# Patient Record
Sex: Female | Born: 1950 | Race: White | Hispanic: No | Marital: Single | Smoking: Never smoker
Health system: Southern US, Community
[De-identification: ages and names within clinical notes are randomized; demographics above are authoritative.]

## PROBLEM LIST (undated history)

## (undated) DIAGNOSIS — T753XXA Motion sickness, initial encounter: Secondary | ICD-10-CM

## (undated) DIAGNOSIS — K219 Gastro-esophageal reflux disease without esophagitis: Secondary | ICD-10-CM

## (undated) DIAGNOSIS — M722 Plantar fascial fibromatosis: Secondary | ICD-10-CM

## (undated) DIAGNOSIS — R519 Headache, unspecified: Secondary | ICD-10-CM

## (undated) DIAGNOSIS — R569 Unspecified convulsions: Secondary | ICD-10-CM

## (undated) DIAGNOSIS — F32A Depression, unspecified: Secondary | ICD-10-CM

## (undated) DIAGNOSIS — F329 Major depressive disorder, single episode, unspecified: Secondary | ICD-10-CM

## (undated) DIAGNOSIS — R42 Dizziness and giddiness: Secondary | ICD-10-CM

## (undated) DIAGNOSIS — F419 Anxiety disorder, unspecified: Secondary | ICD-10-CM

## (undated) DIAGNOSIS — I10 Essential (primary) hypertension: Secondary | ICD-10-CM

## (undated) DIAGNOSIS — R002 Palpitations: Secondary | ICD-10-CM

## (undated) DIAGNOSIS — M1990 Unspecified osteoarthritis, unspecified site: Secondary | ICD-10-CM

## (undated) HISTORY — DX: Palpitations: R00.2

## (undated) HISTORY — PX: TUBAL LIGATION: SHX77

## (undated) HISTORY — PX: BREAST REDUCTION SURGERY: SHX8

## (undated) HISTORY — DX: Essential (primary) hypertension: I10

## (undated) HISTORY — PX: REDUCTION MAMMAPLASTY: SUR839

## (undated) HISTORY — DX: Unspecified convulsions: R56.9

---

## 2018-12-11 ENCOUNTER — Other Ambulatory Visit: Payer: Self-pay

## 2018-12-11 ENCOUNTER — Encounter (HOSPITAL_COMMUNITY): Payer: Self-pay | Admitting: *Deleted

## 2018-12-11 ENCOUNTER — Emergency Department (HOSPITAL_COMMUNITY)
Admission: EM | Admit: 2018-12-11 | Discharge: 2018-12-11 | Disposition: A | Discharge status: Home / Self Care | Payer: Medicare HMO | Attending: Emergency Medicine | Admitting: Emergency Medicine

## 2018-12-11 DIAGNOSIS — F419 Anxiety disorder, unspecified: Secondary | ICD-10-CM | POA: Diagnosis not present

## 2018-12-11 DIAGNOSIS — F329 Major depressive disorder, single episode, unspecified: Secondary | ICD-10-CM | POA: Diagnosis not present

## 2018-12-11 DIAGNOSIS — R21 Rash and other nonspecific skin eruption: Secondary | ICD-10-CM | POA: Diagnosis present

## 2018-12-11 HISTORY — DX: Dizziness and giddiness: R42

## 2018-12-11 HISTORY — DX: Major depressive disorder, single episode, unspecified: F32.9

## 2018-12-11 HISTORY — DX: Plantar fascial fibromatosis: M72.2

## 2018-12-11 HISTORY — DX: Depression, unspecified: F32.A

## 2018-12-11 HISTORY — DX: Motion sickness, initial encounter: T75.3XXA

## 2018-12-11 HISTORY — DX: Anxiety disorder, unspecified: F41.9

## 2018-12-11 MED ORDER — HYDROXYZINE HCL 25 MG PO TABS: 25.0000 mg | ORAL_TABLET | Freq: Four times a day (QID) | ORAL | 0 refills | Status: DC

## 2018-12-11 MED ORDER — DEXAMETHASONE SODIUM PHOSPHATE 10 MG/ML IJ SOLN
10.0000 mg | Freq: Once | INTRAMUSCULAR | Status: AC
  Administered 2018-12-11: 10 mg via INTRAMUSCULAR
  Filled 2018-12-11: qty 1

## 2018-12-11 NOTE — Discharge Instructions (Signed)
Take Hydroxyzine as needed for itching Please follow up with primary care doctor

## 2018-12-11 NOTE — ED Triage Notes (Signed)
Pt c/o itchy, bumpy rash on buttocks x 1 week. Pt reports the rash started behind her right ear, went to her neck, then her finger, then her stomach and now on her buttocks. Pt has used Hydrocortisone and Benadryl which helps the itch "a little".

## 2018-12-11 NOTE — ED Provider Notes (Signed)
[facility] EMERGENCY DEPARTMENT Provider Note   CSN: [id number] Arrival date & time: 12/11/18  1511     History   Chief Complaint Chief Complaint  Patient presents with  . Rash    HPI Kathryn Compton is a 68 y.o. female who presents with a rash.  No significant past medical history.  The patient states that she has been in [place] for the past several months and is considering moving here from [place].  She states that for the past week she has developed a rash on various areas of her body.  For started behind her right ear and then spread to her left ear, her left hand, and now is a worse on her left buttock.  The rash is very itchy.  She tried over-the-counter topical medicines which provided mild temporary relief however the itching soon return.  She diet denies any fever or pain.  She is never had this before.  She is unsure of any exposures to possible allergens.  HPI  Past Medical History:  Diagnosis Date  . Anxiety   . Depression   . Motion sickness   . Plantar fasciitis   . Vertigo     There are no active problems to display for this patient.   Past Surgical History:  Procedure Laterality Date  . BREAST REDUCTION SURGERY       OB History   No obstetric history on file.      Home Medications    Prior to Admission medications   Medication Sig Start Date End Date Taking? Authorizing Provider  hydrOXYzine (ATARAX/VISTARIL) 25 MG tablet Take 1 tablet (25 mg total) by mouth every 6 (six) hours. 12/11/18   Recardo Evangelist, PA-C    Family History No family history on file.  Social History Social History   Tobacco Use  . Smoking status: Never Smoker  . Smokeless tobacco: Never Used  Substance Use Topics  . Alcohol use: Never    Frequency: Never  . Drug use: Never     Allergies   Other   Review of Systems Review of Systems  Constitutional: Negative for fever.  Skin: Positive for rash.     Physical Exam Updated Vital Signs BP (!)  159/83 (BP Location: Right Arm)   Pulse 77   Temp 97.9 F (36.6 C) (Oral)   Resp 20   Ht 5\' 7"  (1.702 m)   Wt 114.9 kg   SpO2 95%   BMI 39.69 kg/m   Physical Exam Vitals signs and nursing note reviewed.  Constitutional:      General: She is not in acute distress.    Appearance: She is well-developed. She is obese.     Comments: Calm and cooperative. Scratching frequently  HENT:     Head: Normocephalic and atraumatic.  Eyes:     General: No scleral icterus.       Right eye: No discharge.        Left eye: No discharge.     Conjunctiva/sclera: Conjunctivae normal.     Pupils: Pupils are equal, round, and reactive to light.  Neck:     Musculoskeletal: Normal range of motion.  Cardiovascular:     Rate and Rhythm: Normal rate.  Pulmonary:     Effort: Pulmonary effort is normal. No respiratory distress.  Abdominal:     General: There is no distension.  Skin:    General: Skin is warm and dry.     Findings: Rash (Erythematous, raised areas behind the right  ear, over the left middle finger, over the buttocks) present.  Neurological:     Mental Status: She is alert and oriented to person, place, and time.  Psychiatric:        Behavior: Behavior normal.      ED Treatments / Results  Labs (all labs ordered are listed, but only abnormal results are displayed) Labs Reviewed - No data to display  EKG None  Radiology No results found.  Procedures Procedures (including critical care time)  Medications Ordered in ED Medications  dexamethasone (DECADRON) injection 10 mg (has no administration in time range)     Initial Impression / Assessment and Plan / ED Course  I have reviewed the triage vital signs and the nursing notes.  Pertinent labs & imaging results that were available during my care of the patient were reviewed by me and considered in my medical decision making (see chart for details).  68 year old female with nonspecific rash for the past week.  It is itchy  and consistent with a contact dermatitis.  She is hypertensive but otherwise vital signs are normal.  She is well-appearing.  She got some relief with over-the-counter steroids therefore we will give her IM injection of Decadron today and prescription for Atarax.  She was encouraged to establish care with a primary care physician.  Final Clinical Impressions(s) / ED Diagnoses   Final diagnoses:  Rash and nonspecific skin eruption    ED Discharge Orders         Ordered    hydrOXYzine (ATARAX/VISTARIL) 25 MG tablet  Every 6 hours     12/11/18 1650           Recardo Evangelist, PA-C 12/11/18 1655    Nat Christen, MD 12/11/18 2316

## 2019-05-21 DIAGNOSIS — Z0100 Encounter for examination of eyes and vision without abnormal findings: Secondary | ICD-10-CM | POA: Diagnosis not present

## 2019-05-21 DIAGNOSIS — H5200 Hypermetropia, unspecified eye: Secondary | ICD-10-CM | POA: Diagnosis not present

## 2019-05-24 DIAGNOSIS — M545 Low back pain: Secondary | ICD-10-CM | POA: Diagnosis not present

## 2019-05-24 DIAGNOSIS — M9905 Segmental and somatic dysfunction of pelvic region: Secondary | ICD-10-CM | POA: Diagnosis not present

## 2019-05-24 DIAGNOSIS — M9903 Segmental and somatic dysfunction of lumbar region: Secondary | ICD-10-CM | POA: Diagnosis not present

## 2019-05-24 DIAGNOSIS — M9902 Segmental and somatic dysfunction of thoracic region: Secondary | ICD-10-CM | POA: Diagnosis not present

## 2019-05-26 DIAGNOSIS — M9905 Segmental and somatic dysfunction of pelvic region: Secondary | ICD-10-CM | POA: Diagnosis not present

## 2019-05-26 DIAGNOSIS — M9902 Segmental and somatic dysfunction of thoracic region: Secondary | ICD-10-CM | POA: Diagnosis not present

## 2019-05-26 DIAGNOSIS — M545 Low back pain: Secondary | ICD-10-CM | POA: Diagnosis not present

## 2019-05-26 DIAGNOSIS — M9903 Segmental and somatic dysfunction of lumbar region: Secondary | ICD-10-CM | POA: Diagnosis not present

## 2019-05-31 DIAGNOSIS — M9903 Segmental and somatic dysfunction of lumbar region: Secondary | ICD-10-CM | POA: Diagnosis not present

## 2019-05-31 DIAGNOSIS — M545 Low back pain: Secondary | ICD-10-CM | POA: Diagnosis not present

## 2019-05-31 DIAGNOSIS — M9902 Segmental and somatic dysfunction of thoracic region: Secondary | ICD-10-CM | POA: Diagnosis not present

## 2019-05-31 DIAGNOSIS — M9905 Segmental and somatic dysfunction of pelvic region: Secondary | ICD-10-CM | POA: Diagnosis not present

## 2019-06-07 DIAGNOSIS — M9903 Segmental and somatic dysfunction of lumbar region: Secondary | ICD-10-CM | POA: Diagnosis not present

## 2019-06-07 DIAGNOSIS — M9902 Segmental and somatic dysfunction of thoracic region: Secondary | ICD-10-CM | POA: Diagnosis not present

## 2019-06-07 DIAGNOSIS — M9905 Segmental and somatic dysfunction of pelvic region: Secondary | ICD-10-CM | POA: Diagnosis not present

## 2019-06-07 DIAGNOSIS — M545 Low back pain: Secondary | ICD-10-CM | POA: Diagnosis not present

## 2019-06-17 DIAGNOSIS — I10 Essential (primary) hypertension: Secondary | ICD-10-CM | POA: Diagnosis not present

## 2019-06-17 DIAGNOSIS — J309 Allergic rhinitis, unspecified: Secondary | ICD-10-CM | POA: Diagnosis not present

## 2019-06-29 DIAGNOSIS — R7309 Other abnormal glucose: Secondary | ICD-10-CM | POA: Diagnosis not present

## 2019-06-29 DIAGNOSIS — I10 Essential (primary) hypertension: Secondary | ICD-10-CM | POA: Diagnosis not present

## 2019-06-29 DIAGNOSIS — Z0001 Encounter for general adult medical examination with abnormal findings: Secondary | ICD-10-CM | POA: Diagnosis not present

## 2019-07-07 ENCOUNTER — Other Ambulatory Visit (HOSPITAL_COMMUNITY): Payer: Self-pay | Admitting: Internal Medicine

## 2019-07-07 DIAGNOSIS — Z1389 Encounter for screening for other disorder: Secondary | ICD-10-CM | POA: Diagnosis not present

## 2019-07-07 DIAGNOSIS — Z0001 Encounter for general adult medical examination with abnormal findings: Secondary | ICD-10-CM | POA: Diagnosis not present

## 2019-07-07 DIAGNOSIS — I10 Essential (primary) hypertension: Secondary | ICD-10-CM | POA: Diagnosis not present

## 2019-07-07 DIAGNOSIS — Z1331 Encounter for screening for depression: Secondary | ICD-10-CM | POA: Diagnosis not present

## 2019-07-07 DIAGNOSIS — Z1231 Encounter for screening mammogram for malignant neoplasm of breast: Secondary | ICD-10-CM

## 2019-07-08 ENCOUNTER — Other Ambulatory Visit (HOSPITAL_COMMUNITY): Payer: Self-pay | Admitting: Internal Medicine

## 2019-07-08 DIAGNOSIS — Z780 Asymptomatic menopausal state: Secondary | ICD-10-CM

## 2019-07-15 ENCOUNTER — Encounter: Payer: Self-pay | Admitting: *Deleted

## 2019-07-16 ENCOUNTER — Ambulatory Visit (HOSPITAL_COMMUNITY)
Admission: RE | Admit: 2019-07-16 | Discharge: 2019-07-16 | Disposition: A | Discharge status: Home / Self Care | Payer: Medicare HMO | Source: Ambulatory Visit | Attending: Internal Medicine | Admitting: Internal Medicine

## 2019-07-16 ENCOUNTER — Inpatient Hospital Stay (HOSPITAL_COMMUNITY): Admission: RE | Admit: 2019-07-16 | Payer: Medicare HMO | Source: Ambulatory Visit

## 2019-07-16 ENCOUNTER — Ambulatory Visit (HOSPITAL_COMMUNITY): Payer: Medicare HMO

## 2019-07-16 ENCOUNTER — Other Ambulatory Visit (HOSPITAL_COMMUNITY): Payer: Medicare HMO

## 2019-07-16 ENCOUNTER — Encounter (HOSPITAL_COMMUNITY): Payer: Self-pay

## 2019-07-16 ENCOUNTER — Other Ambulatory Visit: Payer: Self-pay

## 2019-07-16 DIAGNOSIS — Z1231 Encounter for screening mammogram for malignant neoplasm of breast: Secondary | ICD-10-CM | POA: Diagnosis not present

## 2019-07-16 DIAGNOSIS — Z780 Asymptomatic menopausal state: Secondary | ICD-10-CM | POA: Insufficient documentation

## 2019-07-16 DIAGNOSIS — M85851 Other specified disorders of bone density and structure, right thigh: Secondary | ICD-10-CM | POA: Diagnosis not present

## 2019-07-16 DIAGNOSIS — I10 Essential (primary) hypertension: Secondary | ICD-10-CM | POA: Diagnosis not present

## 2019-07-16 DIAGNOSIS — B009 Herpesviral infection, unspecified: Secondary | ICD-10-CM | POA: Diagnosis not present

## 2019-07-27 ENCOUNTER — Other Ambulatory Visit: Payer: Self-pay

## 2019-07-27 ENCOUNTER — Ambulatory Visit (INDEPENDENT_AMBULATORY_CARE_PROVIDER_SITE_OTHER): Payer: Medicare HMO | Admitting: *Deleted

## 2019-07-27 DIAGNOSIS — Z1211 Encounter for screening for malignant neoplasm of colon: Secondary | ICD-10-CM

## 2019-07-27 MED ORDER — PEG 3350-KCL-NA BICARB-NACL 420 G PO SOLR: 4000.0000 mL | Freq: Once | ORAL | 0 refills | Status: AC

## 2019-07-27 NOTE — Patient Instructions (Signed)
Makisha Marrin   [birth year] MRN: [id number]    Procedure Date: 09/06/2019 Time to register: 1:00 pm Place to register: [facility] Procedure Time: 2:00 pm Scheduled provider: Dr. Oneida Alar  PREPARATION FOR COLONOSCOPY WITH TRI-LYTE SPLIT PREP  Please notify us immediately if you are diabetic, take iron supplements, or if you are on Coumadin or any other blood thinners.   You will need to purchase 1 fleet enema and 1 box of Bisacodyl 30m tablets.  1 DAY BEFORE PROCEDURE:  DATE: 09/05/2019   DAY: Sunday Continue clear liquids the entire day - NO SOLID FOOD.    At 2:00 pm:  Take 2 Bisacodyl tablets.   At 4:00pm:  Start drinking your solution. Make sure you mix well per instructions on the bottle. Try to drink 1 (one) 8 ounce glass every 10-15 minutes until you have consumed HALF the jug. You should complete by 6:00pm.You must keep the left over solution refrigerated until completed next day.  Continue clear liquids. You must drink plenty of clear liquids to prevent dehyration and kidney failure.     DAY OF PROCEDURE:   DATE: 09/06/2019   DAY: Monday If you take medications for your heart, blood pressure or breathing, you may take these medications.   Five hours before your procedure time @ 9:00 am:  Finish remaining amout of bowel prep, drinking 1 (one) 8 ounce glass every 10-15 minutes until complete. You have two hours to consume remaining prep.   Three hours before your procedure time @ 11:00 am:  Nothing by mouth.   At least one hour before going to the hospital:  Give yourself one Fleet enema. You may take your morning medications with sip of water unless we have instructed otherwise.      Please see below for Dietary Information.  CLEAR LIQUIDS INCLUDE:  Water Jello (NOT red in color)   Ice Popsicles (NOT red in color)   Tea (sugar ok, no milk/cream) Powdered fruit flavored drinks  Coffee (sugar ok, no milk/cream) Gatorade/ Lemonade/ Kool-Aid  (NOT red in  color)   Juice: apple, white grape, white cranberry Soft drinks  Clear bullion, consomme, broth (fat free beef/chicken/vegetable)  Carbonated beverages (any kind)  Strained chicken noodle soup Hard Candy   Remember: Clear liquids are liquids that will allow you to see your fingers on the other side of a clear glass. Be sure liquids are NOT red in color, and not cloudy, but CLEAR.  DO NOT EAT OR DRINK ANY OF THE FOLLOWING:  Dairy products of any kind   Cranberry juice Tomato juice / V8 juice   Grapefruit juice Orange juice     Red grape juice  Do not eat any solid foods, including such foods as: cereal, oatmeal, yogurt, fruits, vegetables, creamed soups, eggs, bread, crackers, pureed foods in a blender, etc.   HELPFUL HINTS FOR DRINKING PREP SOLUTION:   Make sure prep is extremely cold. Mix and refrigerate the the morning of the prep. You may also put in the freezer.   You may try mixing some Crystal Light or Country Time Lemonade if you prefer. Mix in small amounts; add more if necessary.  Try drinking through a straw  Rinse mouth with water or a mouthwash between glasses, to remove after-taste.  Try sipping on a cold beverage /ice/ popsicles between glasses of prep.  Place a piece of sugar-free hard candy in mouth between glasses.  If you become nauseated, try consuming smaller amounts, or stretch out the time  between glasses. Stop for 30-60 minutes, then slowly start back drinking.        OTHER INSTRUCTIONS  You will need a responsible adult at least 66 years of age to accompany you and drive you home. This person must remain in the waiting room during your procedure. The hospital will cancel your procedure if you do not have a responsible adult with you.   1. Wear loose fitting clothing that is easily removed. 2. Leave jewelry and other valuables at home.  3. Remove all body piercing jewelry and leave at home. 4. Total time from sign-in until discharge is approximately  2-3 hours. 5. You should go home directly after your procedure and rest. You can resume normal activities the day after your procedure. 6. The day of your procedure you should not:  Drive  Make legal decisions  Operate machinery  Drink alcohol  Return to work   You may call the office (Dept: [phone]) before 5:00pm, or page the doctor on call [phone]) after 5:00pm, for further instructions, if necessary.   Insurance Information YOU WILL NEED TO CHECK WITH YOUR INSURANCE COMPANY FOR THE BENEFITS OF COVERAGE YOU HAVE FOR THIS PROCEDURE.  UNFORTUNATELY, NOT ALL INSURANCE COMPANIES HAVE BENEFITS TO COVER ALL OR PART OF THESE TYPES OF PROCEDURES.  IT IS YOUR RESPONSIBILITY TO CHECK YOUR BENEFITS, HOWEVER, WE WILL BE GLAD TO ASSIST YOU WITH ANY CODES YOUR INSURANCE COMPANY MAY NEED.    PLEASE NOTE THAT MOST INSURANCE COMPANIES WILL NOT COVER A SCREENING COLONOSCOPY FOR PEOPLE UNDER THE AGE OF 50  IF YOU HAVE BCBS INSURANCE, YOU MAY HAVE BENEFITS FOR A SCREENING COLONOSCOPY BUT IF POLYPS ARE FOUND THE DIAGNOSIS WILL CHANGE AND THEN YOU MAY HAVE A DEDUCTIBLE THAT WILL NEED TO BE MET. SO PLEASE MAKE SURE YOU CHECK YOUR BENEFITS FOR A SCREENING COLONOSCOPY AS WELL AS A DIAGNOSTIC COLONOSCOPY.

## 2019-07-27 NOTE — Progress Notes (Signed)
Gastroenterology Pre-Procedure Review  Request Date: 07/27/2019 Requesting Physician: Dr. Legrand Rams, No previous TCS  PATIENT REVIEW QUESTIONS: The patient responded to the following health history questions as indicated:    1. Diabetes Melitis: No 2. Joint replacements in the past 12 months: No 3. Major health problems in the past 3 months: No 4. Has an artificial valve or MVP: No 5. Has a defibrillator: No 6. Has been advised in past to take antibiotics in advance of a procedure like teeth cleaning: No 7. Family history of colon cancer: No  8. Alcohol Use: No 9. History of sleep apnea: No  10. History of coronary artery or other vascular stents placed within the last 12 months: No 11. History of any prior anesthesia complications: No    MEDICATIONS & ALLERGIES:    Patient reports the following regarding taking any blood thinners:   Plavix? No Aspirin? No Coumadin? No Brilinta? No Xarelto? No Eliquis? No Pradaxa? No Savaysa? No Effient? No  Patient confirms/reports the following medications:  Current Outpatient Medications  Medication Sig Dispense Refill  . cetirizine (ZYRTEC) 10 MG tablet Take 10 mg by mouth daily.    Marland Kitchen ibuprofen (ADVIL) 200 MG tablet Take 200 mg by mouth as needed.    . Melatonin 10 MG TABS Take by mouth at bedtime.    . Multiple Vitamins-Minerals (AIRBORNE PO) Take by mouth.     No current facility-administered medications for this visit.     Patient confirms/reports the following allergies:  Allergies  Allergen Reactions  . Other Itching and Other (See Comments)    Antidepressants-Itching, Gaining Weight     No orders of the defined types were placed in this encounter.   AUTHORIZATION INFORMATION Primary Insurance: Salli Quarry,  [place] #: [id number] Pre-Cert / Josem Kaufmann required: No, not required  SCHEDULE INFORMATION: Procedure has been scheduled as follows:  Date: 09/06/2019, Time: 2:00 Location: [facility] with Dr. Oneida Alar  This Gastroenterology  Pre-Precedure Review Form is being routed to the following provider(s): Neil Crouch, PA-C

## 2019-07-29 ENCOUNTER — Telehealth: Payer: Self-pay | Admitting: Advanced Practice Midwife

## 2019-07-29 NOTE — Telephone Encounter (Signed)

## 2019-07-30 ENCOUNTER — Encounter: Payer: Self-pay | Admitting: Advanced Practice Midwife

## 2019-07-30 ENCOUNTER — Other Ambulatory Visit (HOSPITAL_COMMUNITY)
Admission: RE | Admit: 2019-07-30 | Discharge: 2019-07-30 | Disposition: A | Discharge status: Home / Self Care | Payer: Medicare HMO | Source: Ambulatory Visit | Attending: Advanced Practice Midwife | Admitting: Advanced Practice Midwife

## 2019-07-30 ENCOUNTER — Ambulatory Visit (INDEPENDENT_AMBULATORY_CARE_PROVIDER_SITE_OTHER): Payer: Medicare HMO | Admitting: Advanced Practice Midwife

## 2019-07-30 ENCOUNTER — Other Ambulatory Visit: Payer: Self-pay

## 2019-07-30 VITALS — Ht 67.0 in | Wt 258.0 lb

## 2019-07-30 DIAGNOSIS — M545 Low back pain: Secondary | ICD-10-CM | POA: Diagnosis not present

## 2019-07-30 DIAGNOSIS — Z124 Encounter for screening for malignant neoplasm of cervix: Secondary | ICD-10-CM | POA: Insufficient documentation

## 2019-07-30 DIAGNOSIS — M9903 Segmental and somatic dysfunction of lumbar region: Secondary | ICD-10-CM | POA: Diagnosis not present

## 2019-07-30 DIAGNOSIS — M9905 Segmental and somatic dysfunction of pelvic region: Secondary | ICD-10-CM | POA: Diagnosis not present

## 2019-07-30 DIAGNOSIS — M9902 Segmental and somatic dysfunction of thoracic region: Secondary | ICD-10-CM | POA: Diagnosis not present

## 2019-07-30 DIAGNOSIS — Z780 Asymptomatic menopausal state: Secondary | ICD-10-CM | POA: Insufficient documentation

## 2019-07-30 DIAGNOSIS — Z1151 Encounter for screening for human papillomavirus (HPV): Secondary | ICD-10-CM | POA: Diagnosis not present

## 2019-07-30 NOTE — Progress Notes (Signed)
Ok to schedule.

## 2019-07-30 NOTE — Progress Notes (Signed)
[facility] Visit  Patient name: Kathryn Compton MRN [id number]  Date of birth: 1951-08-29  CC & HPI:  Kathryn Compton is a 68 y.o. Caucasian female presenting today for pap only.  Last pap 2014, normal. [facility] is PCP, did wellness visit but no pelvic  Pt getting colonoscopy in October. Mammogram this month was normal  Pertinent History Reviewed:  Medical & Surgical Hx:   Past Medical History:  Diagnosis Date  . Anxiety   . Depression   . Hypertension   . Motion sickness   . Plantar fasciitis   . Vertigo    Past Surgical History:  Procedure Laterality Date  . BREAST REDUCTION SURGERY    . REDUCTION MAMMAPLASTY    . TUBAL LIGATION     History reviewed. No pertinent family history.  Current Outpatient Medications:  .  cetirizine (ZYRTEC) 10 MG tablet, Take 10 mg by mouth daily., Disp: , Rfl:  .  Melatonin 10 MG TABS, Take by mouth at bedtime., Disp: , Rfl:  .  Multiple Vitamins-Minerals (AIRBORNE PO), Take by mouth., Disp: , Rfl:  .  ibuprofen (ADVIL) 200 MG tablet, Take 200 mg by mouth as needed., Disp: , Rfl:  Social History: Reviewed -  reports that she has never smoked. She has never used smokeless tobacco.  Review of Systems:   Constitutional: Negative for fever and chills Eyes: Negative for visual disturbances Respiratory: Negative for shortness of breath, dyspnea Cardiovascular: Negative for chest pain or palpitations  Gastrointestinal: Negative for vomiting, diarrhea and constipation; no abdominal pain Genitourinary: Negative for dysuria and urgency, vaginal irritation or itching Musculoskeletal: Negative for back pain, joint pain, myalgias  Neurological: Negative for dizziness and headaches    Objective Findings:    Physical Examination: There were no vitals filed for this visit. General appearance - well appearing, and in no distress Mental status - alert, oriented to person, place, and time Chest:  Normal respiratory effort Heart - normal  rate and regular rhythm Abdomen:  Soft, nontender Pelvic: SSE:  Normal appearing vagina, cervix.  Pap collected. Bimanual normal, no pain, ovaries not palpable. Musculoskeletal:  Normal range of motion without pain except for hip which is bothering her today.  Extremities:  No edema    No results found for this or any previous visit (from the past 24 hour(s)).    Assessment & Plan:  A:   Pap screening P:     Return in about 4 years (around 07/30/2023) for pap.  Christin Fudge CNM 07/30/2019 12:28 PM

## 2019-08-02 NOTE — Addendum Note (Signed)
Addended by: Metro Kung on: 08/02/2019 07:37 AM   Modules accepted: Orders, SmartSet

## 2019-08-06 LAB — CYTOLOGY - PAP
Diagnosis: NEGATIVE
HPV 16/18/45 genotyping: NEGATIVE
HPV: DETECTED — AB

## 2019-08-16 DIAGNOSIS — D235 Other benign neoplasm of skin of trunk: Secondary | ICD-10-CM | POA: Diagnosis not present

## 2019-08-16 DIAGNOSIS — B0089 Other herpesviral infection: Secondary | ICD-10-CM | POA: Diagnosis not present

## 2019-08-16 DIAGNOSIS — D3617 Benign neoplasm of peripheral nerves and autonomic nervous system of trunk, unspecified: Secondary | ICD-10-CM | POA: Diagnosis not present

## 2019-09-03 ENCOUNTER — Other Ambulatory Visit: Payer: Self-pay

## 2019-09-03 ENCOUNTER — Other Ambulatory Visit (HOSPITAL_COMMUNITY)
Admission: RE | Admit: 2019-09-03 | Discharge: 2019-09-03 | Disposition: A | Discharge status: Home / Self Care | Payer: Medicare HMO | Source: Ambulatory Visit | Attending: Gastroenterology | Admitting: Gastroenterology

## 2019-09-03 DIAGNOSIS — Z20828 Contact with and (suspected) exposure to other viral communicable diseases: Secondary | ICD-10-CM | POA: Insufficient documentation

## 2019-09-03 DIAGNOSIS — Z01812 Encounter for preprocedural laboratory examination: Secondary | ICD-10-CM | POA: Diagnosis not present

## 2019-09-03 LAB — SARS CORONAVIRUS 2 (TAT 6-24 HRS): SARS Coronavirus 2: NEGATIVE

## 2019-09-06 ENCOUNTER — Encounter (HOSPITAL_COMMUNITY)
Admission: RE | Disposition: A | Discharge status: Home / Self Care | Payer: Self-pay | Source: Home / Self Care | Attending: Gastroenterology

## 2019-09-06 ENCOUNTER — Ambulatory Visit (HOSPITAL_COMMUNITY)
Admission: RE | Admit: 2019-09-06 | Discharge: 2019-09-06 | Disposition: A | Discharge status: Home / Self Care | Payer: Medicare HMO | Attending: Gastroenterology | Admitting: Gastroenterology

## 2019-09-06 ENCOUNTER — Other Ambulatory Visit: Payer: Self-pay

## 2019-09-06 ENCOUNTER — Encounter (HOSPITAL_COMMUNITY): Payer: Self-pay | Admitting: *Deleted

## 2019-09-06 DIAGNOSIS — Q438 Other specified congenital malformations of intestine: Secondary | ICD-10-CM | POA: Diagnosis not present

## 2019-09-06 DIAGNOSIS — K635 Polyp of colon: Secondary | ICD-10-CM | POA: Diagnosis not present

## 2019-09-06 DIAGNOSIS — D125 Benign neoplasm of sigmoid colon: Secondary | ICD-10-CM | POA: Insufficient documentation

## 2019-09-06 DIAGNOSIS — K648 Other hemorrhoids: Secondary | ICD-10-CM | POA: Diagnosis not present

## 2019-09-06 DIAGNOSIS — K644 Residual hemorrhoidal skin tags: Secondary | ICD-10-CM | POA: Insufficient documentation

## 2019-09-06 DIAGNOSIS — Z1211 Encounter for screening for malignant neoplasm of colon: Secondary | ICD-10-CM | POA: Diagnosis not present

## 2019-09-06 DIAGNOSIS — I10 Essential (primary) hypertension: Secondary | ICD-10-CM | POA: Diagnosis not present

## 2019-09-06 HISTORY — PX: POLYPECTOMY: SHX5525

## 2019-09-06 HISTORY — PX: COLONOSCOPY: SHX5424

## 2019-09-06 SURGERY — COLONOSCOPY
Anesthesia: Moderate Sedation

## 2019-09-06 MED ORDER — MEPERIDINE HCL 100 MG/ML IJ SOLN
INTRAMUSCULAR | Status: AC
  Filled 2019-09-06: qty 2

## 2019-09-06 MED ORDER — SODIUM CHLORIDE 0.9 % IV SOLN
INTRAVENOUS | Status: DC
  Administered 2019-09-06: 13:00:00 via INTRAVENOUS

## 2019-09-06 MED ORDER — MIDAZOLAM HCL 5 MG/5ML IJ SOLN
INTRAMUSCULAR | Status: DC | PRN
  Administered 2019-09-06 (×3): 2 mg via INTRAVENOUS

## 2019-09-06 MED ORDER — STERILE WATER FOR IRRIGATION IR SOLN
Status: DC | PRN
  Administered 2019-09-06: 1.5 mL

## 2019-09-06 MED ORDER — MEPERIDINE HCL 100 MG/ML IJ SOLN
INTRAMUSCULAR | Status: DC | PRN
  Administered 2019-09-06 (×3): 25 mg via INTRAVENOUS

## 2019-09-06 MED ORDER — MIDAZOLAM HCL 5 MG/5ML IJ SOLN
INTRAMUSCULAR | Status: AC
  Filled 2019-09-06: qty 10

## 2019-09-06 NOTE — Discharge Instructions (Signed)
You have internal AND EXTERNAL hemorrhoids. YOU HAD ONE POLYP REMOVED.    CONTINUE YOUR WEIGHT LOSS EFFORTS. YOUR BODY MASS INDEX IS OVER 40 WHICH MEANS YOU ARE MORBIDLY OBESE. OBESITY IS ASSOCIATED WITH AN INCREASED FOR POLYPS, CIRRHOSIS AND ALL CANCERS, INCLUDING ESOPHAGEAL AND COLON CANCER  AND DECREASES YOUR LIFE EXPECTANCY 10 YEARS. I RECOMMEND YOU READ AND FOLLOW RECOMMENDATIONS BY DR. MARK HYMAN, "10-DAY DETOX DIET".  DRINK WATER TO KEEP YOUR URINE LIGHT YELLOW.  FOLLOW A HIGH FIBER DIET. AVOID ITEMS THAT CAUSE BLOATING. SEE INFO BELOW.   USE PREPARATION H FOUR TIMES  A DAY IF NEEDED TO RELIEVE RECTAL PAIN/PRESSURE/BLEEDING.   YOUR BIOPSY RESULTS WILL BE BACK IN 5 BUSINESS DAYS.  Next colonoscopy in 5-10 years.  Colonoscopy Care After Read the instructions outlined below and refer to this sheet in the next week. These discharge instructions provide you with general information on caring for yourself after you leave the hospital. While your treatment has been planned according to the most current medical practices available, unavoidable complications occasionally occur. If you have any problems or questions after discharge, call DR. Margarite Vessel, [phone].  ACTIVITY  You may resume your regular activity, but move at a slower pace for the next 24 hours.   Take frequent rest periods for the next 24 hours.   Walking will help get rid of the air and reduce the bloated feeling in your belly (abdomen).   No driving for 24 hours (because of the medicine (anesthesia) used during the test).   You may shower.   Do not sign any important legal documents or operate any machinery for 24 hours (because of the anesthesia used during the test).    NUTRITION  Drink plenty of fluids.   You may resume your normal diet as instructed by your doctor.   Begin with a light meal and progress to your normal diet. Heavy or fried foods are harder to digest and may make you feel sick to your  stomach (nauseated).   Avoid alcoholic beverages for 24 hours or as instructed.    MEDICATIONS  You may resume your normal medications.   WHAT YOU CAN EXPECT TODAY  Some feelings of bloating in the abdomen.   Passage of more gas than usual.   Spotting of blood in your stool or on the toilet paper  .  IF YOU HAD POLYPS REMOVED DURING THE COLONOSCOPY:  Eat a soft diet IF YOU HAVE NAUSEA, BLOATING, ABDOMINAL PAIN, OR VOMITING.    FINDING OUT THE RESULTS OF YOUR TEST Not all test results are available during your visit. DR. Oneida Alar WILL CALL YOU WITHIN 14 DAYS OF YOUR PROCEDUE WITH YOUR RESULTS. Do not assume everything is normal if you have not heard from DR. Shaylah Mcghie, CALL HER OFFICE AT [phone].  SEEK IMMEDIATE MEDICAL ATTENTION AND CALL THE OFFICE: [phone] IF:  You have more than a spotting of blood in your stool.   Your belly is swollen (abdominal distention).   You are nauseated or vomiting.   You have a temperature over 101F.   You have abdominal pain or discomfort that is severe or gets worse throughout the day.  High-Fiber Diet A high-fiber diet changes your normal diet to include more whole grains, legumes, fruits, and vegetables. Changes in the diet involve replacing refined carbohydrates with unrefined foods. The calorie level of the diet is essentially unchanged. The Dietary Reference Intake (recommended amount) for adult males is 38 grams per day. For adult females, it is 12  grams per day. Pregnant and lactating women should consume 28 grams of fiber per day. Fiber is the intact part of a plant that is not broken down during digestion. Functional fiber is fiber that has been isolated from the plant to provide a beneficial effect in the body.  PURPOSE  Increase stool bulk.   Ease and regulate bowel movements.   Lower cholesterol.   REDUCE RISK OF COLON CANCER  INDICATIONS THAT YOU NEED MORE FIBER  Constipation and hemorrhoids.   Uncomplicated  diverticulosis (intestine condition) and irritable bowel syndrome.   Weight management.   As a protective measure against hardening of the arteries (atherosclerosis), diabetes, and cancer.   GUIDELINES FOR INCREASING FIBER IN THE DIET  Start adding fiber to the diet slowly. A gradual increase of about 5 more grams (2 servings of most fruits or vegetables) per day is best. Too rapid an increase in fiber may result in constipation, flatulence, and bloating.   Drink enough water and fluids to keep your urine clear or pale yellow. Water, juice, or caffeine-free drinks are recommended. Not drinking enough fluid may cause constipation.   Eat a variety of high-fiber foods rather than one type of fiber.   Try to increase your intake of fiber through using high-fiber foods rather than fiber pills or supplements that contain small amounts of fiber.   The goal is to change the types of food eaten. Do not supplement your present diet with high-fiber foods, but replace foods in your present diet.

## 2019-09-06 NOTE — Op Note (Signed)
[facility] Patient Name: Kathryn Compton Procedure Date: 09/06/2019 1:23 PM MRN: [id number] Date of Birth: 04-18-1951 Attending MD: Barney Drain MD, MD CSN: [id number] Age: 68 Admit Type: Outpatient Procedure:                Colonoscopy WITH COLD SNARE POLYPECTOMY Indications:              Screening for colorectal malignant neoplasm Providers:                Barney Drain MD, MD, Charlsie Quest. Theda Sers RN, RN,                            Nelma Rothman, Technician Referring MD:             Rosita Fire MD, MD Medicines:                Meperidine 75 mg IV, Midazolam 6 mg IV Complications:            No immediate complications. Estimated Blood Loss:     Estimated blood loss was minimal. Procedure:                Pre-Anesthesia Assessment:                           - Prior to the procedure, a History and Physical                            was performed, and patient medications and                            allergies were reviewed. The patient's tolerance of                            previous anesthesia was also reviewed. The risks                            and benefits of the procedure and the sedation                            options and risks were discussed with the patient.                            All questions were answered, and informed consent                            was obtained. Prior Anticoagulants: The patient has                            taken no previous anticoagulant or antiplatelet                            agents except for NSAID medication. ASA Grade                            Assessment: II - A patient with mild systemic  disease. After reviewing the risks and benefits,                            the patient was deemed in satisfactory condition to                            undergo the procedure. After obtaining informed                            consent, the colonoscope was passed under direct                            vision.  Throughout the procedure, the patient's                            blood pressure, pulse, and oxygen saturations were                            monitored continuously. The [id number]                            scope was introduced through the anus and advanced                            to the the cecum, identified by appendiceal orifice                            and ileocecal valve. The colonoscopy was somewhat                            difficult due to a tortuous colon. Successful                            completion of the procedure was aided by                            straightening and shortening the scope to obtain                            bowel loop reduction and COLOWRAP. The patient                            tolerated the procedure well. The quality of the                            bowel preparation was good. The ileocecal valve,                            appendiceal orifice, and rectum were photographed. Scope In: 1:58:21 PM Scope Out: 2:17:37 PM Scope Withdrawal Time: 0 hours 16 minutes 35 seconds  Total Procedure Duration: 0 hours 19 minutes 16 seconds  Findings:      A 4 mm polyp was found in the distal sigmoid colon. The polyp was  sessile. The polyp was removed with a cold snare. Resection and       retrieval were complete.      External and internal hemorrhoids were found.      The recto-sigmoid colon and sigmoid colon were mildly tortuous. Impression:               - One 4 mm polyp in the distal sigmoid colon,                            removed with a cold snare. Resected and retrieved.                           - External and inter                           nal hemorrhoids.                           - Tortuous colon. Moderate Sedation:      Moderate (conscious) sedation was administered by the endoscopy nurse       and supervised by the endoscopist. The following parameters were       monitored: oxygen saturation, heart rate, blood pressure,  and response       to care. Total physician intraservice time was 34 minutes. Recommendation:           - Patient has a contact number available for                            emergencies. The signs and symptoms of potential                            delayed complications were discussed with the                            patient. Return to normal activities tomorrow.                            Written discharge instructions were provided to the                            patient.                           - High fiber diet.                           - Continue present medications.                           - Await pathology results.                           - Repeat colonoscopy in 5-10 years for surveillance. Procedure Code(s):        --- Professional ---                           [phone], Colonoscopy, flexible;  with removal of                            tumor(s), polyp(s), or other lesion(s) by snare                            technique                           99153, Moderate sedation; each additional 15                            minutes intraservice time                           G0500, Moderate sedation services provided by the                            same physician or other qualified health care                            professional performing a gastrointestinal                            endoscopic service that sedation supports,                            requiring the presence of an independent trained                            observer to assist in the monitoring of the                            patient's level of consciousness and physiological                            status; initial 15 minutes of intra-service time;                            patient age 46 years or older (additional time may                            be reported with [phone], as appropriate) Diagnosis Code(s):        --- Professional ---                           Z12.11, Encounter for screening for  malignant                            neoplasm of colon                           K63.5, Polyp of colon                           K64.8, Other hemorrhoids  Q43.8, Other specified congenital malformations of                            intestine CPT copyright 2019 [employer]. All rights reserved. The codes documented in this report are preliminary and upon coder review may  be revised to meet current compliance requirements. Barney Drain, MD Barney Drain MD, MD 09/06/2019 2:35:13 PM This report has been signed electronically. Number of Addenda: 0

## 2019-09-06 NOTE — H&P (Signed)
Primary Care Physician:  Rosita Fire, MD Primary Gastroenterologist:  Dr. Oneida Alar  Pre-Procedure History & Physical: HPI:  Kathryn Compton is a 68 y.o. female here for [place].  Past Medical History:  Diagnosis Date  . Anxiety   . Depression   . Hypertension   . Motion sickness   . Plantar fasciitis   . Vertigo     Past Surgical History:  Procedure Laterality Date  . BREAST REDUCTION SURGERY    . REDUCTION MAMMAPLASTY    . TUBAL LIGATION      Prior to Admission medications   Medication Sig Start Date End Date Taking? Authorizing Provider  cetirizine (ZYRTEC) 10 MG tablet Take 10 mg by mouth daily.   Yes [provider]  ibuprofen (ADVIL) 200 MG tablet Take 200 mg by mouth as needed for headache, mild pain or moderate pain.    Yes [provider]  Melatonin 10 MG TABS Take 10 mg by mouth at bedtime as needed.    Yes [provider]    Allergies as of 08/02/2019 - Review Complete 07/30/2019  Allergen Reaction Noted  . Other Itching and Other (See Comments) 12/11/2018    Family History  Problem Relation Age of Onset  . Colon cancer Neg Hx     Social History   Socioeconomic History  . Marital status: Single    Spouse name: Not on file  . Number of children: Not on file  . Years of education: Not on file  . Highest education level: Not on file  Occupational History  . Not on file  Social Needs  . Financial resource strain: Not on file  . Food insecurity    Worry: Not on file    Inability: Not on file  . Transportation needs    Medical: Not on file    Non-medical: Not on file  Tobacco Use  . Smoking status: Never Smoker  . Smokeless tobacco: Never Used  Substance and Sexual Activity  . Alcohol use: Never    Frequency: Never  . Drug use: Never  . Sexual activity: Yes    Birth control/protection: Surgical  Lifestyle  . Physical activity    Days per week: Not on file    Minutes per session: Not on file  .  Stress: Not on file  Relationships  . Social Herbalist on phone: Not on file    Gets together: Not on file    Attends religious service: Not on file    Active member of club or organization: Not on file    Attends meetings of clubs or organizations: Not on file    Relationship status: Not on file  . Intimate partner violence    Fear of current or ex partner: Not on file    Emotionally abused: Not on file    Physically abused: Not on file    Forced sexual activity: Not on file  Other Topics Concern  . Not on file  Social History Narrative   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 SPENDS FREE TIME: WALKS, WRITES(BOOKS/SCREEN PLAY).    Review of Systems: See HPI, otherwise negative ROS   Physical Exam: BP (!) 144/65   Pulse 77   Temp 98.6 F (37 C) (Oral)   Resp 15   Ht 5\' 7"  (1.702 m)   SpO2 100%   BMI 40.41 kg/m  General:   Alert,  pleasant and cooperative in NAD Head:  Normocephalic and atraumatic. Neck:  Supple;  Lungs:  Clear throughout to auscultation.    Heart:  Regular rate and rhythm. Abdomen:  Soft, nontender and nondistended. Normal bowel sounds, without guarding, and without rebound.   Neurologic:  Alert and  oriented x4;  grossly normal neurologically.  Impression/Plan:    SCREENING  Plan:  1. TCS TODAY DISCUSSED PROCEDURE, BENEFITS, & RISKS: < 1% chance of medication reaction, bleeding, perforation, ASPIRATION, or rupture of spleen/liver requiring surgery to fix it and missed polyps < 1 cm 10-20% of the time.

## 2019-09-08 LAB — SURGICAL PATHOLOGY

## 2019-09-09 ENCOUNTER — Telehealth: Payer: Self-pay | Admitting: Gastroenterology

## 2019-09-09 NOTE — Telephone Encounter (Signed)
Please call pt. She had ONE polypoid lesion, removed and it was benign.    CONTINUE YOUR WEIGHT LOSS EFFORTS.  READ AND FOLLOW RECOMMENDATIONS BY DR. MARK HYMAN, "10-DAY DETOX DIET". DRINK WATER TO KEEP YOUR URINE LIGHT YELLOW. FOLLOW A HIGH FIBER DIET. AVOID ITEMS THAT CAUSE BLOATING.  USE PREPARATION H FOUR TIMES  A DAY IF NEEDED TO RELIEVE RECTAL PAIN/PRESSURE/BLEEDING. Next colonoscopy in 10 years.

## 2019-09-13 ENCOUNTER — Encounter (HOSPITAL_COMMUNITY): Payer: Self-pay | Admitting: Gastroenterology

## 2019-09-14 NOTE — Telephone Encounter (Signed)
Called and could not leave a vm.

## 2019-09-16 NOTE — Telephone Encounter (Signed)
PT is aware of results and plan.  

## 2019-09-16 NOTE — Telephone Encounter (Signed)
LMOM to call.

## 2019-10-11 DIAGNOSIS — L308 Other specified dermatitis: Secondary | ICD-10-CM | POA: Diagnosis not present

## 2019-10-11 DIAGNOSIS — L233 Allergic contact dermatitis due to drugs in contact with skin: Secondary | ICD-10-CM | POA: Diagnosis not present

## 2019-10-11 DIAGNOSIS — L0100 Impetigo, unspecified: Secondary | ICD-10-CM | POA: Diagnosis not present

## 2019-11-04 DIAGNOSIS — L700 Acne vulgaris: Secondary | ICD-10-CM | POA: Diagnosis not present

## 2019-11-24 DIAGNOSIS — M545 Low back pain: Secondary | ICD-10-CM | POA: Diagnosis not present

## 2019-11-24 DIAGNOSIS — M9905 Segmental and somatic dysfunction of pelvic region: Secondary | ICD-10-CM | POA: Diagnosis not present

## 2019-11-24 DIAGNOSIS — M9903 Segmental and somatic dysfunction of lumbar region: Secondary | ICD-10-CM | POA: Diagnosis not present

## 2019-11-24 DIAGNOSIS — M9902 Segmental and somatic dysfunction of thoracic region: Secondary | ICD-10-CM | POA: Diagnosis not present

## 2019-12-01 DIAGNOSIS — M9902 Segmental and somatic dysfunction of thoracic region: Secondary | ICD-10-CM | POA: Diagnosis not present

## 2019-12-01 DIAGNOSIS — M9903 Segmental and somatic dysfunction of lumbar region: Secondary | ICD-10-CM | POA: Diagnosis not present

## 2019-12-01 DIAGNOSIS — M545 Low back pain: Secondary | ICD-10-CM | POA: Diagnosis not present

## 2019-12-01 DIAGNOSIS — M9905 Segmental and somatic dysfunction of pelvic region: Secondary | ICD-10-CM | POA: Diagnosis not present

## 2019-12-06 DIAGNOSIS — I10 Essential (primary) hypertension: Secondary | ICD-10-CM | POA: Diagnosis not present

## 2019-12-06 DIAGNOSIS — F331 Major depressive disorder, recurrent, moderate: Secondary | ICD-10-CM | POA: Diagnosis not present

## 2019-12-24 DIAGNOSIS — M9905 Segmental and somatic dysfunction of pelvic region: Secondary | ICD-10-CM | POA: Diagnosis not present

## 2019-12-24 DIAGNOSIS — M545 Low back pain: Secondary | ICD-10-CM | POA: Diagnosis not present

## 2019-12-24 DIAGNOSIS — M9902 Segmental and somatic dysfunction of thoracic region: Secondary | ICD-10-CM | POA: Diagnosis not present

## 2019-12-24 DIAGNOSIS — M9903 Segmental and somatic dysfunction of lumbar region: Secondary | ICD-10-CM | POA: Diagnosis not present

## 2020-01-21 DIAGNOSIS — M9905 Segmental and somatic dysfunction of pelvic region: Secondary | ICD-10-CM | POA: Diagnosis not present

## 2020-01-21 DIAGNOSIS — M545 Low back pain: Secondary | ICD-10-CM | POA: Diagnosis not present

## 2020-01-21 DIAGNOSIS — M9903 Segmental and somatic dysfunction of lumbar region: Secondary | ICD-10-CM | POA: Diagnosis not present

## 2020-01-21 DIAGNOSIS — M9902 Segmental and somatic dysfunction of thoracic region: Secondary | ICD-10-CM | POA: Diagnosis not present

## 2020-03-06 DIAGNOSIS — I10 Essential (primary) hypertension: Secondary | ICD-10-CM | POA: Diagnosis not present

## 2020-03-06 DIAGNOSIS — F331 Major depressive disorder, recurrent, moderate: Secondary | ICD-10-CM | POA: Diagnosis not present

## 2020-03-06 DIAGNOSIS — F419 Anxiety disorder, unspecified: Secondary | ICD-10-CM | POA: Diagnosis not present

## 2020-03-30 DIAGNOSIS — I10 Essential (primary) hypertension: Secondary | ICD-10-CM | POA: Diagnosis not present

## 2020-03-30 DIAGNOSIS — R55 Syncope and collapse: Secondary | ICD-10-CM | POA: Diagnosis not present

## 2020-03-30 DIAGNOSIS — F331 Major depressive disorder, recurrent, moderate: Secondary | ICD-10-CM | POA: Diagnosis not present

## 2020-03-31 IMAGING — MG DIGITAL SCREENING BILATERAL MAMMOGRAM WITH TOMO AND CAD
6 of 12 series · 6 of 36 positions shown · non-contrast
Comparison: None.

CLINICAL DATA: Screening.

EXAM:
DIGITAL SCREENING BILATERAL MAMMOGRAM WITH TOMO AND CAD

[L MLO synth-2D]
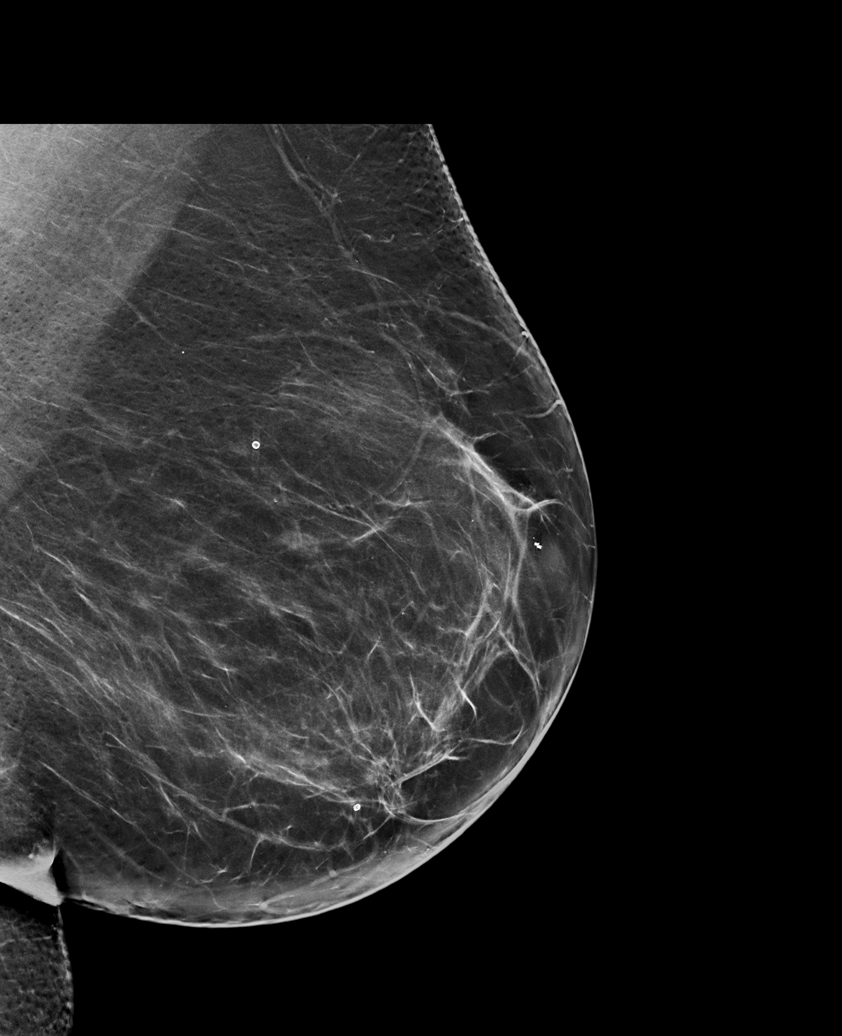

[R MLO synth-2D (1 of 2)]
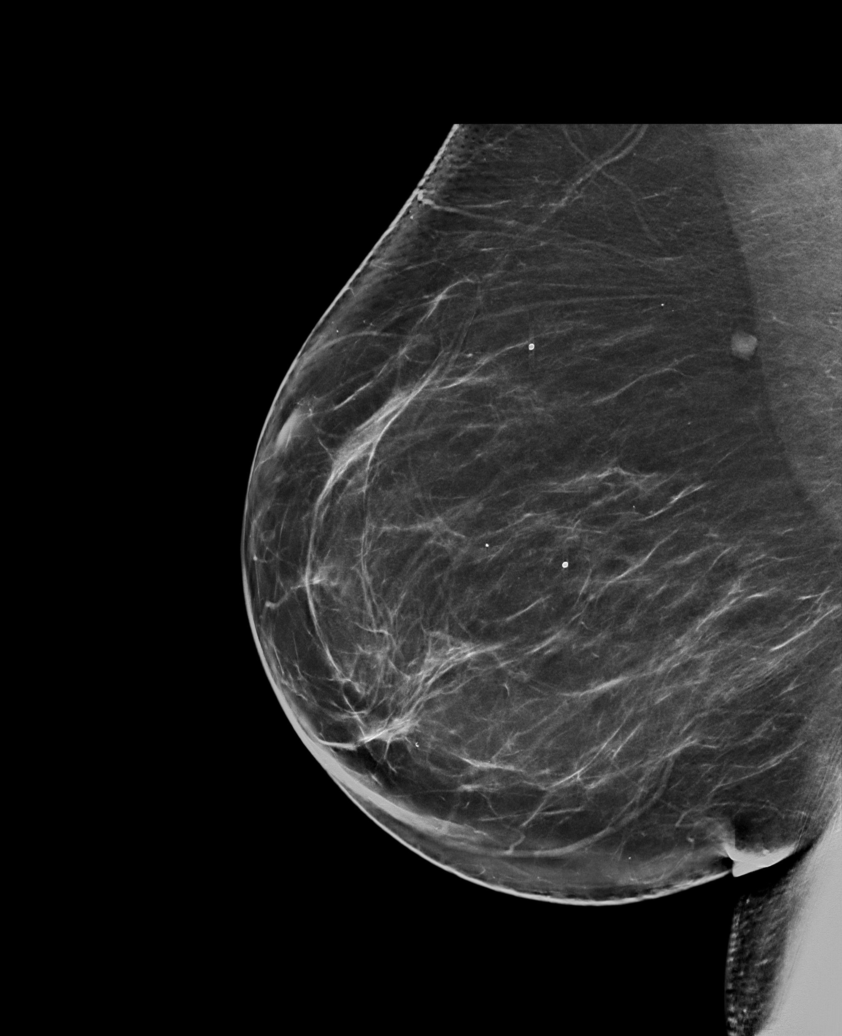

[L CC synth-2D (1 of 2)]
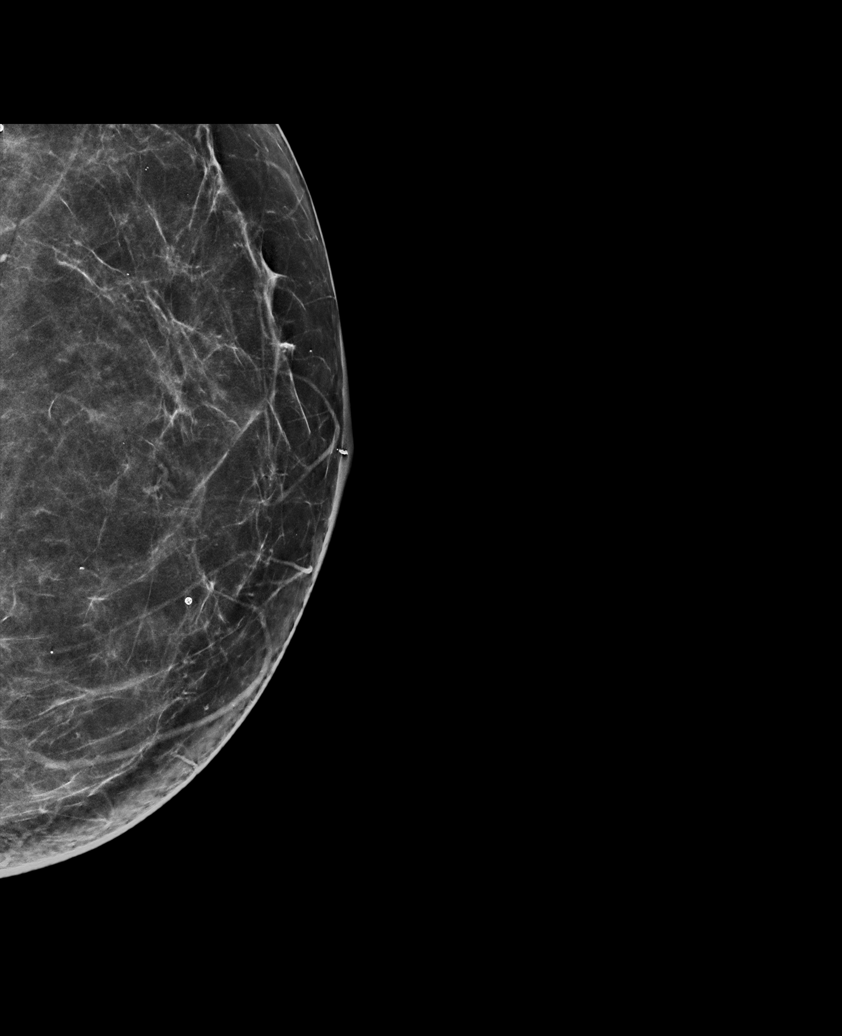

[R CC synth-2D]
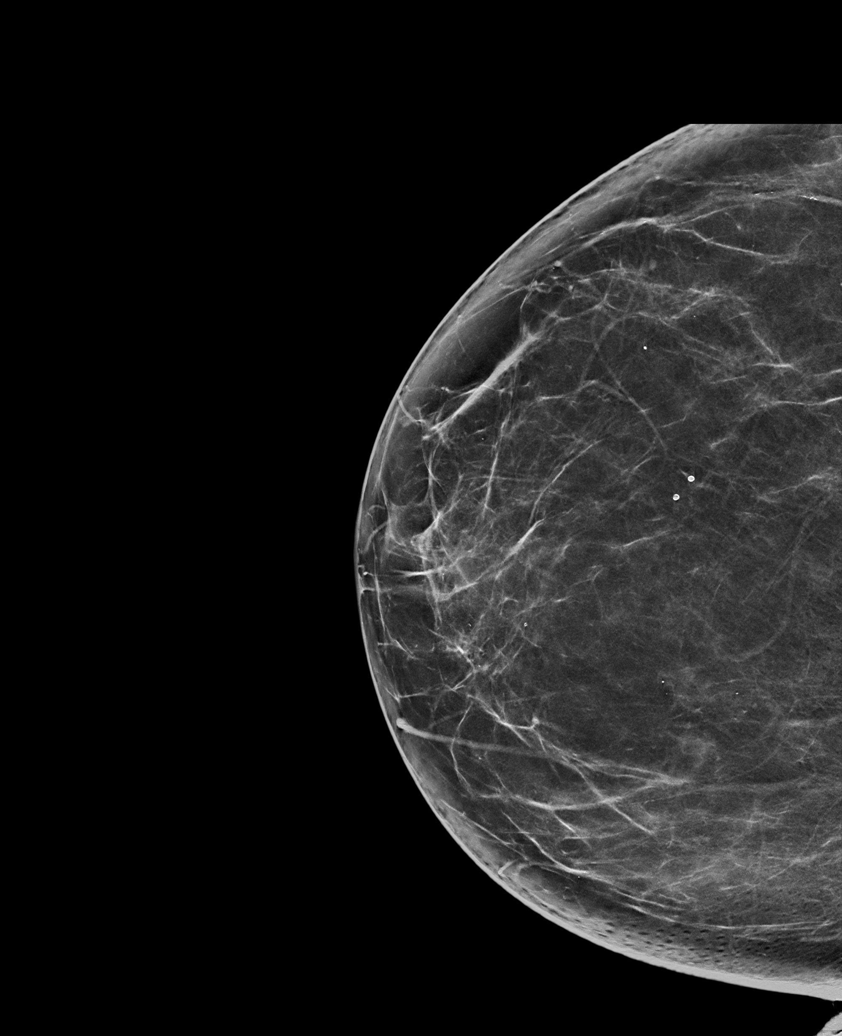

[R MLO synth-2D (2 of 2)]
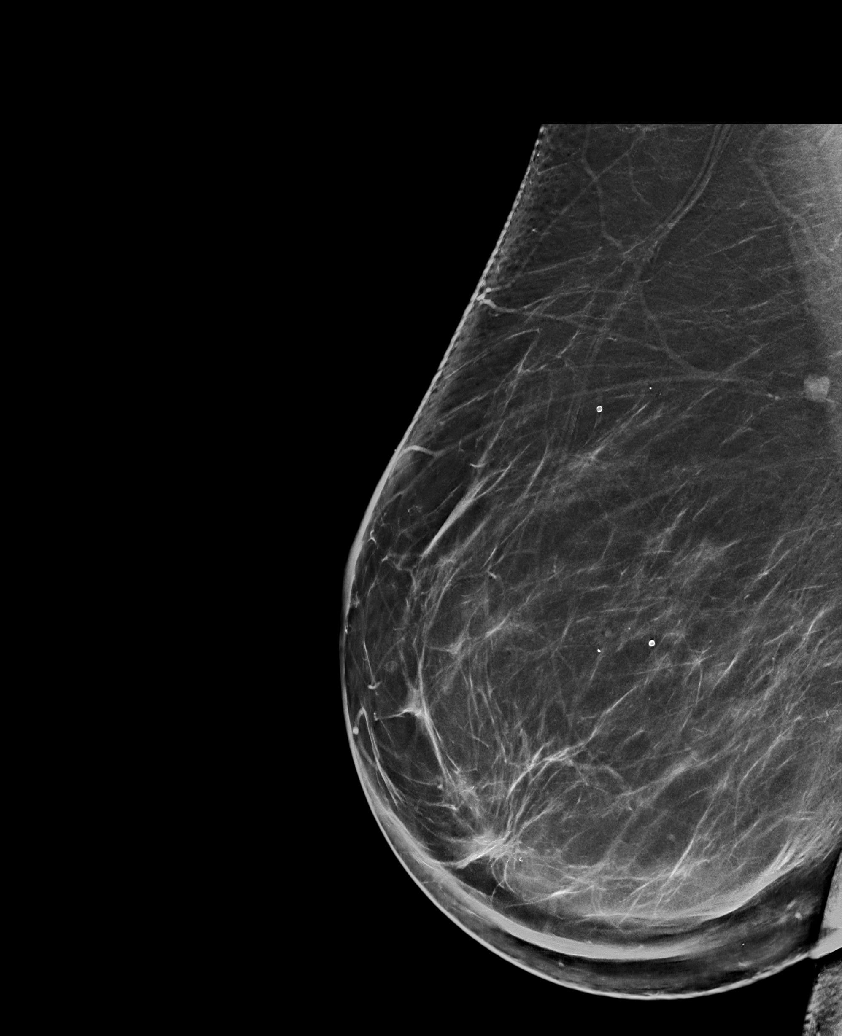

[L CC synth-2D (2 of 2)]
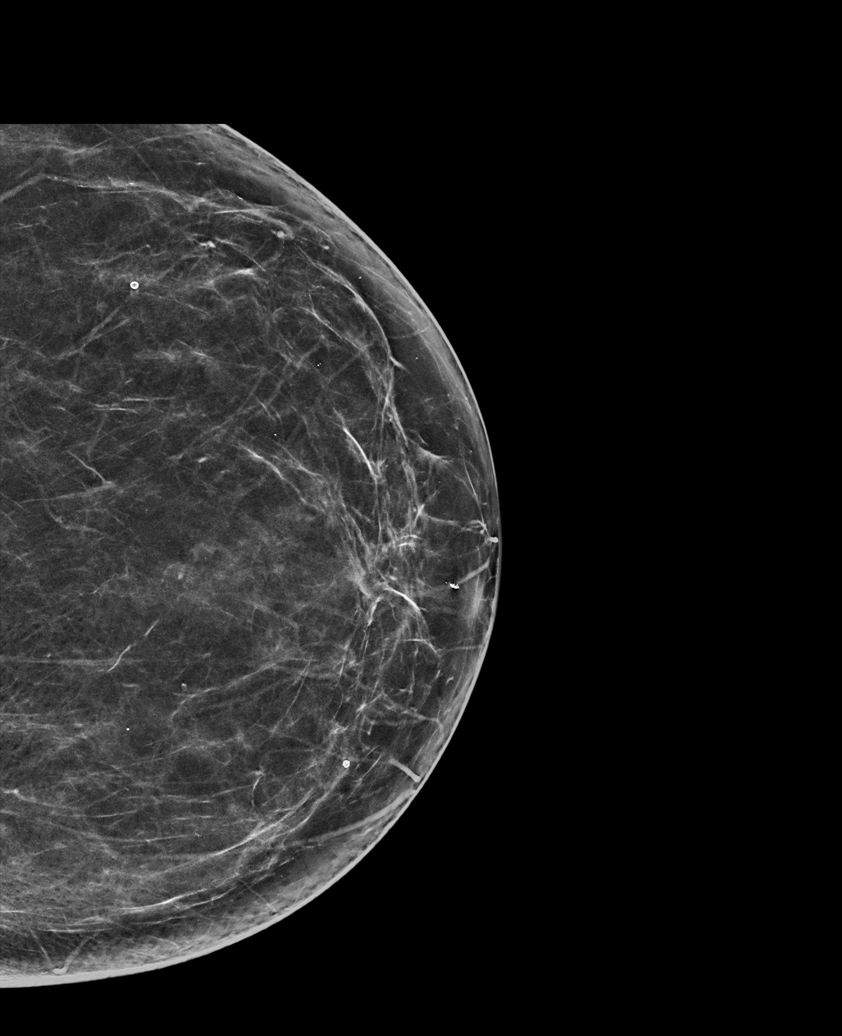

[6 of 36 positions shown; findings below may reference images not displayed]

ACR Breast Density Category b: There are scattered areas of
fibroglandular density.
FINDINGS: There are no findings suspicious for malignancy. Images were
processed with CAD.
IMPRESSION: No mammographic evidence of malignancy. A result letter of this
screening mammogram will be mailed directly to the patient.

RECOMMENDATION:
Screening mammogram in one year. (Code:Y5-G-EJ6)

BI-RADS CATEGORY  1: Negative.

## 2020-04-20 DIAGNOSIS — M533 Sacrococcygeal disorders, not elsewhere classified: Secondary | ICD-10-CM | POA: Insufficient documentation

## 2020-04-20 DIAGNOSIS — R55 Syncope and collapse: Secondary | ICD-10-CM | POA: Insufficient documentation

## 2020-04-20 DIAGNOSIS — R569 Unspecified convulsions: Secondary | ICD-10-CM | POA: Insufficient documentation

## 2020-04-20 DIAGNOSIS — R002 Palpitations: Secondary | ICD-10-CM | POA: Diagnosis not present

## 2020-04-20 DIAGNOSIS — F419 Anxiety disorder, unspecified: Secondary | ICD-10-CM | POA: Diagnosis not present

## 2020-04-20 DIAGNOSIS — E559 Vitamin D deficiency, unspecified: Secondary | ICD-10-CM | POA: Diagnosis not present

## 2020-04-20 DIAGNOSIS — I10 Essential (primary) hypertension: Secondary | ICD-10-CM | POA: Diagnosis not present

## 2020-04-20 DIAGNOSIS — Z79899 Other long term (current) drug therapy: Secondary | ICD-10-CM | POA: Diagnosis not present

## 2020-04-25 DIAGNOSIS — Z79899 Other long term (current) drug therapy: Secondary | ICD-10-CM | POA: Diagnosis not present

## 2020-04-25 DIAGNOSIS — E559 Vitamin D deficiency, unspecified: Secondary | ICD-10-CM | POA: Diagnosis not present

## 2020-05-31 DIAGNOSIS — E559 Vitamin D deficiency, unspecified: Secondary | ICD-10-CM | POA: Insufficient documentation

## 2020-06-01 DIAGNOSIS — G4700 Insomnia, unspecified: Secondary | ICD-10-CM | POA: Insufficient documentation

## 2020-06-01 DIAGNOSIS — G4733 Obstructive sleep apnea (adult) (pediatric): Secondary | ICD-10-CM | POA: Insufficient documentation

## 2020-06-01 DIAGNOSIS — R569 Unspecified convulsions: Secondary | ICD-10-CM | POA: Diagnosis not present

## 2020-06-01 DIAGNOSIS — I10 Essential (primary) hypertension: Secondary | ICD-10-CM | POA: Diagnosis not present

## 2020-06-01 DIAGNOSIS — M533 Sacrococcygeal disorders, not elsewhere classified: Secondary | ICD-10-CM | POA: Diagnosis not present

## 2020-06-01 DIAGNOSIS — R002 Palpitations: Secondary | ICD-10-CM | POA: Diagnosis not present

## 2020-06-01 DIAGNOSIS — R55 Syncope and collapse: Secondary | ICD-10-CM | POA: Diagnosis not present

## 2020-06-01 DIAGNOSIS — F419 Anxiety disorder, unspecified: Secondary | ICD-10-CM | POA: Diagnosis not present

## 2020-06-01 DIAGNOSIS — E559 Vitamin D deficiency, unspecified: Secondary | ICD-10-CM | POA: Diagnosis not present

## 2020-06-20 DIAGNOSIS — I10 Essential (primary) hypertension: Secondary | ICD-10-CM | POA: Diagnosis not present

## 2020-06-27 ENCOUNTER — Other Ambulatory Visit: Payer: Self-pay

## 2020-06-27 ENCOUNTER — Ambulatory Visit (HOSPITAL_COMMUNITY)
Admission: RE | Admit: 2020-06-27 | Discharge: 2020-06-27 | Disposition: A | Discharge status: Home / Self Care | Payer: Medicare HMO | Source: Ambulatory Visit | Attending: Neurology | Admitting: Neurology

## 2020-06-27 DIAGNOSIS — R400 Somnolence: Secondary | ICD-10-CM | POA: Insufficient documentation

## 2020-06-27 DIAGNOSIS — Z79899 Other long term (current) drug therapy: Secondary | ICD-10-CM | POA: Diagnosis not present

## 2020-06-27 DIAGNOSIS — R569 Unspecified convulsions: Secondary | ICD-10-CM | POA: Diagnosis not present

## 2020-06-27 NOTE — Progress Notes (Signed)
EEG complete - results pending 

## 2020-06-29 NOTE — Procedures (Addendum)
  Slater A. Merlene Laughter, MD     [email or web address]           HISTORY: This is a 69 year old female who presents with spells that are thought to be epileptic seizures.  MEDICATIONS:  Current Outpatient Medications:  .  cetirizine (ZYRTEC) 10 MG tablet, Take 10 mg by mouth daily., Disp: , Rfl:  .  ibuprofen (ADVIL) 200 MG tablet, Take 200 mg by mouth as needed for headache, mild pain or moderate pain. , Disp: , Rfl:  .  Melatonin 10 MG TABS, Take 10 mg by mouth at bedtime as needed. , Disp: , Rfl:      ANALYSIS: A 16 channel recording using standard 10 20 measurements is conducted for 25 minutes.  There is a well-formed posterior dominant rhythm of 12 Hz which attenuates with eye opening.  There is beta activity observed in the frontal areas.  The recording transitions to theta slowing indicating drowsiness.  There is infrequent sharp wave activity that phase reverses at F7.  Photic stimulation is carried out without abnormal changes in the background activity.  Focal slowing or lateral slowing is not observed.   IMPRESSION: 1.  This recording of the awake and drowsy state shows infrequent sharp wave activity involving the left anterior temporal region.  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      Trecia Maring A. Merlene Laughter, M.D.  Diplomate, Tax adviser of Psychiatry and Neurology ( Neurology).

## 2020-06-30 ENCOUNTER — Other Ambulatory Visit (HOSPITAL_COMMUNITY): Payer: Self-pay | Admitting: Internal Medicine

## 2020-06-30 ENCOUNTER — Other Ambulatory Visit: Payer: Self-pay

## 2020-06-30 ENCOUNTER — Ambulatory Visit (HOSPITAL_COMMUNITY)
Admission: RE | Admit: 2020-06-30 | Discharge: 2020-06-30 | Disposition: A | Discharge status: Home / Self Care | Payer: Medicare HMO | Source: Ambulatory Visit | Attending: Internal Medicine | Admitting: Internal Medicine

## 2020-06-30 DIAGNOSIS — M533 Sacrococcygeal disorders, not elsewhere classified: Secondary | ICD-10-CM | POA: Diagnosis not present

## 2020-06-30 DIAGNOSIS — M160 Bilateral primary osteoarthritis of hip: Secondary | ICD-10-CM | POA: Diagnosis not present

## 2020-06-30 DIAGNOSIS — R52 Pain, unspecified: Secondary | ICD-10-CM | POA: Insufficient documentation

## 2020-06-30 DIAGNOSIS — M47816 Spondylosis without myelopathy or radiculopathy, lumbar region: Secondary | ICD-10-CM | POA: Diagnosis not present

## 2020-07-27 DIAGNOSIS — R269 Unspecified abnormalities of gait and mobility: Secondary | ICD-10-CM | POA: Insufficient documentation

## 2020-07-27 DIAGNOSIS — R42 Dizziness and giddiness: Secondary | ICD-10-CM | POA: Insufficient documentation

## 2020-07-27 DIAGNOSIS — R29818 Other symptoms and signs involving the nervous system: Secondary | ICD-10-CM | POA: Insufficient documentation

## 2020-07-27 DIAGNOSIS — G4730 Sleep apnea, unspecified: Secondary | ICD-10-CM | POA: Insufficient documentation

## 2020-08-01 ENCOUNTER — Encounter: Payer: Self-pay | Admitting: Adult Health

## 2020-08-01 ENCOUNTER — Ambulatory Visit (INDEPENDENT_AMBULATORY_CARE_PROVIDER_SITE_OTHER): Payer: Medicare Other | Admitting: Adult Health

## 2020-08-01 ENCOUNTER — Other Ambulatory Visit (HOSPITAL_COMMUNITY)
Admission: RE | Admit: 2020-08-01 | Discharge: 2020-08-01 | Disposition: A | Discharge status: Home / Self Care | Payer: Medicare Other | Source: Ambulatory Visit | Attending: Adult Health | Admitting: Adult Health

## 2020-08-01 VITALS — BP 136/68 | HR 80 | Ht 67.0 in | Wt 267.5 lb

## 2020-08-01 DIAGNOSIS — Z1151 Encounter for screening for human papillomavirus (HPV): Secondary | ICD-10-CM | POA: Insufficient documentation

## 2020-08-01 DIAGNOSIS — Z1211 Encounter for screening for malignant neoplasm of colon: Secondary | ICD-10-CM | POA: Diagnosis not present

## 2020-08-01 DIAGNOSIS — Z0000 Encounter for general adult medical examination without abnormal findings: Secondary | ICD-10-CM | POA: Diagnosis not present

## 2020-08-01 DIAGNOSIS — Z01419 Encounter for gynecological examination (general) (routine) without abnormal findings: Secondary | ICD-10-CM | POA: Insufficient documentation

## 2020-08-01 DIAGNOSIS — R87810 Cervical high risk human papillomavirus (HPV) DNA test positive: Secondary | ICD-10-CM | POA: Diagnosis not present

## 2020-08-01 LAB — HEMOCCULT GUIAC POC 1CARD (OFFICE): Fecal Occult Blood, POC: NEGATIVE

## 2020-08-01 NOTE — Progress Notes (Signed)
Patient ID: Kathryn Compton, female   DOB: 1951/02/26, 69 y.o.   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 She fainted and has sen Dr Merlene Laughter for follow up, had sleep study too. PCP is Dr Legrand Rams    Current Medications, Allergies, Past Medical History, Past Surgical History, Family History and Social History were reviewed in [place] record.     Review of Systems: Patient denies any headaches, hearing loss, fatigue, blurred vision, shortness of breath, chest pain, abdominal pain, problems with bowel movements, urination, or intercourse. No joint pain or mood swings. Does not sleep well.   Physical Exam: BP 136/68 (BP Location: Left Arm, Patient Position: Sitting, Cuff Size: Large)   Pulse 80   Ht 5\' 7"  (1.702 m)   Wt 267 lb 8 oz (121.3 kg)   BMI 41.90 kg/m  General:  Well developed, well nourished, no acute distress Skin:  Warm and dry Neck:  Midline trachea, normal thyroid, good ROM, no lymphadenopathy,no carotid bruits heard  Lungs; Clear to auscultation bilaterally Breast:  No dominant palpable mass, retraction, or nipple discharge Cardiovascular: Regular rate and rhythm Abdomen:  Soft, non tender, no hepatosplenomegaly Pelvic:  External genitalia is normal in appearance, no lesions.  The vagina is normal in appearance. Urethra has no lesions or masses. The cervix is smooth, pap with high risk HPV genotyping per formed.   Uterus is felt to be normal size, shape, and contour.  No adnexal masses or tenderness noted.Bladder is non tender, no masses felt. Rectal: Good sphincter tone, no polyps, or hemorrhoids felt.  Hemoccult negative. Extremities/musculoskeletal:  No swelling or varicosities noted, no clubbing or cyanosis Psych:  No mood changes, alert and cooperative,seems happy AA is 0 Fall risk is low PHQ 9 score is 13, no SI, declines  meds,says is allergic to them, just deals with it, did mention counseling to her.   [facility] - 08/01/20 1155      Pregnancy Intention Screening   Does the patient want to become pregnant in the next year? N/A    Does the patient's partner want to become pregnant in the next year? N/A    Would the patient like to discuss contraceptive options today? No      Contraception Wrap Up   Current Method Female Sterilization    End Method Female Sterilization    Contraception Counseling Provided No          Examination chaperoned by Levy Pupa LPN  Impression and Plan" Physical in 1 year Pap sent Pap in 3 if normal Get mammogram Colonoscopy per [facility] with PCP

## 2020-08-03 LAB — CYTOLOGY - PAP
Comment: NEGATIVE
Comment: NEGATIVE
Diagnosis: NEGATIVE
HPV 16: NEGATIVE
HPV 18 / 45: NEGATIVE
High risk HPV: POSITIVE — AB

## 2020-08-08 ENCOUNTER — Telehealth: Payer: Self-pay | Admitting: Adult Health

## 2020-08-08 NOTE — Telephone Encounter (Signed)
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  

## 2020-08-08 NOTE — Telephone Encounter (Signed)
Eldana aware that she has colpo appt 08/29/20 at 10;30 am with Dr Rosana Hoes, at the [facility] [place], number is [phone]

## 2020-08-29 ENCOUNTER — Ambulatory Visit: Payer: Medicare Other | Admitting: Obstetrics and Gynecology

## 2020-09-07 ENCOUNTER — Other Ambulatory Visit: Payer: Self-pay | Admitting: Neurology

## 2020-09-07 ENCOUNTER — Other Ambulatory Visit (HOSPITAL_COMMUNITY): Payer: Self-pay | Admitting: Neurology

## 2020-09-07 DIAGNOSIS — R569 Unspecified convulsions: Secondary | ICD-10-CM

## 2020-09-19 ENCOUNTER — Ambulatory Visit (HOSPITAL_COMMUNITY): Payer: Medicare Other

## 2020-10-05 ENCOUNTER — Ambulatory Visit (HOSPITAL_COMMUNITY)
Admission: RE | Admit: 2020-10-05 | Discharge: 2020-10-05 | Disposition: A | Discharge status: Home / Self Care | Payer: 59 | Source: Ambulatory Visit | Attending: Neurology | Admitting: Neurology

## 2020-10-05 ENCOUNTER — Other Ambulatory Visit: Payer: Self-pay

## 2020-10-05 DIAGNOSIS — R569 Unspecified convulsions: Secondary | ICD-10-CM | POA: Insufficient documentation

## 2020-10-05 MED ORDER — GADOBUTROL 1 MMOL/ML IV SOLN
10.0000 mL | Freq: Once | INTRAVENOUS | Status: AC | PRN
  Administered 2020-10-05: 10 mL via INTRAVENOUS

## 2020-10-20 ENCOUNTER — Ambulatory Visit (HOSPITAL_COMMUNITY): Payer: 59 | Attending: Neurology | Admitting: Physical Therapy

## 2020-10-20 ENCOUNTER — Other Ambulatory Visit: Payer: Self-pay

## 2020-10-20 ENCOUNTER — Encounter (HOSPITAL_COMMUNITY): Payer: Self-pay | Admitting: Physical Therapy

## 2020-10-20 DIAGNOSIS — G8929 Other chronic pain: Secondary | ICD-10-CM | POA: Diagnosis present

## 2020-10-20 DIAGNOSIS — M545 Low back pain, unspecified: Secondary | ICD-10-CM | POA: Insufficient documentation

## 2020-10-20 DIAGNOSIS — M533 Sacrococcygeal disorders, not elsewhere classified: Secondary | ICD-10-CM | POA: Diagnosis not present

## 2020-10-20 NOTE — Therapy (Signed)
[place], [place], [address] Phone: [phone]   Fax:  [phone]  Physical Therapy Evaluation  Patient Details  Name: Kathryn Compton MRN: [id number] Date of Birth: 03-14-1951 Referring Provider (PT): Barton Fanny NP   Encounter Date: 10/20/2020   PT End of Session - 10/20/20 1405    Visit Number 1    Number of Visits 12    Date for PT Re-Evaluation 12/01/20    Authorization Type UHC Medicare no VL, no auth    Progress Note Due on Visit 10    PT Start Time 1405    PT Stop Time 1445    PT Time Calculation (min) 40 min    Activity Tolerance Patient tolerated treatment well    Behavior During Therapy [facility] for tasks assessed/performed           Past Medical History:  Diagnosis Date  . Anxiety   . Depression   . Hypertension   . Motion sickness   . Plantar fasciitis   . Seizures ([place])   . Vertigo     Past Surgical History:  Procedure Laterality Date  . BREAST REDUCTION SURGERY    . COLONOSCOPY N/A 09/06/2019   Procedure: COLONOSCOPY;  Surgeon: Danie Binder, MD;  [facility];  Service: Endoscopy;  Laterality: N/A;  2:00  . POLYPECTOMY  09/06/2019   Procedure: POLYPECTOMY;  Surgeon: Danie Binder, MD;  [facility];  Service: Endoscopy;;  . REDUCTION MAMMAPLASTY    . TUBAL LIGATION      There were no vitals filed for this visit.    Subjective Assessment - 10/20/20 1413    Subjective States her right SI has hurt for years. States that it flares up. States that her low back pain is everyday. States as soon as she stands up to wash dishes her pain flares up in her back. States her whole back is her primary complaint. States she has been stretching and it has helped. States she performs seated lumbar flexion and this helps the SIJ. States that in her the morning her low back doesn't hurt. States her current pain is 2/10 states it can get up to 10/10. States she sits down  and it eases off. States she doesn't put anything on it. States she occasionally takes ibuprofen. States she currently has seizures but they are not they type where she loses consciousness. States she can tell when she is having a seizure. Reports she is currently on medication for it. States all cleaning is challenging, she could vacuum but it is challenging. States she has put a lot of weight on as she no longer has any energy because of her seizures. States she needs the medication to work so she can hopefully have improved energy. States most recent medication for seizures she has been on for 5 weeks.    Pertinent History seizures    How long can you stand comfortably? 15-20 minutes at counter, 3 minutes at sink    How long can you walk comfortably? no limitations    Patient Stated Goals to be able to clean and do functional tasks better    Currently in Pain? Yes    Pain Score 2     Pain Location Back    Pain Orientation Lower    Pain Descriptors / Indicators Constant    Pain Type Chronic pain    Pain Frequency Constant    Aggravating Factors  standing, walking  Pain Relieving Factors sitting              [facility] PT Assessment - 10/20/20 0001      Assessment   Medical Diagnosis SIJ    Referring Provider (PT) Barton Fanny NP    Prior Therapy no      Balance Screen   Has the patient fallen in the past 6 months Yes    How many times? 1   due to seizure    Has the patient had a decrease in activity level because of a fear of falling?  Yes    Is the patient reluctant to leave their home because of a fear of falling?  No      Home Ecologist residence    Living Arrangements Spouse/significant other    Available Help at Discharge Family    Type of [place] to enter    Entrance Stairs-Number of Steps 1    [place] One level    [place] None      Prior Function   Level of Independence Independent    Vocation  Retired;Self employed   Probation officer   Leisure walking      Cognition   Overall Cognitive Status Within Functional Limits for tasks assessed      Observation/Other Assessments   Focus on Therapeutic Outcomes (FOTO)  NA      ROM / Strength   AROM / PROM / Strength AROM;Strength      AROM   Overall AROM Comments pain laying prone, able to lay prone with 2 pillows for 1:30    AROM Assessment Site Lumbar    Lumbar Flexion 25% limited   pull on back of legs but back felt good   Lumbar Extension 75% limited   twinge in low back, hitches at mid lumbar spine    Lumbar - Right Side Bend 50% limited    no change in symptoms    Lumbar - Left Side Bend 75% limited   no change in symptoms   Lumbar - Right Rotation 50% limited   no symptom change   Lumbar - Left Rotation 25% limited    no change in symptoms.      Strength   Strength Assessment Site Hip;Knee;Ankle    Right/Left Hip Right;Left    Right Hip Flexion 4/5    Left Hip Flexion 4/5    Right/Left Knee Right;Left    Right Knee Flexion 4+/5    Right Knee Extension 4+/5    Left Knee Flexion 4+/5    Left Knee Extension 4+/5      Ambulation/Gait   Ambulation/Gait Yes    Ambulation/Gait Assistance 7: Independent    Ambulation Distance (Feet) 532 Feet    Assistive device None    Ambulation Surface Level;Indoor    Gait velocity fast    Gait Comments 2MW                       Objective measurements completed on examination: See above findings.       [facility] Adult PT Treatment/Exercise - 10/20/20 0001      Exercises   Exercises Knee/Hip      Knee/Hip Exercises: Stretches   Other Knee/Hip Stretches DKC x5 10" holds                  PT Education - 10/20/20 1500    Education Details in anatomy, findings,  focus, POC and rationale for exercises.    Person(s) Educated Patient    Methods Explanation    Comprehension Verbalized understanding            PT Short Term Goals - 10/20/20 1406      PT SHORT TERM  GOAL #1   Title Patient will be able to stand at sink for at least 5 minutes without severe pain to improve standing tolerance while washing dishes    Time 3    Period Weeks    Status New    Target Date 11/10/20      PT SHORT TERM GOAL #2   Title Patient will be independent in self management strategies to improve quality of life and functional outcomes.    Time 3    Period Weeks    Status New    Target Date 11/10/20      PT SHORT TERM GOAL #3   Title Patient will report at least 25% improvement in overall symptoms and/or function to demonstrate improved functional mobility    Time 3    Period Weeks    Status New    Target Date 11/10/20             PT Long Term Goals - 10/20/20 1406      PT LONG TERM GOAL #1   Title Patient will be able to stand at sink for at least 10 minutes without severe pain to improve standing tolerance while washing dishes    Time 6    Period Weeks    Status New    Target Date 12/01/20      PT LONG TERM GOAL #2   Title Patient will report at least 50% improvement in overall symptoms and/or function to demonstrate improved functional mobility    Time 6    Period Weeks    Status New    Target Date 12/01/20      PT LONG TERM GOAL #3   Title Patient will report compliance with walking program at least 5 days a week    Time 6    Period Weeks    Status New    Target Date 12/01/20      PT LONG TERM GOAL #4   Title Patient will be able to lay prone with one pillow under stomach for at least 3 minutes to demonstrate improved psoas length    Time 6    Period Weeks    Status New    Target Date 12/01/20                  Plan - 10/20/20 1455    Clinical Impression Statement Patient presents to therapy with 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 Patient presents with increased lumbar lordosis and decreased hip flexor length and overall deconditioning. Educated patient in current findings and  plan for exercises and PT moving forward. Patient would greatly benefit from skilled physical therapy to improve tolerance to required tasks around the house and improve overall quality of life.    Personal Factors and Comorbidities Comorbidity 1;Comorbidity 2    Comorbidities seizures, SIJ issue    Examination-Activity Limitations Stand;Squat;Lift    Examination-Participation Restrictions Meal Prep;Cleaning;Laundry    Stability/Clinical Decision Making Stable/Uncomplicated    Clinical Decision Making Low    Rehab Potential Good    PT Frequency 2x / week    PT Duration 6 weeks    PT Treatment/Interventions ADLs/Self Care  Home Management;Cryotherapy;Electrical Stimulation;Moist Heat;Traction;Therapeutic exercise;Therapeutic activities;Functional mobility training;Gait training;Neuromuscular re-education;Patient/family education;Manual techniques    PT Next Visit Plan gentle psoas lengthening, lumbar flexion when pain presents, hip strengthening (posterior chain),lumbar mobility    PT Home Exercise Plan walking program, DKC, prone lying over pillows    Consulted and Agree with Plan of Care Patient           Patient will benefit from skilled therapeutic intervention in order to improve the following deficits and impairments:  Pain, Decreased activity tolerance, Decreased strength, Decreased mobility, Decreased range of motion, Postural dysfunction  Visit Diagnosis: Sacroiliac joint pain  Chronic midline low back pain without sciatica     Problem List Patient Active Problem List   Diagnosis Date Noted  . Papanicolaou smear of cervix with positive high risk human papilloma virus (HPV) test 08/01/2020  . Encounter for screening fecal occult blood testing 08/01/2020  . Routine medical exam 08/01/2020  . Encounter for gynecological examination with Papanicolaou smear of cervix 08/01/2020  . Special screening for malignant neoplasms, colon    3:06 PM, 10/20/20 Jerene Pitch,  DPT Physical Therapy with [facility]  [phone] office  [place] [address] [place], [place], [address] Phone: [phone]   Fax:  [phone]  Name: Bionca Mckey MRN: [id number] Date of Birth: 1951-01-30

## 2020-10-24 ENCOUNTER — Ambulatory Visit: Payer: Medicare Other | Admitting: Obstetrics and Gynecology

## 2020-11-01 ENCOUNTER — Telehealth (HOSPITAL_COMMUNITY): Payer: Self-pay

## 2020-11-01 ENCOUNTER — Ambulatory Visit (HOSPITAL_COMMUNITY): Payer: 59

## 2020-11-01 NOTE — Telephone Encounter (Signed)
pt called to cancel today's appt message left on vm no reason given.

## 2020-11-02 ENCOUNTER — Ambulatory Visit (HOSPITAL_COMMUNITY): Payer: 59 | Attending: Neurology

## 2020-11-02 ENCOUNTER — Other Ambulatory Visit: Payer: Self-pay

## 2020-11-02 ENCOUNTER — Encounter (HOSPITAL_COMMUNITY): Payer: Self-pay

## 2020-11-02 DIAGNOSIS — M533 Sacrococcygeal disorders, not elsewhere classified: Secondary | ICD-10-CM

## 2020-11-02 DIAGNOSIS — G8929 Other chronic pain: Secondary | ICD-10-CM | POA: Diagnosis present

## 2020-11-02 DIAGNOSIS — M545 Low back pain, unspecified: Secondary | ICD-10-CM | POA: Diagnosis present

## 2020-11-02 NOTE — Patient Instructions (Signed)
Take a small pillow or towel and roll up, place in lower back for support while sitting for long periods of time. Can complete abdominal sets in this position.

## 2020-11-02 NOTE — Therapy (Signed)
[place], [place], [address] Phone: [phone]   Fax:  [phone]  Physical Therapy Treatment  Patient Details  Name: Kathryn Compton MRN: [id number] Date of Birth: Aug 10, 1951 Referring Provider (PT): Barton Fanny NP   Encounter Date: 11/02/2020   PT End of Session - 11/02/20 1128    Visit Number 2    Number of Visits 12    Date for PT Re-Evaluation 12/01/20    Authorization Type UHC Medicare no VL, no auth    Progress Note Due on Visit 10    PT Start Time 1123    PT Stop Time 1205    PT Time Calculation (min) 42 min    Activity Tolerance Patient tolerated treatment well    Behavior During Therapy [facility] for tasks assessed/performed           Past Medical History:  Diagnosis Date  . Anxiety   . Depression   . Hypertension   . Motion sickness   . Plantar fasciitis   . Seizures ([place])   . Vertigo     Past Surgical History:  Procedure Laterality Date  . BREAST REDUCTION SURGERY    . COLONOSCOPY N/A 09/06/2019   Procedure: COLONOSCOPY;  Surgeon: Danie Binder, MD;  [facility];  Service: Endoscopy;  Laterality: N/A;  2:00  . POLYPECTOMY  09/06/2019   Procedure: POLYPECTOMY;  Surgeon: Danie Binder, MD;  [facility];  Service: Endoscopy;;  . REDUCTION MAMMAPLASTY    . TUBAL LIGATION      There were no vitals filed for this visit.   Subjective Assessment - 11/02/20 1125    Subjective Pt reoprts pain in Rt SI and middle lower back, reports increased pain following the exercises.  Reports compliance with HEP exercises 2x daily.  Current pain scale 2-3/10 sharp pain wtih difficulty standing up,    Pertinent History seizures    Patient Stated Goals to be able to clean and do functional tasks better    Currently in Pain? Yes    Pain Score 3     Pain Location Back    Pain Orientation Lower    Pain Descriptors / Indicators Sharp    Pain Type Chronic pain    Pain Onset More  than a month ago    Pain Frequency Constant    Aggravating Factors  standing, walking.    Pain Relieving Factors sitting, forward flexion based exercises                             [facility] Adult PT Treatment/Exercise - 11/02/20 0001      Exercises   Exercises Lumbar      Lumbar Exercises: Stretches   Lower Trunk Rotation 5 reps;10 seconds    Lower Trunk Rotation Limitations feet 90/90 on green ball    Hip Flexor Stretch 3 reps;30 seconds    Hip Flexor Stretch Limitations Thomas stretch- supine edge of bed      Lumbar Exercises: Seated   Other Seated Lumbar Exercises posterior pelvic tilt    Other Seated Lumbar Exercises Educated benefits of lumbar support for prolonged sitting      Lumbar Exercises: Supine   Pelvic Tilt 10 reps;5 seconds    Pelvic Tilt Limitations posterior pelvic tilt    Heel Slides 10 reps    Heel Slides Limitations feet on ball with ab set    Bridge 10 reps;3  seconds    Bridge Limitations with ab set                  PT Education - 11/02/20 1139    Education Details Reviewed goals, educated importance of HEP compliance, pt able to verbalize exercises and reports improved tolerance with 3 pillows under hip in prone exercise.  Educated anatomy and importance of posture.  Educated on benefits of prolonged stretch for musclature lengthening.    Person(s) Educated Patient    Methods Explanation    Comprehension Verbalized understanding;Returned demonstration            PT Short Term Goals - 10/20/20 1406      PT SHORT TERM GOAL #1   Title Patient will be able to stand at sink for at least 5 minutes without severe pain to improve standing tolerance while washing dishes    Time 3    Period Weeks    Status New    Target Date 11/10/20      PT SHORT TERM GOAL #2   Title Patient will be independent in self management strategies to improve quality of life and functional outcomes.    Time 3    Period Weeks    Status New    Target  Date 11/10/20      PT SHORT TERM GOAL #3   Title Patient will report at least 25% improvement in overall symptoms and/or function to demonstrate improved functional mobility    Time 3    Period Weeks    Status New    Target Date 11/10/20             PT Long Term Goals - 10/20/20 1406      PT LONG TERM GOAL #1   Title Patient will be able to stand at sink for at least 10 minutes without severe pain to improve standing tolerance while washing dishes    Time 6    Period Weeks    Status New    Target Date 12/01/20      PT LONG TERM GOAL #2   Title Patient will report at least 50% improvement in overall symptoms and/or function to demonstrate improved functional mobility    Time 6    Period Weeks    Status New    Target Date 12/01/20      PT LONG TERM GOAL #3   Title Patient will report compliance with walking program at least 5 days a week    Time 6    Period Weeks    Status New    Target Date 12/01/20      PT LONG TERM GOAL #4   Title Patient will be able to lay prone with one pillow under stomach for at least 3 minutes to demonstrate improved psoas length    Time 6    Period Weeks    Status New    Target Date 12/01/20                 Plan - 11/02/20 1231    Clinical Impression Statement Reviewed goals, educated importance of HEP compliance for maximal benefits, pt able to recall and verbalize current exercises.  Pt educated anatomy.  Session focus on lumbar and psoas mobility and abdominal strengthening to reduce lumbar lordacic curvature.  Pt stated she plans on riding with her husband to [place] this weekend, educated on lumbar rolls for pain control while riding for long periods of time.  EOS pt reports she feels  good, no reports of pain and erect posture.  Pt liked the theraband exercises, given additional HEP printout for mobility and strenghtenig.    Personal Factors and Comorbidities Comorbidity 1;Comorbidity 2    Comorbidities seizures, SIJ issue     Examination-Activity Limitations Stand;Squat;Lift    Examination-Participation Restrictions Meal Prep;Cleaning;Laundry    Stability/Clinical Decision Making Stable/Uncomplicated    Clinical Decision Making Low    Rehab Potential Good    PT Frequency 2x / week    PT Duration 6 weeks    PT Treatment/Interventions ADLs/Self Care Home Management;Cryotherapy;Electrical Stimulation;Moist Heat;Traction;Therapeutic exercise;Therapeutic activities;Functional mobility training;Gait training;Neuromuscular re-education;Patient/family education;Manual techniques    PT Next Visit Plan gentle psoas lengthening, lumbar flexion when pain presents, hip strengthening (posterior chain),lumbar mobility    PT Home Exercise Plan walking program, DKC, prone lying over pillows; 11/02/20:  TrA sets, thomas stretch, LTR on theraball, lumbar roll instructions           Patient will benefit from skilled therapeutic intervention in order to improve the following deficits and impairments:  Pain, Decreased activity tolerance, Decreased strength, Decreased mobility, Decreased range of motion, Postural dysfunction  Visit Diagnosis: Sacroiliac joint pain  Chronic midline low back pain without sciatica     Problem List Patient Active Problem List   Diagnosis Date Noted  . Papanicolaou smear of cervix with positive high risk human papilloma virus (HPV) test 08/01/2020  . Encounter for screening fecal occult blood testing 08/01/2020  . Routine medical exam 08/01/2020  . Encounter for gynecological examination with Papanicolaou smear of cervix 08/01/2020  . Special screening for malignant neoplasms, colon    Ihor Austin, LPTA/CLT; CBIS [phone]  Aldona Lento 11/02/2020, 1:10 PM  [place] [address] [place], [place], [address] Phone: [phone]   Fax:  [phone]  Name: Jacquette Canales MRN: [id number] Date of Birth: Jul 17, 1951

## 2020-11-08 ENCOUNTER — Ambulatory Visit (HOSPITAL_COMMUNITY): Payer: 59 | Admitting: Physical Therapy

## 2020-11-08 ENCOUNTER — Other Ambulatory Visit: Payer: Self-pay

## 2020-11-08 DIAGNOSIS — G8929 Other chronic pain: Secondary | ICD-10-CM

## 2020-11-08 DIAGNOSIS — M533 Sacrococcygeal disorders, not elsewhere classified: Secondary | ICD-10-CM

## 2020-11-08 NOTE — Therapy (Signed)
[place], [place], [address] Phone: [phone]   Fax:  [phone]  Physical Therapy Treatment  Patient Details  Name: Kathryn Compton MRN: [id number] Date of Birth: 1951-07-03 Referring Provider (PT): Barton Fanny NP   Encounter Date: 11/08/2020   PT End of Session - 11/08/20 1148    Visit Number 3    Number of Visits 12    Date for PT Re-Evaluation 12/01/20    Authorization Type UHC Medicare no VL, no auth    Progress Note Due on Visit 10    PT Start Time 1050    PT Stop Time 1130    PT Time Calculation (min) 40 min    Activity Tolerance Patient tolerated treatment well    Behavior During Therapy [facility] for tasks assessed/performed           Past Medical History:  Diagnosis Date  . Anxiety   . Depression   . Hypertension   . Motion sickness   . Plantar fasciitis   . Seizures ([place])   . Vertigo     Past Surgical History:  Procedure Laterality Date  . BREAST REDUCTION SURGERY    . COLONOSCOPY N/A 09/06/2019   Procedure: COLONOSCOPY;  Surgeon: Danie Binder, MD;  [facility];  Service: Endoscopy;  Laterality: N/A;  2:00  . POLYPECTOMY  09/06/2019   Procedure: POLYPECTOMY;  Surgeon: Danie Binder, MD;  [facility];  Service: Endoscopy;;  . REDUCTION MAMMAPLASTY    . TUBAL LIGATION      There were no vitals filed for this visit.   Subjective Assessment - 11/08/20 1058    Subjective Pt states when she wakes in the morning she's really stiff.  STates the prone exercise increases her pain after she does it.  STates she didnt get the chance to do her exericses over the weekend.    Currently in Pain? Yes                             [facility] Adult PT Treatment/Exercise - 11/08/20 0001      Lumbar Exercises: Stretches   Active Hamstring Stretch Right;Left;3 reps;30 seconds    Active Hamstring Stretch Limitations supine with towel    Lower Trunk Rotation 5  reps;10 seconds    Lower Trunk Rotation Limitations feet 90/90 on green ball    Hip Flexor Stretch 3 reps;30 seconds    Hip Flexor Stretch Limitations standing on step    Piriformis Stretch Right;Left;3 reps;30 seconds    Piriformis Stretch Limitations supine with crossover       Lumbar Exercises: Seated   Other Seated Lumbar Exercises posterior pelvic tilt    Other Seated Lumbar Exercises sitting EOB stab LAQ and marching      Lumbar Exercises: Supine   Pelvic Tilt 10 reps;5 seconds    Pelvic Tilt Limitations posterior pelvic tilt    Heel Slides 10 reps    Heel Slides Limitations feet on ball with ab set    Bridge 10 reps;3 seconds    Bridge Limitations with ab set    Straight Leg Raise 10 reps    Straight Leg Raises Limitations 2 sets                    PT Short Term Goals - 10/20/20 1406      PT SHORT TERM GOAL #1   Title Patient will  be able to stand at sink for at least 5 minutes without severe pain to improve standing tolerance while washing dishes    Time 3    Period Weeks    Status New    Target Date 11/10/20      PT SHORT TERM GOAL #2   Title Patient will be independent in self management strategies to improve quality of life and functional outcomes.    Time 3    Period Weeks    Status New    Target Date 11/10/20      PT SHORT TERM GOAL #3   Title Patient will report at least 25% improvement in overall symptoms and/or function to demonstrate improved functional mobility    Time 3    Period Weeks    Status New    Target Date 11/10/20             PT Long Term Goals - 10/20/20 1406      PT LONG TERM GOAL #1   Title Patient will be able to stand at sink for at least 10 minutes without severe pain to improve standing tolerance while washing dishes    Time 6    Period Weeks    Status New    Target Date 12/01/20      PT LONG TERM GOAL #2   Title Patient will report at least 50% improvement in overall symptoms and/or function to demonstrate  improved functional mobility    Time 6    Period Weeks    Status New    Target Date 12/01/20      PT LONG TERM GOAL #3   Title Patient will report compliance with walking program at least 5 days a week    Time 6    Period Weeks    Status New    Target Date 12/01/20      PT LONG TERM GOAL #4   Title Patient will be able to lay prone with one pillow under stomach for at least 3 minutes to demonstrate improved psoas length    Time 6    Period Weeks    Status New    Target Date 12/01/20                 Plan - 11/08/20 1156    Clinical Impression Statement Continued with established POC; discussed therex that may be increasing symptoms and reported she has discomfort following prone exercise. Instructed to avoid this at this time and just do standing extensions to tolerance.  Progressed core stability exericses with introduction in seated position and with good control.  Added hamstring and piriformis stretch in supine with noted tightness bilaterally and good results achieved.  Instructed to alternative way to complete hip flexor stretch in standing with good stretch obtained as well.    Personal Factors and Comorbidities Comorbidity 1;Comorbidity 2    Comorbidities seizures, SIJ issue    Examination-Activity Limitations Stand;Squat;Lift    Examination-Participation Restrictions Meal Prep;Cleaning;Laundry    Stability/Clinical Decision Making Stable/Uncomplicated    Rehab Potential Good    PT Frequency 2x / week    PT Duration 6 weeks    PT Treatment/Interventions ADLs/Self Care Home Management;Cryotherapy;Electrical Stimulation;Moist Heat;Traction;Therapeutic exercise;Therapeutic activities;Functional mobility training;Gait training;Neuromuscular re-education;Patient/family education;Manual techniques    PT Next Visit Plan gentle psoas lengthening, lumbar flexion when pain presents, hip strengthening (posterior chain),lumbar mobility    PT Home Exercise Plan walking program,  DKC, prone lying over pillows; 11/02/20:  TrA sets, thomas stretch, LTR  on theraball, lumbar roll instructions           Patient will benefit from skilled therapeutic intervention in order to improve the following deficits and impairments:  Pain, Decreased activity tolerance, Decreased strength, Decreased mobility, Decreased range of motion, Postural dysfunction  Visit Diagnosis: Sacroiliac joint pain  Chronic midline low back pain without sciatica     Problem List Patient Active Problem List   Diagnosis Date Noted  . Papanicolaou smear of cervix with positive high risk human papilloma virus (HPV) test 08/01/2020  . Encounter for screening fecal occult blood testing 08/01/2020  . Routine medical exam 08/01/2020  . Encounter for gynecological examination with Papanicolaou smear of cervix 08/01/2020  . Special screening for malignant neoplasms, colon    Teena Irani, PTA/CLT [phone]  Teena Irani 11/08/2020, 11:57 AM  [facility] [address] [place], [place], [address] Phone: [phone]   Fax:  [phone]  Name: Kathryn Compton MRN: [id number] Date of Birth: September 03, 1951

## 2020-11-09 ENCOUNTER — Ambulatory Visit (INDEPENDENT_AMBULATORY_CARE_PROVIDER_SITE_OTHER): Payer: 59 | Admitting: Podiatry

## 2020-11-09 ENCOUNTER — Ambulatory Visit (HOSPITAL_COMMUNITY): Payer: 59 | Admitting: Physical Therapy

## 2020-11-09 ENCOUNTER — Encounter: Payer: Self-pay | Admitting: Podiatry

## 2020-11-09 DIAGNOSIS — M25559 Pain in unspecified hip: Secondary | ICD-10-CM | POA: Insufficient documentation

## 2020-11-09 DIAGNOSIS — M79674 Pain in right toe(s): Secondary | ICD-10-CM

## 2020-11-09 DIAGNOSIS — M79675 Pain in left toe(s): Secondary | ICD-10-CM

## 2020-11-09 DIAGNOSIS — L600 Ingrowing nail: Secondary | ICD-10-CM

## 2020-11-09 DIAGNOSIS — I10 Essential (primary) hypertension: Secondary | ICD-10-CM | POA: Insufficient documentation

## 2020-11-09 DIAGNOSIS — Q828 Other specified congenital malformations of skin: Secondary | ICD-10-CM | POA: Diagnosis not present

## 2020-11-09 MED ORDER — CEPHALEXIN 500 MG PO CAPS: 500.0000 mg | ORAL_CAPSULE | Freq: Three times a day (TID) | ORAL | 0 refills | Status: DC

## 2020-11-09 NOTE — Patient Instructions (Signed)

## 2020-11-14 ENCOUNTER — Encounter (HOSPITAL_COMMUNITY): Payer: Self-pay | Admitting: Physical Therapy

## 2020-11-14 ENCOUNTER — Other Ambulatory Visit: Payer: Self-pay

## 2020-11-14 ENCOUNTER — Ambulatory Visit (HOSPITAL_COMMUNITY): Payer: 59 | Admitting: Physical Therapy

## 2020-11-14 DIAGNOSIS — M533 Sacrococcygeal disorders, not elsewhere classified: Secondary | ICD-10-CM

## 2020-11-14 DIAGNOSIS — M545 Low back pain, unspecified: Secondary | ICD-10-CM

## 2020-11-14 NOTE — Therapy (Addendum)
[place], [place], [address] Phone: [phone]   Fax:  [phone]  Physical Therapy Treatment and Discharge  Patient Details  Name: Kathryn Compton MRN: [id number] Date of Birth: 1951-03-24 Referring Provider (PT): Barton Fanny NP    PHYSICAL THERAPY DISCHARGE SUMMARY  Visits from Start of Care: 4 Current functional level related to goals / functional outcomes: Unable to assess due to unplanned discharge   Remaining deficits: Unable to assess due to unplanned discharge   Education / Equipment: See below Plan: Patient agrees to discharge.  Patient goals were not met. Patient is being discharged due to not returning since the last visit.  ?????         9:07 AM, 02/27/21 Jerene Pitch, DPT Physical Therapy with [facility]  [phone] office    Encounter Date: 11/14/2020   PT End of Session - 11/14/20 0926    Visit Number 4    Number of Visits 12    Date for PT Re-Evaluation 12/01/20    Authorization Type UHC Medicare no VL, no auth    Progress Note Due on Visit 10    PT Start Time 0927    PT Stop Time 0955    PT Time Calculation (min) 28 min    Activity Tolerance Patient tolerated treatment well    Behavior During Therapy [facility] for tasks assessed/performed           Past Medical History:  Diagnosis Date  . Anxiety   . Depression   . Hypertension   . Motion sickness   . Plantar fasciitis   . Seizures ([place])   . Vertigo     Past Surgical History:  Procedure Laterality Date  . BREAST REDUCTION SURGERY    . COLONOSCOPY N/A 09/06/2019   Procedure: COLONOSCOPY;  Surgeon: Danie Binder, MD;  [facility];  Service: Endoscopy;  Laterality: N/A;  2:00  . POLYPECTOMY  09/06/2019   Procedure: POLYPECTOMY;  Surgeon: Danie Binder, MD;  [facility];  Service: Endoscopy;;  . REDUCTION MAMMAPLASTY    . TUBAL LIGATION      There were no  vitals filed for this visit.   Subjective Assessment - 11/14/20 0926    Subjective I feel like I'm getting worse with sudden, sharp shocks of pain in right low back. Reports that increased activity causes increased pain.  Reports that increased stretching causes more pain and lasts for several days.  Reports that she feels better after prolonged rest/sitting    Pertinent History seizures    How long can you stand comfortably? 15-20 minutes at counter, 3 minutes at sink    How long can you walk comfortably? no limitations    Patient Stated Goals to be able to clean and do functional tasks better    Currently in Pain? Yes    Pain Score 6     Pain Location Back    Pain Orientation Right;Lower    Pain Descriptors / Indicators Sharp    Pain Type Chronic pain    Pain Onset More than a month ago    Pain Frequency Constant    Aggravating Factors  standing, walking, prone, stretching    Pain Relieving Factors sitting, forward flexion, fetal position              [facility] PT Assessment - 11/14/20 0001      Assessment   Medical Diagnosis SIJ    Referring Provider (PT)  Barton Fanny NP                         [facility] Adult PT Treatment/Exercise - 11/14/20 0001      Lumbar Exercises: Stretches   Single Knee to Chest Stretch 30 seconds;3 reps    Double Knee to Chest Stretch 3 reps;30 seconds    Lower Trunk Rotation 5 reps;10 seconds    Pelvic Tilt 10 reps;5 seconds   2 sets   Other Lumbar Stretch Exercise child's pose 2 x 2  min      Lumbar Exercises: Supine   Other Supine Lumbar Exercises hip flexion isometric 2x10                  PT Education - 11/14/20 0937    Education Details review of symptoms and encouraged to catalogue symptoms and the activity that seeminly brings them to manifest. Pt educated on anatomy of lumbosacral region    Person(s) Educated Patient    Methods Explanation;Demonstration    Comprehension Verbalized understanding;Returned  demonstration            PT Short Term Goals - 10/20/20 1406      PT SHORT TERM GOAL #1   Title Patient will be able to stand at sink for at least 5 minutes without severe pain to improve standing tolerance while washing dishes    Time 3    Period Weeks    Status New    Target Date 11/10/20      PT SHORT TERM GOAL #2   Title Patient will be independent in self management strategies to improve quality of life and functional outcomes.    Time 3    Period Weeks    Status New    Target Date 11/10/20      PT SHORT TERM GOAL #3   Title Patient will report at least 25% improvement in overall symptoms and/or function to demonstrate improved functional mobility    Time 3    Period Weeks    Status New    Target Date 11/10/20             PT Long Term Goals - 10/20/20 1406      PT LONG TERM GOAL #1   Title Patient will be able to stand at sink for at least 10 minutes without severe pain to improve standing tolerance while washing dishes    Time 6    Period Weeks    Status New    Target Date 12/01/20      PT LONG TERM GOAL #2   Title Patient will report at least 50% improvement in overall symptoms and/or function to demonstrate improved functional mobility    Time 6    Period Weeks    Status New    Target Date 12/01/20      PT LONG TERM GOAL #3   Title Patient will report compliance with walking program at least 5 days a week    Time 6    Period Weeks    Status New    Target Date 12/01/20      PT LONG TERM GOAL #4   Title Patient will be able to lay prone with one pillow under stomach for at least 3 minutes to demonstrate improved psoas length    Time 6    Period Weeks    Status New    Target Date 12/01/20  Plan - 11/14/20 0958    Clinical Impression Statement Progressed activities with flexion-bias for strentgthening to improve trunk stability to prepare for functional lifting activities.  Patient reports more pain with extension-biased  movements and activities.  Demonstrates coordination and strength deficits requiring freuqnet cues for proper position and execution of ther ex maneuvers.    Personal Factors and Comorbidities Comorbidity 1;Comorbidity 2    Comorbidities seizures, SIJ issue    Examination-Activity Limitations Stand;Squat;Lift    Examination-Participation Restrictions Meal Prep;Cleaning;Laundry    Stability/Clinical Decision Making Stable/Uncomplicated    Rehab Potential Good    PT Frequency 2x / week    PT Duration 6 weeks    PT Treatment/Interventions ADLs/Self Care Home Management;Cryotherapy;Electrical Stimulation;Moist Heat;Traction;Therapeutic exercise;Therapeutic activities;Functional mobility training;Gait training;Neuromuscular re-education;Patient/family education;Manual techniques    PT Next Visit Plan gentle psoas lengthening, lumbar flexion when pain presents, hip strengthening (posterior chain),lumbar mobility    PT Home Exercise Plan walking program, DKC, prone lying over pillows; 11/02/20:  TrA sets, thomas stretch, LTR on theraball, lumbar roll instructions, child's pose, hip flexion isometric          Patient will benefit from skilled therapeutic intervention in order to improve the following deficits and impairments:  Pain,Decreased activity tolerance,Decreased strength,Decreased mobility,Decreased range of motion,Postural dysfunction  Visit Diagnosis: Sacroiliac joint pain  Chronic midline low back pain without sciatica     Problem List Patient Active Problem List   Diagnosis Date Noted  . Essential hypertension 11/09/2020  . Hip pain 11/09/2020  . Papanicolaou smear of cervix with positive high risk human papilloma virus (HPV) test 08/01/2020  . Encounter for screening fecal occult blood testing 08/01/2020  . Routine medical exam 08/01/2020  . Encounter for gynecological examination with Papanicolaou smear of cervix 08/01/2020  . Abnormal gait 07/27/2020  . Difficulty balancing  07/27/2020  . Dizziness 07/27/2020  . Sleep apnea 07/27/2020  . Insomnia 06/01/2020  . Obstructive sleep apnea of adult 06/01/2020  . Vitamin D deficiency 05/31/2020  . Anxiety disorder 04/20/2020  . Palpitations 04/20/2020  . Sacroiliac joint pain 04/20/2020  . Seizure ([place]) 04/20/2020  . Syncope and collapse 04/20/2020  . Special screening for malignant neoplasms, colon     10:02 AM, 11/14/20 M. Sherlyn Lees, PT, DPT Physical Therapist- [place] Office Number: [phone]  [facility] [address] [place], [place], [address] Phone: [phone]   Fax:  [phone]  Name: Kathryn Compton MRN: [id number] Date of Birth: 10-Jan-1951

## 2020-11-14 NOTE — Progress Notes (Signed)
Subjective:   Patient ID: Kathryn Compton, female   DOB: 69 y.o.   MRN: [id number]   HPI 69 year old female presents the office today for concerns of ingrown toenails medial aspects of bilateral second toes which are tender.  She is noted some localized swelling but no drainage or pus or red streaks.  She also has a callus or wart on the bottom of her left foot which is tender.  Denies any recent injury or stepping on any foreign objects.  Denies any swelling or redness or any drainage.  No other recent treatment.  No other concerns.  Review of Systems  All other systems reviewed and are negative.  Past Medical History:  Diagnosis Date  . Anxiety   . Depression   . Hypertension   . Motion sickness   . Plantar fasciitis   . Seizures ([place])   . Vertigo     Past Surgical History:  Procedure Laterality Date  . BREAST REDUCTION SURGERY    . COLONOSCOPY N/A 09/06/2019   Procedure: COLONOSCOPY;  Surgeon: Danie Binder, MD;  [facility];  Service: Endoscopy;  Laterality: N/A;  2:00  . POLYPECTOMY  09/06/2019   Procedure: POLYPECTOMY;  Surgeon: Danie Binder, MD;  [facility];  Service: Endoscopy;;  . REDUCTION MAMMAPLASTY    . TUBAL LIGATION       Current Outpatient Medications:  .  amLODipine (NORVASC) 5 MG tablet, Take 5 mg by mouth daily., Disp: , Rfl:  .  cephALEXin (KEFLEX) 500 MG capsule, Take 1 capsule (500 mg total) by mouth 3 (three) times daily., Disp: 21 capsule, Rfl: 0 .  Cholecalciferol (VITAMIN D3) 1.25 MG (50000 UT) CAPS, Take 1 capsule by mouth once a week., Disp: , Rfl:  .  famciclovir (FAMVIR) 500 MG tablet, Take 500 mg by mouth 3 (three) times daily., Disp: , Rfl:  .  hydrOXYzine (ATARAX/VISTARIL) 25 MG tablet, Take 25 mg by mouth 3 (three) times daily as needed., Disp: , Rfl:  .  ibuprofen (ADVIL) 200 MG tablet, Take 200 mg by mouth as needed for headache, mild pain or moderate pain. , Disp: , Rfl:  .  lacosamide (VIMPAT) 50 MG TABS tablet,  Vimpat 50 mg tablet  TAKE 1 TABLET BY MOUTH TWICE DAILY, Disp: , Rfl:  .  levETIRAcetam (KEPPRA) 500 MG tablet, Take 500 mg by mouth 2 (two) times daily., Disp: , Rfl:  .  lisinopril-hydrochlorothiazide (ZESTORETIC) 10-12.5 MG tablet, lisinopril 10 mg-hydrochlorothiazide 12.5 mg tablet  TAKE 1 TABLET BY MOUTH DAILY, Disp: , Rfl:  .  Melatonin 10 MG TABS, Take 10 mg by mouth at bedtime as needed. , Disp: , Rfl:  .  mupirocin ointment (BACTROBAN) 2 %, mupirocin 2 % topical ointment  APPLY TO THE AFFECTED AREA OF BACK UP THREE TIMES DAILY AS NEEDED AND INSIDE TIP OF NOSE EVERY NIGHT AT BEDTIME FOR 1 MONTH, Disp: , Rfl:  .  propranolol (INDERAL) 10 MG tablet, Take 10 mg by mouth 2 (two) times daily., Disp: , Rfl:   Allergies  Allergen Reactions  . Other Itching and Other (See Comments)    Antidepressants-Itching, Gaining Weight           Objective:  Physical Exam  General: AAO x3, NAD  Dermatological: Incurvation present to medial aspect of bilateral second digits.  There is localized edema and erythema likely more from inflammation as opposed to infection there is no drainage or pus or ascending cellulitis.  Punctate annular hyperkeratotic lesion  submetatarsal 2 area the right foot consistent with callus.  There is no underlying foreign body, verruca noted.  No open lesions.  Vascular: Dorsalis Pedis artery and Posterior Tibial artery pedal pulses are 2/4 bilateral with immedate capillary fill time.There is no pain with calf compression, swelling, warmth, erythema.   Neruologic: Grossly intact via light touch bilateral.  Musculoskeletal: No gross boney pedal deformities bilateral. No pain, crepitus, or limitation noted with foot and ankle range of motion bilateral. Muscular strength 5/5 in all groups tested bilateral.  Gait: Unassisted, Nonantalgic.       Assessment:   69 year old female with bilateral second digit ingrown toenails; porokeratosis    Plan:  -Treatment options  discussed including all alternatives, risks, and complications -Etiology of symptoms were discussed -At this time, the patient is requesting partial nail removal with chemical matricectomy to the symptomatic portion of the nail. Risks and complications were discussed with the patient for which they understand and written consent was obtained. Under sterile conditions a total of 3 mL of a mixture of 2% lidocaine plain and 0.5% Marcaine plain was infiltrated in a digital block fashion. Once anesthetized, the skin was prepped in sterile fashion. A tourniquet was then applied. Next the medial aspects of bilateral second digits were then sharply excised making sure to remove the entire offending nail border. Once the nails were ensured to be removed area was debrided and the underlying skin was intact. There is no purulence identified in the procedure. Next phenol was then applied under standard conditions and copiously irrigated. Silvadene was applied. A dry sterile dressing was applied. After application of the dressing the tourniquet was removed and there is found to be an immediate capillary refill time to the digit. The patient tolerated the procedure well any complications. Post procedure instructions were discussed the patient for which he verbally understood. Follow-up in one week for nail check or sooner if any problems are to arise. Discussed signs/symptoms of infection and directed to call the office immediately should any occur or go directly to the emergency room. In the meantime, encouraged to call the office with any questions, concerns, changes symptoms. -Keflex -Debrided hyperkeratotic lesion left foot any complications or bleeding.  Moisturizer daily.  Trula Slade DPM

## 2020-11-16 ENCOUNTER — Ambulatory Visit (HOSPITAL_COMMUNITY): Payer: 59

## 2020-11-20 ENCOUNTER — Telehealth (HOSPITAL_COMMUNITY): Payer: Self-pay | Admitting: Physical Therapy

## 2020-11-20 NOTE — Telephone Encounter (Signed)
pt called to cx all of her appts stated that since the covid cases are rising and she has not vaccinated and does not feel safe coming out right now.

## 2020-11-21 ENCOUNTER — Ambulatory Visit (HOSPITAL_COMMUNITY): Payer: 59

## 2020-11-23 ENCOUNTER — Ambulatory Visit: Payer: 59 | Admitting: Podiatry

## 2020-11-23 ENCOUNTER — Encounter (HOSPITAL_COMMUNITY): Payer: 59

## 2020-11-28 ENCOUNTER — Encounter (HOSPITAL_COMMUNITY): Payer: 59 | Admitting: Physical Therapy

## 2020-11-30 ENCOUNTER — Encounter (HOSPITAL_COMMUNITY): Payer: 59 | Admitting: Physical Therapy

## 2020-12-05 ENCOUNTER — Encounter (HOSPITAL_COMMUNITY): Payer: 59 | Admitting: Physical Therapy

## 2020-12-05 ENCOUNTER — Ambulatory Visit: Payer: 59 | Admitting: Podiatry

## 2020-12-07 ENCOUNTER — Other Ambulatory Visit: Payer: 59

## 2020-12-07 ENCOUNTER — Encounter (HOSPITAL_COMMUNITY): Payer: 59

## 2020-12-07 DIAGNOSIS — Z20822 Contact with and (suspected) exposure to covid-19: Secondary | ICD-10-CM

## 2020-12-08 LAB — SARS-COV-2, NAA 2 DAY TAT

## 2020-12-08 LAB — NOVEL CORONAVIRUS, NAA: SARS-CoV-2, NAA: DETECTED — AB

## 2020-12-12 ENCOUNTER — Encounter (HOSPITAL_COMMUNITY): Payer: 59

## 2020-12-14 ENCOUNTER — Encounter (HOSPITAL_COMMUNITY): Payer: 59

## 2020-12-25 ENCOUNTER — Ambulatory Visit: Payer: 59 | Admitting: Obstetrics and Gynecology

## 2020-12-28 ENCOUNTER — Other Ambulatory Visit: Payer: Self-pay | Admitting: Neurology

## 2020-12-28 DIAGNOSIS — R002 Palpitations: Secondary | ICD-10-CM | POA: Diagnosis not present

## 2020-12-28 DIAGNOSIS — G4733 Obstructive sleep apnea (adult) (pediatric): Secondary | ICD-10-CM | POA: Diagnosis not present

## 2020-12-28 DIAGNOSIS — R569 Unspecified convulsions: Secondary | ICD-10-CM | POA: Diagnosis not present

## 2020-12-28 DIAGNOSIS — E559 Vitamin D deficiency, unspecified: Secondary | ICD-10-CM | POA: Diagnosis not present

## 2020-12-28 DIAGNOSIS — M533 Sacrococcygeal disorders, not elsewhere classified: Secondary | ICD-10-CM | POA: Diagnosis not present

## 2020-12-28 DIAGNOSIS — I10 Essential (primary) hypertension: Secondary | ICD-10-CM | POA: Diagnosis not present

## 2020-12-28 DIAGNOSIS — G4700 Insomnia, unspecified: Secondary | ICD-10-CM | POA: Diagnosis not present

## 2020-12-28 DIAGNOSIS — R55 Syncope and collapse: Secondary | ICD-10-CM | POA: Diagnosis not present

## 2021-01-01 ENCOUNTER — Ambulatory Visit: Payer: 59 | Admitting: Obstetrics and Gynecology

## 2021-01-29 ENCOUNTER — Other Ambulatory Visit (HOSPITAL_COMMUNITY): Payer: Self-pay | Admitting: Neurology

## 2021-01-29 DIAGNOSIS — R42 Dizziness and giddiness: Secondary | ICD-10-CM

## 2021-02-02 ENCOUNTER — Ambulatory Visit (HOSPITAL_COMMUNITY)
Admission: RE | Admit: 2021-02-02 | Discharge: 2021-02-02 | Disposition: A | Discharge status: Home / Self Care | Payer: Medicare Other | Source: Ambulatory Visit | Attending: Internal Medicine | Admitting: Internal Medicine

## 2021-02-02 ENCOUNTER — Other Ambulatory Visit: Payer: Self-pay

## 2021-02-02 DIAGNOSIS — R42 Dizziness and giddiness: Secondary | ICD-10-CM | POA: Insufficient documentation

## 2021-02-02 DIAGNOSIS — I10 Essential (primary) hypertension: Secondary | ICD-10-CM | POA: Diagnosis not present

## 2021-02-02 DIAGNOSIS — R002 Palpitations: Secondary | ICD-10-CM | POA: Diagnosis not present

## 2021-02-02 DIAGNOSIS — Z1389 Encounter for screening for other disorder: Secondary | ICD-10-CM | POA: Diagnosis not present

## 2021-02-02 DIAGNOSIS — Z0001 Encounter for general adult medical examination with abnormal findings: Secondary | ICD-10-CM | POA: Diagnosis not present

## 2021-02-06 DIAGNOSIS — I10 Essential (primary) hypertension: Secondary | ICD-10-CM | POA: Diagnosis not present

## 2021-02-06 DIAGNOSIS — R739 Hyperglycemia, unspecified: Secondary | ICD-10-CM | POA: Diagnosis not present

## 2021-02-06 DIAGNOSIS — Z0001 Encounter for general adult medical examination with abnormal findings: Secondary | ICD-10-CM | POA: Diagnosis not present

## 2021-02-06 LAB — LIPID PANEL
Cholesterol: 169 (ref 0–200)
HDL: 30 — AB (ref 35–70)
LDL Cholesterol: 104
Triglycerides: 230 — AB (ref 40–160)

## 2021-02-06 LAB — HEMOGLOBIN A1C: Hemoglobin A1C: 5.4

## 2021-02-06 LAB — BASIC METABOLIC PANEL
BUN: 22 — AB (ref 4–21)
Creatinine: 0.8 (ref 0.5–1.1)

## 2021-02-06 LAB — COMPREHENSIVE METABOLIC PANEL: Calcium: 9.7 (ref 8.7–10.7)

## 2021-02-06 LAB — TSH: TSH: 2.49 (ref 0.41–5.90)

## 2021-02-08 DIAGNOSIS — R002 Palpitations: Secondary | ICD-10-CM | POA: Diagnosis not present

## 2021-02-08 DIAGNOSIS — I10 Essential (primary) hypertension: Secondary | ICD-10-CM | POA: Diagnosis not present

## 2021-02-08 DIAGNOSIS — R55 Syncope and collapse: Secondary | ICD-10-CM | POA: Diagnosis not present

## 2021-02-08 DIAGNOSIS — M533 Sacrococcygeal disorders, not elsewhere classified: Secondary | ICD-10-CM | POA: Diagnosis not present

## 2021-02-08 DIAGNOSIS — G4700 Insomnia, unspecified: Secondary | ICD-10-CM | POA: Diagnosis not present

## 2021-02-08 DIAGNOSIS — G4733 Obstructive sleep apnea (adult) (pediatric): Secondary | ICD-10-CM | POA: Diagnosis not present

## 2021-02-08 DIAGNOSIS — R569 Unspecified convulsions: Secondary | ICD-10-CM | POA: Diagnosis not present

## 2021-02-08 DIAGNOSIS — E559 Vitamin D deficiency, unspecified: Secondary | ICD-10-CM | POA: Diagnosis not present

## 2021-03-05 DIAGNOSIS — I10 Essential (primary) hypertension: Secondary | ICD-10-CM | POA: Diagnosis not present

## 2021-03-28 DIAGNOSIS — M545 Low back pain, unspecified: Secondary | ICD-10-CM | POA: Diagnosis not present

## 2021-03-28 DIAGNOSIS — M532X7 Spinal instabilities, lumbosacral region: Secondary | ICD-10-CM | POA: Diagnosis not present

## 2021-04-09 DIAGNOSIS — M4696 Unspecified inflammatory spondylopathy, lumbar region: Secondary | ICD-10-CM | POA: Diagnosis not present

## 2021-04-09 DIAGNOSIS — M545 Low back pain, unspecified: Secondary | ICD-10-CM | POA: Diagnosis not present

## 2021-04-10 ENCOUNTER — Other Ambulatory Visit: Payer: Self-pay | Admitting: Orthopedic Surgery

## 2021-04-10 DIAGNOSIS — M545 Low back pain, unspecified: Secondary | ICD-10-CM

## 2021-04-10 DIAGNOSIS — M4696 Unspecified inflammatory spondylopathy, lumbar region: Secondary | ICD-10-CM

## 2021-04-12 DIAGNOSIS — E559 Vitamin D deficiency, unspecified: Secondary | ICD-10-CM | POA: Diagnosis not present

## 2021-04-12 DIAGNOSIS — R569 Unspecified convulsions: Secondary | ICD-10-CM | POA: Diagnosis not present

## 2021-04-12 DIAGNOSIS — R002 Palpitations: Secondary | ICD-10-CM | POA: Diagnosis not present

## 2021-04-12 DIAGNOSIS — G4733 Obstructive sleep apnea (adult) (pediatric): Secondary | ICD-10-CM | POA: Diagnosis not present

## 2021-04-12 DIAGNOSIS — G4700 Insomnia, unspecified: Secondary | ICD-10-CM | POA: Diagnosis not present

## 2021-04-12 DIAGNOSIS — I10 Essential (primary) hypertension: Secondary | ICD-10-CM | POA: Diagnosis not present

## 2021-04-12 DIAGNOSIS — R55 Syncope and collapse: Secondary | ICD-10-CM | POA: Diagnosis not present

## 2021-04-12 DIAGNOSIS — M533 Sacrococcygeal disorders, not elsewhere classified: Secondary | ICD-10-CM | POA: Diagnosis not present

## 2021-04-26 ENCOUNTER — Ambulatory Visit
Admission: RE | Admit: 2021-04-26 | Discharge: 2021-04-26 | Disposition: A | Discharge status: Home / Self Care | Payer: Medicare Other | Source: Ambulatory Visit | Attending: Orthopedic Surgery | Admitting: Orthopedic Surgery

## 2021-04-26 ENCOUNTER — Other Ambulatory Visit: Payer: Self-pay

## 2021-04-26 DIAGNOSIS — M4696 Unspecified inflammatory spondylopathy, lumbar region: Secondary | ICD-10-CM

## 2021-04-26 DIAGNOSIS — M545 Low back pain, unspecified: Secondary | ICD-10-CM | POA: Diagnosis not present

## 2021-04-27 DIAGNOSIS — M160 Bilateral primary osteoarthritis of hip: Secondary | ICD-10-CM | POA: Diagnosis not present

## 2021-04-27 DIAGNOSIS — I10 Essential (primary) hypertension: Secondary | ICD-10-CM | POA: Diagnosis not present

## 2021-04-27 DIAGNOSIS — M47816 Spondylosis without myelopathy or radiculopathy, lumbar region: Secondary | ICD-10-CM | POA: Diagnosis not present

## 2021-05-03 ENCOUNTER — Other Ambulatory Visit: Payer: Self-pay | Admitting: Physical Medicine and Rehabilitation

## 2021-05-09 ENCOUNTER — Ambulatory Visit (INDEPENDENT_AMBULATORY_CARE_PROVIDER_SITE_OTHER): Payer: Medicare Other | Admitting: Family Medicine

## 2021-05-09 ENCOUNTER — Other Ambulatory Visit (HOSPITAL_COMMUNITY)
Admission: RE | Admit: 2021-05-09 | Discharge: 2021-05-09 | Disposition: A | Discharge status: Home / Self Care | Payer: Medicare Other | Source: Ambulatory Visit | Attending: Obstetrics and Gynecology | Admitting: Obstetrics and Gynecology

## 2021-05-09 ENCOUNTER — Other Ambulatory Visit: Payer: Self-pay

## 2021-05-09 ENCOUNTER — Encounter: Payer: Self-pay | Admitting: Family Medicine

## 2021-05-09 VITALS — BP 150/68 | HR 74 | Ht 67.0 in | Wt 285.9 lb

## 2021-05-09 DIAGNOSIS — R87810 Cervical high risk human papillomavirus (HPV) DNA test positive: Secondary | ICD-10-CM

## 2021-05-09 DIAGNOSIS — F419 Anxiety disorder, unspecified: Secondary | ICD-10-CM

## 2021-05-09 NOTE — Patient Instructions (Signed)
Colposcopy, Care After This sheet gives you information about how to care for yourself after your procedure. Your doctor may also give you more specific instructions. If you have problems or questions, contact your doctor. What can I expect after the procedure? If you did not have a sample of your tissue taken out (did not have a biopsy), you may only have some spotting of blood for a few days. You can go back to your normal activities. If you had a sample of your tissue taken out, it is common to have:  Soreness and mild pain. These may last for a few days.  A light-headed feeling.  Mild bleeding or fluid (discharge) coming from your vagina. The fluid will look dark and grainy. You may have this for a few days. The fluid may be caused by a liquid that was used during your procedure. You may need to wear a sanitary pad.  Spotting of blood for at least 48 hours after the procedure. Follow these instructions at home: Medicines  Take over-the-counter and prescription medicines only as told by your doctor.  Ask your doctor what medicines you can start taking again. This is very important if you take blood thinners. Activity  Limit your activity for the first day after your procedure as told by your doctor.  For at least 3 days, or for as long as told by your doctor, avoid: ? Douching. ? Using tampons. ? Having sex.  Return to your normal activities as told by your doctor. Ask your doctor what activities are safe for you. General instructions  Drink enough fluid to keep your pee (urine) pale yellow.  Ask your doctor if you may take baths, swim, or use a hot tub. You may take showers.  If you use birth control (contraception), keep using it.  Keep all follow-up visits as told by your doctor. This is important.   Contact a doctor if:  You get a skin rash. Get help right away if:  You bleed a lot from your vagina. A lot of bleeding means you use more than one pad an hour for 2  hours in a row.  You have clumps of blood (blood clots) coming from your vagina.  You have a fever or chills.  You have signs of infection. This may be fluid coming from your vagina that is: ? Different than normal. ? Yellow. ? Bad-smelling.  You have very bad pain or cramps in your lower belly that do not get better with medicine.  You faint. Summary  If you did not have a sample of your tissue taken out, you may only have some spotting of blood for a few days. You can go back to your normal activities.  If you had a sample of your tissue taken out, it is common to have mild pain for a few days and spotting for 48 hours.  Avoid douching, using tampons, and having sex for at least 3 days after the procedure or for as long as told.  Get help right away if you have a lot of bleeding, very bad pain, or signs of infection. This information is not intended to replace advice given to you by your health care provider. Make sure you discuss any questions you have with your health care provider. Document Revised: 09/19/2020 Document Reviewed: 11/17/2019 Elsevier Patient Education  2021 [employer].

## 2021-05-09 NOTE — Progress Notes (Signed)
    GYNECOLOGY OFFICE COLPOSCOPY PROCEDURE NOTE  70 y.o. [id number] here for colposcopy for normal  pap smear with Positive HPV, negative 16,18,45 on 07/2020. 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  Patient gave informed written consent, time out was performed.  Placed in lithotomy position. Cervix viewed with speculum and colposcope after application of acetic acid.   Colposcopy adequate? Yes  no visible lesions; biopsies obtained in 4 quadrants  ECC specimen obtained. All specimens were labeled and sent to pathology.  Patient was given post procedure instructions.  Will follow up pathology and manage accordingly; patient will be contacted with results and recommendations.  Routine preventative health maintenance measures emphasized.    Donnamae Jude, MD 05/09/2021 4:30 PM

## 2021-05-10 ENCOUNTER — Other Ambulatory Visit: Payer: Self-pay | Admitting: Physical Medicine and Rehabilitation

## 2021-05-10 DIAGNOSIS — M160 Bilateral primary osteoarthritis of hip: Secondary | ICD-10-CM

## 2021-05-11 ENCOUNTER — Telehealth: Payer: Self-pay

## 2021-05-11 LAB — SURGICAL PATHOLOGY

## 2021-05-11 NOTE — Telephone Encounter (Signed)
Spoke to patient and informed on results with recommendation of repeating pap in a year from the biopsy.  Patient understood. Patient verified DOB.  Informed patient to give [place] a call or MyChart if she have any question.  Francisco Capuchin, [place] 05/11/2021

## 2021-05-11 NOTE — Telephone Encounter (Signed)
-----   Message from Donnamae Jude, MD sent at 05/11/2021 10:31 AM EDT ----- Recall pap in 1 year.

## 2021-05-16 DIAGNOSIS — M1711 Unilateral primary osteoarthritis, right knee: Secondary | ICD-10-CM | POA: Diagnosis not present

## 2021-05-16 DIAGNOSIS — M1712 Unilateral primary osteoarthritis, left knee: Secondary | ICD-10-CM | POA: Diagnosis not present

## 2021-05-17 DIAGNOSIS — E559 Vitamin D deficiency, unspecified: Secondary | ICD-10-CM | POA: Diagnosis not present

## 2021-05-17 DIAGNOSIS — R55 Syncope and collapse: Secondary | ICD-10-CM | POA: Diagnosis not present

## 2021-05-17 DIAGNOSIS — M533 Sacrococcygeal disorders, not elsewhere classified: Secondary | ICD-10-CM | POA: Diagnosis not present

## 2021-05-17 DIAGNOSIS — G4700 Insomnia, unspecified: Secondary | ICD-10-CM | POA: Diagnosis not present

## 2021-05-17 DIAGNOSIS — Z79899 Other long term (current) drug therapy: Secondary | ICD-10-CM | POA: Diagnosis not present

## 2021-05-17 DIAGNOSIS — I10 Essential (primary) hypertension: Secondary | ICD-10-CM | POA: Diagnosis not present

## 2021-05-17 DIAGNOSIS — G4733 Obstructive sleep apnea (adult) (pediatric): Secondary | ICD-10-CM | POA: Diagnosis not present

## 2021-05-17 DIAGNOSIS — R569 Unspecified convulsions: Secondary | ICD-10-CM | POA: Diagnosis not present

## 2021-05-17 DIAGNOSIS — R002 Palpitations: Secondary | ICD-10-CM | POA: Diagnosis not present

## 2021-05-22 ENCOUNTER — Other Ambulatory Visit: Payer: Self-pay

## 2021-05-22 ENCOUNTER — Ambulatory Visit
Admission: RE | Admit: 2021-05-22 | Discharge: 2021-05-22 | Disposition: A | Discharge status: Home / Self Care | Payer: Medicare Other | Source: Ambulatory Visit | Attending: Physical Medicine and Rehabilitation | Admitting: Physical Medicine and Rehabilitation

## 2021-05-22 DIAGNOSIS — M25551 Pain in right hip: Secondary | ICD-10-CM | POA: Diagnosis not present

## 2021-05-22 DIAGNOSIS — M160 Bilateral primary osteoarthritis of hip: Secondary | ICD-10-CM

## 2021-05-24 DIAGNOSIS — M47817 Spondylosis without myelopathy or radiculopathy, lumbosacral region: Secondary | ICD-10-CM | POA: Diagnosis not present

## 2021-05-24 DIAGNOSIS — M47816 Spondylosis without myelopathy or radiculopathy, lumbar region: Secondary | ICD-10-CM | POA: Diagnosis not present

## 2021-05-28 DIAGNOSIS — I10 Essential (primary) hypertension: Secondary | ICD-10-CM | POA: Diagnosis not present

## 2021-06-01 DIAGNOSIS — I10 Essential (primary) hypertension: Secondary | ICD-10-CM | POA: Diagnosis not present

## 2021-06-12 DIAGNOSIS — M47816 Spondylosis without myelopathy or radiculopathy, lumbar region: Secondary | ICD-10-CM | POA: Diagnosis not present

## 2021-06-18 ENCOUNTER — Other Ambulatory Visit: Payer: Self-pay | Admitting: *Deleted

## 2021-06-18 ENCOUNTER — Encounter: Payer: Self-pay | Admitting: *Deleted

## 2021-06-19 ENCOUNTER — Other Ambulatory Visit: Payer: Self-pay

## 2021-06-19 ENCOUNTER — Ambulatory Visit (INDEPENDENT_AMBULATORY_CARE_PROVIDER_SITE_OTHER): Payer: Medicare Other | Admitting: Neurology

## 2021-06-19 ENCOUNTER — Encounter: Payer: Self-pay | Admitting: Neurology

## 2021-06-19 ENCOUNTER — Encounter: Payer: Self-pay | Admitting: Adult Health

## 2021-06-19 VITALS — BP 132/83 | HR 73 | Ht 67.0 in | Wt 284.5 lb

## 2021-06-19 DIAGNOSIS — R42 Dizziness and giddiness: Secondary | ICD-10-CM

## 2021-06-19 DIAGNOSIS — R519 Headache, unspecified: Secondary | ICD-10-CM | POA: Insufficient documentation

## 2021-06-19 NOTE — Progress Notes (Signed)
Chief Complaint  Patient presents with   New Patient (Initial Visit)    Room 16 with significant other, Heinz Knuckles. She is here to establish new neurology care. Reports being diagnosed with seizures in June 2021. She has only had loss consciousness once (October 2020). States she is having the following episodes: random, intermittent pain radiating through head; pressure in head; whooshing in her ears. Symptoms last a few minutes then she feels drained for hours afterwards. Tried Keppra, Briviact, Vimpat, Xcopri.      ASSESSMENT AND PLAN  Kathryn Compton is a 70 y.o. female   Long history of intermittent described episode, with frequent occurrence,  That can be related to the positional change, highly doubt is truly represent seizure  Previously described abnormal EEG,  Not responsive to different epileptic medications, also could not tolerate medication due to side effect,  I have advised her to taper off Onfi 10 mg every night,  Refer her to outpatient video-eeg monitoring to clarify the event   DIAGNOSTIC DATA (LABS, IMAGING, TESTING) - I reviewed patient records, labs, notes, testing and imaging myself where available.  EEG on June 27 2020. 1.  This recording of the awake and drowsy state shows infrequent sharp wave activity involving the left anterior temporal region.  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  MRI brain w/wo in Nov 2021, was normal.  MRI of lumbar in May 2022, 1. Mild right subarticular and foraminal narrowing at L5-S1 secondary to a rightward disc protrusion. 2. Mild disc bulging and facet hypertrophy at L2-3, L3-4, and L4-5 without significant stenosis at these levels.  MRI of right hip on May 22, 2021 1.  No acute osseous injury of the right hip. 2. Small right superior labral tear. 3. Small amount of fluid in the greater trochanteric bursa bilaterally. 4. Lower lumbar spine spondylosis.   Laboratory evaluation  in March 2022, normal liver function test, TSH 2.5, CBC with hemoglobin of 13, A1C 5.4, triglycerides 230, LDL 104   MEDICAL HISTORY:  Kathryn Compton is a 70 year old female, seen in request by her primary care physician Dr. Legrand Rams, [place] Melnick, for evaluation of possible seizure, she is accompanied by her significant other Heinz Knuckles at today's visit on June 20, 2021.  I reviewed and summarized the referring note.PMHx. HTN  She reported intermittent spells that has similar semiology since age 40s, she describes episode as starting off at the back of her neck, then spreading to left occipital region, bilateral shoulder, she felt the pressure building up, she has to stop to wait for his past, mild pressure pain lasting for few minutes, but made her feel better at rest of the day, she did not lose consciousness with those episode, there was no body shaking episode, the only thing she can do when that happened is wait for it to pass,  For a while, she had frequent episode, it is usually triggered when standing up after prolonged sitting, she had it almost daily basis, especially around 4 of 2021,  She was seen by local neurologist Dr. Janann Colonel, was diagnosed with possible seizure, based on abnormal EEG, I was not able to review tracing, EEG report by Dr. Janann Colonel in July 2021 described infrequent sharp waves at left anterior temporal region,  Reported normal MRI of the brain with without contrast in November 2022  She was treated with different antiepileptic medication since then, oftentimes cannot tolerate epileptic medications due to side effect Keppra,-groggy throughout the day Briviact October  2021, feeling tired, moody, angry easily Vimpat from October 2021 to March 2022, it seems to help her symptoms, but after she got her COVID-vaccine in March 2022, she began to develop side effect taking Vimpat 50 mg twice a day, complains of dizziness, also worsening mood disorder," wanting to die", excessive  sleepiness, and fatigue Milly Jakob, May 2022, feeling tired, increased depression, head pressure She was started on thin 10 mg every night since June 08, 2021, it makes her very tired sleepy at nighttime, singly during the day,  She continues to have similar spells, especially every time she got up," I got that feeling coming on", she will stand there for a few seconds, to let it pass,  She was even given prescription of diazepam nasal spray 20 mg dose, she used it few times, seems to help her symptoms some, but complains of sleepiness afterwards   PHYSICAL EXAM:   Vitals:   06/19/21 1416  BP: 132/83  Pulse: 73  Weight: 284 lb 8 oz (129 kg)  Height: 5\' 7"  (1.702 m)   Sitting down 150/78, HR 67,  Standing up  153/75, HR 80 Standing up in 3 minutes, 158/88, HR 80   Body mass index is 44.56 kg/m.  PHYSICAL EXAMNIATION:  Gen: NAD, conversant, well nourised, well groomed                     Cardiovascular: Regular rate rhythm, no peripheral edema, warm, nontender. Eyes: Conjunctivae clear without exudates or hemorrhage Neck: Supple, no carotid bruits. Pulmonary: Clear to auscultation bilaterally   NEUROLOGICAL EXAM:  MENTAL STATUS: Speech:    Speech is normal; fluent and spontaneous with normal comprehension.  Cognition:     Orientation to time, place and person     Normal recent and remote memory     Normal Attention span and concentration     Normal Language, naming, repeating,spontaneous speech     Fund of knowledge   CRANIAL NERVES: CN II: Visual fields are full to confrontation. Pupils are round equal and briskly reactive to light. CN III, IV, VI: extraocular movement are normal. No ptosis. CN V: Facial sensation is intact to light touch CN VII: Face is symmetric with normal eye closure  CN VIII: Hearing is normal to causal conversation. CN IX, X: Phonation is normal. CN XI: Head turning and shoulder shrug are intact  MOTOR: There is no pronator drift of  out-stretched arms. Muscle bulk and tone are normal. Muscle strength is normal.  REFLEXES: Reflexes are 2+ and symmetric at the biceps, triceps, knees, and ankles. Plantar responses are flexor.  SENSORY: Intact to light touch, pinprick and vibratory sensation are intact in fingers and toes.  COORDINATION: There is no trunk or limb dysmetria noted.  GAIT/STANCE: Need push-up to get up from seated position, antalgic  REVIEW OF SYSTEMS:  Full 14 system review of systems performed and notable only for as above All other review of systems were negative.   ALLERGIES: Allergies  Allergen Reactions   Other Itching and Other (See Comments)    Antidepressants-Itching, Gaining Weight     HOME MEDICATIONS: Current Outpatient Medications  Medication Sig Dispense Refill   cloBAZam (ONFI) 10 MG tablet Take by mouth at bedtime. Taking 2.5mg  at bedtime. Slowing titrating to goal of 10mg  QHS.     diazePAM, 20 MG Dose, (VALTOCO 20 MG DOSE) 2 x 10 MG/0.1ML LQPK Valtoco 20 mg/2 spray (10mg /0.3mL x2) nasal spray  TAKE 1 SPRAY UP TO TWICE DAILY AS  NEEDED AS DIRECTED     hydrOXYzine (ATARAX/VISTARIL) 25 MG tablet Take 25 mg by mouth 3 (three) times daily as needed.     propranolol (INDERAL) 10 MG tablet Take 10 mg by mouth 2 (two) times daily.     No current facility-administered medications for this visit.    PAST MEDICAL HISTORY: Past Medical History:  Diagnosis Date   Anxiety    Depression    Hypertension    Motion sickness    Palpitations    Plantar fasciitis    Seizures ([place])    Vertigo     PAST SURGICAL HISTORY: Past Surgical History:  Procedure Laterality Date   BREAST REDUCTION SURGERY     COLONOSCOPY N/A 09/06/2019   Procedure: COLONOSCOPY;  Surgeon: Danie Binder, MD;  Location: [facility];  Service: Endoscopy;  Laterality: N/A;  2:00   POLYPECTOMY  09/06/2019   Procedure: POLYPECTOMY;  Surgeon: Danie Binder, MD;  Location: [facility];  Service: Endoscopy;;    REDUCTION MAMMAPLASTY     TUBAL LIGATION      FAMILY HISTORY: Family History  Problem Relation Age of Onset   Other Mother        unsure of medical history   Other Father        unknown medical history   Drug abuse Brother    Suicidality Brother    Colon cancer Neg Hx     SOCIAL HISTORY: Social History   Socioeconomic History   Marital status: Single    Spouse name: Not on file   Number of children: 2   Years of education: 12   Highest education level: High school graduate  Occupational History   Occupation: Retired  Tobacco Use   Smoking status: Never   Smokeless tobacco: Never  Scientific laboratory technician Use: Never used  Substance and Sexual Activity   Alcohol use: Not Currently   Drug use: Never   Sexual activity: Yes    Birth control/protection: Surgical    Comment: tubal  Other Topics Concern   Not on file  Social History Narrative   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   Right-handed.   Lives with significant other.   One cup caffeine per day, occasional glass of tea.   Social Determinants of Health   Financial Resource Strain: Medium Risk   Difficulty of Paying Living Expenses: Somewhat hard  Food Insecurity: No Food Insecurity   Worried About Charity fundraiser in the Last Year: Never true   Ran Out of Food in the Last Year: Never true  Transportation Needs: No Transportation Needs   Lack of Transportation (Medical): No   Lack of Transportation (Non-Medical): No  Physical Activity: Inactive   Days of Exercise per Week: 0 days   Minutes of Exercise per Session: 0 min  Stress: Stress Concern Present   Feeling of Stress : Very much  Social Connections: Socially Isolated   Frequency of Communication with Friends and Family: More than three times a week   Frequency of Social Gatherings with Friends and Family: More than three times a week   Attends Religious Services: Never   Marine scientist or Organizations: No   Attends Theatre manager Meetings: Never   Marital Status: Divorced  Human resources officer Violence: Not At Risk   Fear of Current or Ex-Partner: No   Emotionally Abused: No   Physically Abused: No   Sexually Abused: No      Tiare Rohlman  Krista Blue, M.D. Ph.D.  [facility] [address], [place], [place] [address] Ph: [phone] Fax: [phone]  CC:  Rosita Fire, MD [place],  [address] [id number]  Rosita Fire, MD

## 2021-06-20 ENCOUNTER — Telehealth: Payer: Self-pay | Admitting: *Deleted

## 2021-06-20 NOTE — Telephone Encounter (Signed)
72-hr video EEG orders signed by Dr. Krista Blue and faxed to [facility].  Ph: [phone], Fax: [phone]  Once approved by insurance, they will reach out to the patient to schedule.  Results will be faxed to Dr. Krista Blue and emailed.

## 2021-06-25 ENCOUNTER — Telehealth: Payer: Self-pay | Admitting: Neurology

## 2021-06-25 NOTE — Telephone Encounter (Signed)
Pt called stating that she received a missed appt letter but she stated that she did not miss that appt. I looked in the pts chart and she did in fact received this letter but she has never been scheduled with NP Frann Rider on 06/19/21 or ever and the pt has only been here once and was seen on 7/19 but with Dr. Krista Blue. The pt asked for this to be investigated and taken off her chart. Please advise.

## 2021-07-02 DIAGNOSIS — I10 Essential (primary) hypertension: Secondary | ICD-10-CM | POA: Diagnosis not present

## 2021-07-02 NOTE — Telephone Encounter (Signed)
Order in chart is for a routine EEG, does pt need routine or 72 hour EEG? If 72 hours, pt has to wait for [facility] to reach out to her.

## 2021-07-02 NOTE — Telephone Encounter (Signed)
I called [facility] [phone]) and spoke to Tammy. She said once the insurance approves the procedure, they will contact the patient to schedule.  I called the patient and provided her with his information. The patient tells me she received notice of approval in the mail over the weekend.    I called Tammy again and gave her this update. She does not have the approval yet but will contact the patient's insurance plan to check on it. She will call the patient to schedule once she has confirmed the authorization.

## 2021-07-02 NOTE — Telephone Encounter (Signed)
Pt called, to schedule EEG. Would like a call a back.

## 2021-07-02 NOTE — Telephone Encounter (Signed)
Pt has called back to inform that [employer] informed her that the doctors office needs to call her to schedule her 72 hours EEG not them, Pt is asking if RN can call them to straightened this out for her.

## 2021-07-02 NOTE — Telephone Encounter (Signed)
Her orders have been faxed to [employer]. They will get the procedure approved through her insurance plan and will call to schedule her.

## 2021-07-12 DIAGNOSIS — M47816 Spondylosis without myelopathy or radiculopathy, lumbar region: Secondary | ICD-10-CM | POA: Diagnosis not present

## 2021-07-12 DIAGNOSIS — M47817 Spondylosis without myelopathy or radiculopathy, lumbosacral region: Secondary | ICD-10-CM | POA: Diagnosis not present

## 2021-07-16 ENCOUNTER — Other Ambulatory Visit: Payer: Self-pay | Admitting: Gerontology

## 2021-07-16 ENCOUNTER — Other Ambulatory Visit (HOSPITAL_COMMUNITY): Payer: Self-pay | Admitting: Gerontology

## 2021-07-16 DIAGNOSIS — R42 Dizziness and giddiness: Secondary | ICD-10-CM

## 2021-07-16 DIAGNOSIS — I10 Essential (primary) hypertension: Secondary | ICD-10-CM | POA: Diagnosis not present

## 2021-07-19 ENCOUNTER — Ambulatory Visit (HOSPITAL_COMMUNITY)
Admission: RE | Admit: 2021-07-19 | Discharge: 2021-07-19 | Disposition: A | Discharge status: Home / Self Care | Payer: Medicare Other | Source: Ambulatory Visit | Attending: Gerontology | Admitting: Gerontology

## 2021-07-19 ENCOUNTER — Other Ambulatory Visit: Payer: Self-pay

## 2021-07-19 DIAGNOSIS — I6523 Occlusion and stenosis of bilateral carotid arteries: Secondary | ICD-10-CM | POA: Diagnosis not present

## 2021-07-19 DIAGNOSIS — R42 Dizziness and giddiness: Secondary | ICD-10-CM | POA: Diagnosis not present

## 2021-07-19 DIAGNOSIS — I10 Essential (primary) hypertension: Secondary | ICD-10-CM | POA: Diagnosis not present

## 2021-07-23 NOTE — Telephone Encounter (Signed)
Kathryn Compton returned the call from [facility]. They can proceed with the prolonged EEG monitoring without the video camera. The patient is aware.

## 2021-07-23 NOTE — Telephone Encounter (Signed)
The patient does not wish to move forward with the video EEG. She is too uncomfortable having a camera on her. She is agreeable to have an extended, ambulatory EEG and is asking for the orders to be changed.

## 2021-07-23 NOTE — Telephone Encounter (Signed)
Per vo by Dr. Krista Blue, okay to complete the ambulatory EEG without video. Per the patient, [facility] is schedule to deliver the equipment for hook-up on 07/27/21. I called and left a voicemail for [facility] (appt scheduler) to call me back. We need to see if they can complete the test without the video. If not, then the patient will need a new referral.  [facility] Ph: [phone]

## 2021-07-23 NOTE — Telephone Encounter (Signed)
Pt called, feel uncomfortable with a camera on me to entire time for the EEG. Would like the EEG, but do not want the camera. Can you call the company to cancel the camera? Would like a call from the nurse.

## 2021-07-26 DIAGNOSIS — M47816 Spondylosis without myelopathy or radiculopathy, lumbar region: Secondary | ICD-10-CM | POA: Diagnosis not present

## 2021-07-26 DIAGNOSIS — M47817 Spondylosis without myelopathy or radiculopathy, lumbosacral region: Secondary | ICD-10-CM | POA: Diagnosis not present

## 2021-07-27 DIAGNOSIS — R404 Transient alteration of awareness: Secondary | ICD-10-CM | POA: Diagnosis not present

## 2021-07-28 DIAGNOSIS — R404 Transient alteration of awareness: Secondary | ICD-10-CM | POA: Diagnosis not present

## 2021-07-29 DIAGNOSIS — R404 Transient alteration of awareness: Secondary | ICD-10-CM | POA: Diagnosis not present

## 2021-07-30 DIAGNOSIS — R569 Unspecified convulsions: Secondary | ICD-10-CM | POA: Diagnosis not present

## 2021-07-30 NOTE — Telephone Encounter (Signed)
She was set up for her 72-hr EEG on 07/27/21  through [facility]. She is aware we will call her once the results are available.

## 2021-07-30 NOTE — Telephone Encounter (Signed)
Pt called stating she didn't know if she needed to schedule an appt to go over the results of her EEG. Pt requesting a call back.

## 2021-07-31 ENCOUNTER — Ambulatory Visit (INDEPENDENT_AMBULATORY_CARE_PROVIDER_SITE_OTHER): Payer: Medicare Other | Admitting: "Endocrinology

## 2021-07-31 ENCOUNTER — Other Ambulatory Visit: Payer: Self-pay

## 2021-07-31 ENCOUNTER — Encounter: Payer: Self-pay | Admitting: "Endocrinology

## 2021-07-31 DIAGNOSIS — I10 Essential (primary) hypertension: Secondary | ICD-10-CM | POA: Diagnosis not present

## 2021-07-31 DIAGNOSIS — Z6841 Body Mass Index (BMI) 40.0 and over, adult: Secondary | ICD-10-CM

## 2021-07-31 MED ORDER — QSYMIA 7.5-46 MG PO CP24: 1.0000 | ORAL_CAPSULE | Freq: Every day | ORAL | 2 refills | Status: DC

## 2021-07-31 MED ORDER — PHENTERMINE-TOPIRAMATE ER 3.75-23 MG PO CP24: 1.0000 | ORAL_CAPSULE | Freq: Every day | ORAL | 0 refills | Status: DC

## 2021-07-31 NOTE — Progress Notes (Signed)
Endocrinology Consult Note                                            07/31/2021, 6:06 PM   Subjective:    Patient ID: Kathryn Compton, female    DOB: [birth year], PCP Rosita Fire, MD   Past Medical History:  Diagnosis Date   Anxiety    Depression    Hypertension    Motion sickness    Palpitations    Plantar fasciitis    Seizures ([place])    Vertigo    Past Surgical History:  Procedure Laterality Date   BREAST REDUCTION SURGERY     COLONOSCOPY N/A 09/06/2019   Procedure: COLONOSCOPY;  Surgeon: Danie Binder, MD;  Location: [facility];  Service: Endoscopy;  Laterality: N/A;  2:00   POLYPECTOMY  09/06/2019   Procedure: POLYPECTOMY;  Surgeon: Danie Binder, MD;  Location: [facility];  Service: Endoscopy;;   REDUCTION MAMMAPLASTY     TUBAL LIGATION     Social History   Socioeconomic History   Marital status: Single    Spouse name: Not on file   Number of children: 2   Years of education: 12   Highest education level: High school graduate  Occupational History   Occupation: Retired  Tobacco Use   Smoking status: Never   Smokeless tobacco: Never  Vaping Use   Vaping Use: Never used  Substance and Sexual Activity   Alcohol use: Not Currently   Drug use: Never   Sexual activity: Yes    Birth control/protection: Surgical    Comment: tubal  Other Topics Concern   Not on file  Social History Narrative   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   Right-handed.   Lives with significant other.   One cup caffeine per day, occasional glass of tea.   Social Determinants of Health   Financial Resource Strain: Medium Risk   Difficulty of Paying Living Expenses: Somewhat hard  Food Insecurity: No Food Insecurity   Worried About Charity fundraiser in the Last Year: Never true   Ran Out of Food in the Last Year: Never true  Transportation Needs: No Transportation Needs   Lack of Transportation (Medical): No   Lack of Transportation  (Non-Medical): No  Physical Activity: Inactive   Days of Exercise per Week: 0 days   Minutes of Exercise per Session: 0 min  Stress: Stress Concern Present   Feeling of Stress : Very much  Social Connections: Socially Isolated   Frequency of Communication with Friends and Family: More than three times a week   Frequency of Social Gatherings with Friends and Family: More than three times a week   Attends Religious Services: Never   Marine scientist or Organizations: No   Attends Music therapist: Never   Marital Status: Divorced   Family History  Problem Relation Age of Onset   Other Mother        unsure of medical history   Other Father        unknown medical history   Drug abuse Brother    Suicidality Brother    Colon cancer Neg Hx    Outpatient Encounter Medications as of 07/31/2021  Medication Sig   Phentermine-Topiramate (QSYMIA) 7.5-46 MG CP24 Take 1 capsule by mouth daily.   Phentermine-Topiramate 3.75-23 MG  CP24 Take 1 capsule by mouth daily.   cloBAZam (ONFI) 10 MG tablet Take by mouth at bedtime. Taking 2.'5mg'$  at bedtime. Slowing titrating to goal of '10mg'$  QHS. (Patient not taking: Reported on 07/31/2021)   diazePAM, 20 MG Dose, (VALTOCO 20 MG DOSE) 2 x 10 MG/0.1ML LQPK Valtoco 20 mg/2 spray ('10mg'$ /0.58m x2) nasal spray  TAKE 1 SPRAY UP TO TWICE DAILY AS NEEDED AS DIRECTED   famciclovir (FAMVIR) 500 MG tablet Take 500 mg by mouth 3 (three) times daily. Pt takes as needed.   hydrOXYzine (ATARAX/VISTARIL) 25 MG tablet Take 25 mg by mouth 3 (three) times daily as needed. Pt takes 1/4th of a pill if needed   propranolol (INDERAL) 20 MG tablet Take 20 mg by mouth 3 (three) times daily.   [DISCONTINUED] propranolol (INDERAL) 10 MG tablet Take 10 mg by mouth 2 (two) times daily.   No facility-administered encounter medications on file as of 07/31/2021.   ALLERGIES: Allergies  Allergen Reactions   Other Itching and Other (See Comments)     Antidepressants-Itching, Gaining Weight     VACCINATION STATUS:  There is no immunization history on file for this patient.  HPI LZsofia Tkachenkois 70y.o. female who presents today with a medical history as above. she is being seen in consultation for weight management requested by FRosita Fire MD.  -History is obtained directly from the patient as well as her chart review.  She reports that she has started gaining weight progressively over the last 3years associated with her decreased exercise routine due to plantar fasciitis and low back pain.  She also stopped her usual dieting regimen.  This is her highest weight ever at 287 pounds.  Her current BMI is 44.98, BMI in October 2020 was 40.4. She is a mother of 2 grown children.  She does not have diabetes, thyroid dysfunction, any history of prolonged exposure to steroids. Admittedly, she has not made any major efforts recently to lose weight.  She has not seen a dietitian, however, she wishes to explore all of her options for weight management.  She denies any history of glaucoma, not on antidepressants nor antiseizure medications. She admits to dietary indiscretions.  She has hypertension on minimal management with amlodipine 5 mg daily.  Review of Systems  Constitutional: + Progressive weight gain, + fatigue, no subjective hyperthermia, no subjective hypothermia Eyes: no blurry vision, no xerophthalmia ENT: no sore throat, no nodules palpated in throat, no dysphagia/odynophagia, no hoarseness Cardiovascular: no Chest Pain, no Shortness of Breath, no palpitations, no leg swelling Respiratory: no cough, no shortness of breath Gastrointestinal: no Nausea/Vomiting/Diarhhea Musculoskeletal: no muscle/joint aches Skin: no rashes Neurological: no tremors, no numbness, no tingling, no dizziness Psychiatric: no depression, no anxiety  Objective:    Vitals with BMI 07/31/2021 06/19/2021 05/09/2021  Height '5\' 7"'$  '5\' 7"'$  '5\' 7"'$   Weight 287  lbs 3 oz 284 lbs 8 oz 285 lbs 14 oz  BMI 44.97 [id number] Systolic [id number] Diastolic 88 83 68  Pulse 70 73 74    BP (!) 141/88   Pulse 70   Ht '5\' 7"'$  (1.702 m)   Wt 287 lb 3.2 oz (130.3 kg)   BMI 44.98 kg/m   Wt Readings from Last 3 Encounters:  07/31/21 287 lb 3.2 oz (130.3 kg)  06/19/21 284 lb 8 oz (129 kg)  05/09/21 285 lb 14.4 oz (129.7 kg)    Physical Exam  Constitutional:  Body mass index is 44.98 kg/m.,  not in acute distress, normal state of mind Eyes: PERRLA, EOMI, no exophthalmos ENT: moist mucous membranes, no gross thyromegaly, no gross cervical lymphadenopathy, no dorsal cervical fat pad, no supraclavicular fullness. Cardiovascular: normal precordial activity, Regular Rate and Rhythm, no Murmur/Rubs/Gallops Respiratory:  adequate breathing efforts, no gross chest deformity, Clear to auscultation bilaterally Gastrointestinal: abdomen soft, Non -tender, No distension, Bowel Sounds present, no gross organomegaly Musculoskeletal: no gross deformities, strength intact in all four extremities Skin: moist, warm, no rashes, + no abnormalities try on abdominal skin. Neurological: no tremor with outstretched hands, Deep tendon reflexes normal in bilateral lower extremities.   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 CERVIX, BIOPSY: - Focal koilocytic atypia (consistent with HPV effect).  B. ENDOCERVIX, CURETTAGE: - Focal koilocytic atypia (consistent with HPV effect).   GROSS DESCRIPTION:  A: The specimen is received in formalin and consists of 3 pieces of tan-white soft tissue, ranging from 0.3 to 0.5 cm.   The specimen is entirely submitted in 1 cassette.  B: The specimen is received in formalin on a wire brush, and consists of a 0.6 x 0.5 x 0.1 cm aggregate of tan soft tissue and mucus.  The specimen is entirely submitted in 1 cassette.  Craig Staggers 05/10/2021)    Final Diagnosis performed by Vicente Males, MD.   Electronically signed 05/11/2021 Technical component performed at [facility]. [facility], [place] [address] [place], [place] [address].  Professional component perfor med at [facility], [place] [address], [place], [place] [address].  Immunohistochemistry Technical component (if applicable) was performed at [facility]. [address] [place], [place], [place] [address].   IMMUNOHISTOCHEMISTRY DISCLAIMER (if applicable): Some of these immunohistochemical stains may have been developed and the performance characteristics determine by [facility]. Some may not have been cleared or approved by the U.S. Food and Drug Administration. The FDA has determined that such clearance or approval is not necessary. This test is used for clinical purposes. It should not be regarded as investigational or for research. This laboratory is certified under the [place] (CLIA-88) as qualified to perform high complexity clinical laboratory testing.  The controls stained appropriately.    Results for KEANNAH, NORTHROP (MRN [id number]) as of 07/31/2021 18:06  Ref. Range 02/06/2021 [id number]  BASIC METABOLIC PANEL Unknown Rpt (A)  COMPREHENSIVE METABOLIC PANEL Unknown Rpt  BUN Latest Ref Range: 4 - 21  22 (A)  Creatinine Latest Ref Range: 0.5 - 1.1  0.8  Calcium Latest Ref Range: 8.7 - 10.7  9.7  Cholesterol Latest Ref Range: 0 - 200  169  HDL Cholesterol Latest Ref Range: 35 - 70  30 (A)  LDL (calc) Unknown 104  Triglycerides Latest Ref Range: 40 - 160  230 (A)  Hemoglobin A1C Unknown 5.4  TSH Latest Ref Range: 0.41 -  5.90  2.49     Assessment & Plan:   1. Morbid obesity ([place])   - Ariyona Kull  is being seen at a kind request of Rosita Fire, MD. - I have reviewed her available endocrine records and clinically  evaluated the patient. - Based on these reviews, she has morbid obesity most likely related to excessive caloric intake.  This is a concerning diagnosis since she started to have metabolic syndrome including low HDL, high triglycerides and high LDL.  She would benefit from weight loss from metabolic point of view.  Options of weight management strategies are discussed in detail with her. I emphasized diet and exercise with her. - she acknowledges that there is a room for improvement in her food and drink choices. - Suggestion is made for her to avoid simple carbohydrates  from her diet including Cakes, Sweet Desserts, Ice Cream, Soda (diet and regular), Sweet Tea, Candies, Chips, Cookies, Store Bought Juices, Alcohol in Excess of  1-2 drinks a day, Artificial Sweeteners,  Coffee Creamer, and "Sugar-free" Products, Lemonade. This will help patient to have more stable blood glucose profile and potentially avoid unintended weight gain.  She mentions the fact that she cannot exercise optimally. Consult will be arranged for her to see a dietitian. This patient will benefit from direct consideration of pharmaceutical agents.  I discussed initiated Qsymia 3.75 mg p.o. daily for 14 days followed by 7.5 mg p.o. daily until next visit in 3 months.  She will have CMP, and a.m. cortisol determined before her next visit. Her last option will be weight loss surgery which is briefly discussed with her. If she is not achieving at least 3% of body weight loss, Qsymia will be discontinued.  - she is advised to maintain close follow up with Rosita Fire, MD for primary care needs.   - Time spent with the patient: 60 minutes, of which >50% was spent in  counseling her about her obesity/associated metabolic  syndrome and the rest in obtaining information about her symptoms, reviewing her previous labs/studies ( including abstractions from other facilities),  evaluations, and treatments,  and developing a plan to confirm diagnosis and long term treatment based on the latest standards of care/guidelines; and documenting her care.  Kathryn Compton participated in the discussions, expressed understanding, and voiced agreement with the above plans.  All questions were answered to her satisfaction. she is encouraged to contact clinic should she have any questions or concerns prior to her return visit.  Follow up plan: Return in about 3 months (around 10/31/2021) for F/U with Pre-visit Labs.   Glade Lloyd, MD [facility] [address] [place],  [address] Phone: [phone]  Fax: [phone]     07/31/2021, 6:06 PM  This note was partially dictated with voice recognition software. Similar sounding words can be transcribed inadequately or may not  be corrected upon review.

## 2021-07-31 NOTE — Patient Instructions (Signed)

## 2021-08-09 ENCOUNTER — Telehealth: Payer: Self-pay | Admitting: "Endocrinology

## 2021-08-09 ENCOUNTER — Other Ambulatory Visit: Payer: Self-pay | Admitting: "Endocrinology

## 2021-08-09 LAB — COMPREHENSIVE METABOLIC PANEL
ALT: 15 IU/L (ref 0–32)
AST: 16 IU/L (ref 0–40)
Albumin/Globulin Ratio: 1.4 (ref 1.2–2.2)
Albumin: 4.1 g/dL (ref 3.8–4.8)
Alkaline Phosphatase: 94 IU/L (ref 44–121)
BUN/Creatinine Ratio: 15 (ref 12–28)
BUN: 12 mg/dL (ref 8–27)
Bilirubin Total: 0.6 mg/dL (ref 0.0–1.2)
CO2: 23 mmol/L (ref 20–29)
Calcium: 9.1 mg/dL (ref 8.7–10.3)
Chloride: 102 mmol/L (ref 96–106)
Creatinine, Ser: 0.79 mg/dL (ref 0.57–1.00)
Globulin, Total: 2.9 g/dL (ref 1.5–4.5)
Glucose: 96 mg/dL (ref 65–99)
Potassium: 4.2 mmol/L (ref 3.5–5.2)
Sodium: 140 mmol/L (ref 134–144)
Total Protein: 7 g/dL (ref 6.0–8.5)
eGFR: 82 mL/min/{1.73_m2} (ref >59)

## 2021-08-09 LAB — CORTISOL-AM, BLOOD: Cortisol - AM: 16.4 ug/dL (ref 6.2–19.4)

## 2021-08-09 NOTE — Telephone Encounter (Signed)
Pt is calling and states she was told to call us if the phentermine was not covered from her insurance, it was not covered.

## 2021-08-09 NOTE — Telephone Encounter (Signed)
Pt made aware, requesting to be seen sooner than October.

## 2021-08-10 NOTE — Telephone Encounter (Signed)
This patient was just seen as a new patient on 8/30. She is requesting to be seen sooner than her 68monthfollow up to discuss her options. When would you like for me to move this too? Also will she still need to do labs before.

## 2021-08-10 NOTE — Telephone Encounter (Signed)
Appt made for Sept 26.

## 2021-08-13 DIAGNOSIS — M47816 Spondylosis without myelopathy or radiculopathy, lumbar region: Secondary | ICD-10-CM | POA: Diagnosis not present

## 2021-08-15 ENCOUNTER — Telehealth: Payer: Self-pay | Admitting: Neurology

## 2021-08-15 NOTE — Telephone Encounter (Signed)
I reviewed 72 hours EEG monitoring from August 26 to 29, report generated by [facility] Dr. Heloise Purpura, [place].   intermittent spells for past 30 years, mild pressure pain starting at the back of her neck, spreading to left occipital region, bilateral shoulder, no loss of consciousness,   there were 5 pushbutton event, does not correlate with abnormal EEG  Occasionally bifrontal temporal and left greater than right temporal sharp transient noted throughout the study, but the event recorded and documented did not correlate any epileptiform or seizure activity,  No focal slowing during awake or sleep study,

## 2021-08-16 DIAGNOSIS — I10 Essential (primary) hypertension: Secondary | ICD-10-CM | POA: Diagnosis not present

## 2021-08-16 DIAGNOSIS — L820 Inflamed seborrheic keratosis: Secondary | ICD-10-CM | POA: Diagnosis not present

## 2021-08-16 DIAGNOSIS — L821 Other seborrheic keratosis: Secondary | ICD-10-CM | POA: Diagnosis not present

## 2021-08-20 NOTE — Telephone Encounter (Signed)
Pt called requesting a call back to go over her EEG results.

## 2021-08-20 NOTE — Addendum Note (Signed)
Addended by: Noberto Retort C on: 08/20/2021 03:32 PM   Modules accepted: Orders

## 2021-08-20 NOTE — Telephone Encounter (Signed)
I spoke to the patient. She verbalized understanding of the results below. She has an appt with cardiology on 08/28/21. She also plans to follow up again with her PCP.

## 2021-08-23 DIAGNOSIS — M47816 Spondylosis without myelopathy or radiculopathy, lumbar region: Secondary | ICD-10-CM | POA: Diagnosis not present

## 2021-08-27 ENCOUNTER — Ambulatory Visit (INDEPENDENT_AMBULATORY_CARE_PROVIDER_SITE_OTHER): Payer: Medicare Other | Admitting: "Endocrinology

## 2021-08-27 ENCOUNTER — Encounter: Payer: Self-pay | Admitting: *Deleted

## 2021-08-27 ENCOUNTER — Encounter: Payer: Self-pay | Admitting: "Endocrinology

## 2021-08-27 NOTE — Patient Instructions (Signed)

## 2021-08-27 NOTE — Progress Notes (Signed)
08/27/2021, 5:42 PM  Endocrinology follow-up note   Subjective:    Patient ID: Kathryn Compton, female    DOB: Aug 12, 1951, PCP Rosita Fire, MD   Past Medical History:  Diagnosis Date   Anxiety    Depression    Hypertension    Motion sickness    Palpitations    Plantar fasciitis    Seizures ([place])    Vertigo    Past Surgical History:  Procedure Laterality Date   BREAST REDUCTION SURGERY     COLONOSCOPY N/A 09/06/2019   Procedure: COLONOSCOPY;  Surgeon: Danie Binder, MD;  [facility];  Service: Endoscopy;  Laterality: N/A;  2:00   POLYPECTOMY  09/06/2019   Procedure: POLYPECTOMY;  Surgeon: Danie Binder, MD;  Location[facility];  Service: Endoscopy;;   REDUCTION MAMMAPLASTY     TUBAL LIGATION     Social History   Socioeconomic History   Marital status: Single    Spouse name: Not on file   Number of children: 2   Years of education: 12   Highest education level: High school graduate  Occupational History   Occupation: Retired  Tobacco Use   Smoking status: Never   Smokeless tobacco: Never  Vaping Use   Vaping Use: Never used  Substance and Sexual Activity   Alcohol use: Not Currently   Drug use: Never   Sexual activity: Yes    Birth control/protection: Surgical    Comment: tubal  Other Topics Concern   Not on file  Social History Narrative   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   Right-handed.   Lives with significant other.   One cup caffeine per day, occasional glass of tea.   Social Determinants of Health   Financial Resource Strain: Not on file  Food Insecurity: Not on file  Transportation Needs: Not on file  Physical Activity: Not on file  Stress: Not on file  Social Connections: Not on file   Family History  Problem Relation Age of Onset   Other Mother        unsure of medical history   Other Father        unknown medical history   Drug abuse  Brother    Suicidality Brother    Colon cancer Neg Hx    Outpatient Encounter Medications as of 08/27/2021  Medication Sig   famciclovir (FAMVIR) 500 MG tablet Take 500 mg by mouth 3 (three) times daily. Pt takes as needed.   hydrOXYzine (ATARAX/VISTARIL) 25 MG tablet Take 25 mg by mouth 3 (three) times daily as needed. Pt takes 1/4th of a pill if needed   propranolol (INDERAL) 20 MG tablet Take 20 mg by mouth 3 (three) times daily.   [DISCONTINUED] diazePAM, 20 MG Dose, (VALTOCO 20 MG DOSE) 2 x 10 MG/0.1ML LQPK Valtoco 20 mg/2 spray (49m/0.1mL x2) nasal spray  TAKE 1 SPRAY UP TO TWICE DAILY AS NEEDED AS DIRECTED   [DISCONTINUED] Phentermine-Topiramate (QSYMIA) 7.5-46 MG CP24 Take 1 capsule by mouth daily.   [DISCONTINUED] Phentermine-Topiramate 3.75-23 MG CP24 Take 1 capsule by mouth daily.   No facility-administered encounter medications on file as of 08/27/2021.   ALLERGIES: Allergies  Allergen Reactions   Other  Itching and Other (See Comments)    Antidepressants-Itching, Gaining Weight     VACCINATION STATUS:  There is no immunization history on file for this patient.  HPI Zyliah Schier is 70 y.o. female who presents today with her new labs for follow-up.  Her labs are not showing any endocrine deficits.  She was seen in consultation for weight management requested by  Rosita Fire, MD.   See notes from prior visits. -History is obtained directly from the patient as well as her chart review.  She reports that she has started gaining weight progressively over the last 3years associated with her decreased exercise routine due to plantar fasciitis and low back pain.  She also stopped her usual dieting regimen.  This is her highest weight ever at 287 pounds.  Her current BMI is 44.98, BMI in October 2020 was 40.4. She is a mother of 2 grown children.  She does not have diabetes, thyroid dysfunction, any history of prolonged exposure to steroids.  Since her last visit, she made  some attempt to lose weight, with loss of 3 pounds.  Her insurance did not provide coverage for Qsymia.  She wants to explore her next option in weight management. She denies any history of glaucoma, not on antidepressants nor antiseizure medications. She admits to dietary indiscretions.  She has hypertension on minimal management with amlodipine 5 mg daily.  Review of Systems  Constitutional: + Progressive weight gain, + fatigue, no subjective hyperthermia, no subjective hypothermia Eyes: no blurry vision, no xerophthalmia ENT: no sore throat, no nodules palpated in throat, no dysphagia/odynophagia, no hoarseness Cardiovascular: no Chest Pain, no Shortness of Breath, no palpitations, no leg swelling Respiratory: no cough, no shortness of breath Gastrointestinal: no Nausea/Vomiting/Diarhhea Musculoskeletal: no muscle/joint aches Skin: no rashes Neurological: no tremors, no numbness, no tingling, no dizziness Psychiatric: no depression, no anxiety  Objective:    Vitals with BMI 08/27/2021 07/31/2021 06/19/2021  Height _0  _1  _2   Weight 284 lbs 287 lbs 3 oz 284 lbs 8 oz  BMI 44.47 37.16 96.78  Systolic 938 101 751  Diastolic 76 88 83  Pulse 64 70 73    BP 124/76   Pulse 64   Ht _3  (1.702 m)   Wt 284 lb (128.8 kg)   BMI 44.48 kg/m   Wt Readings from Last 3 Encounters:  08/27/21 284 lb (128.8 kg)  07/31/21 287 lb 3.2 oz (130.3 kg)  06/19/21 284 lb 8 oz (129 kg)    Physical Exam  Constitutional:  Body mass index is 44.48 kg/m.,  not in acute distress, normal state of mind Eyes: PERRLA, EOMI, no exophthalmos ENT: moist mucous membranes, no gross thyromegaly, no gross cervical lymphadenopathy, no dorsal cervical fat pad, no supraclavicular fullness. Cardiovascular: normal precordial activity, Regular Rate and Rhythm, no Murmur/Rubs/Gallops Respiratory:  adequate breathing efforts, no gross chest deformity, Clear to auscultation bilaterally Gastrointestinal: abdomen  soft, Non -tender, No distension, Bowel Sounds present, no gross organomegaly Musculoskeletal: no gross deformities, strength intact in all four extremities Skin: moist, warm, no rashes, + no abnormalities try on abdominal skin. Neurological: no tremor with outstretched hands, Deep tendon reflexes normal in bilateral lower extremities.   Recent Results (from the past 2160 hour(s))  Comprehensive metabolic panel     Status: None   Collection Time: 08/07/21  9:52 AM  Result Value Ref Range   Glucose 96 65 - 99 mg/dL   BUN 12 8 - 27 mg/dL   Creatinine, Ser 0.79  0.57 - 1.00 mg/dL   eGFR 82 >59 mL/min/1.73   BUN/Creatinine Ratio 15 12 - 28   Sodium 140 134 - 144 mmol/L   Potassium 4.2 3.5 - 5.2 mmol/L   Chloride 102 96 - 106 mmol/L   CO2 23 20 - 29 mmol/L   Calcium 9.1 8.7 - 10.3 mg/dL   Total Protein 7.0 6.0 - 8.5 g/dL   Albumin 4.1 3.8 - 4.8 g/dL   Globulin, Total 2.9 1.5 - 4.5 g/dL   Albumin/Globulin Ratio 1.4 1.2 - 2.2   Bilirubin Total 0.6 0.0 - 1.2 mg/dL   Alkaline Phosphatase 94 44 - 121 IU/L   AST 16 0 - 40 IU/L   ALT 15 0 - 32 IU/L  Cortisol-am, blood     Status: None   Collection Time: 08/07/21  9:52 AM  Result Value Ref Range   Cortisol - AM 16.4 6.2 - 19.4 ug/dL   Results for ESTRELLA, ALCARAZ (MRN [id number]) as of 07/31/2021 18:06  Ref. Range 02/06/2021 23:53  BASIC METABOLIC PANEL Unknown Rpt (A)  COMPREHENSIVE METABOLIC PANEL Unknown Rpt  BUN Latest Ref Range: 4 - 21  22 (A)  Creatinine Latest Ref Range: 0.5 - 1.1  0.8  Calcium Latest Ref Range: 8.7 - 10.7  9.7  Cholesterol Latest Ref Range: 0 - 200  169  HDL Cholesterol Latest Ref Range: 35 - 70  30 (A)  LDL (calc) Unknown 104  Triglycerides Latest Ref Range: 40 - 160  230 (A)  Hemoglobin A1C Unknown 5.4  TSH Latest Ref Range: 0.41 - 5.90  2.49      Assessment & Plan:   1. Morbid obesity ([place])  -I reviewed her recent labs which did not show steroid excess.  She was found to have normal thyroid function  during her last visit. - she has morbid obesity most likely related to excessive caloric intake.  This is a concerning diagnosis since she started to have metabolic syndrome including low HDL, high triglycerides and high LDL.  She would benefit from weight loss from metabolic point of view.  Options of weight management strategies have been discussed in detail with her.  She is making some attempt and lifestyle modification.  Her insurance did not provide coverage for Qsymia.  She would benefit from bariatric surgery.  I gave her information brochure for [employer].  Did program for her to call initiate seminars.  -In the meantime, she is advised to continue her modified diet program.  - she acknowledges that there is a room for improvement in her food and drink choices. - Suggestion is made for her to avoid simple carbohydrates  from her diet including Cakes, Sweet Desserts, Ice Cream, Soda (diet and regular), Sweet Tea, Candies, Chips, Cookies, Store Bought Juices, Alcohol in Excess of  1-2 drinks a day, Artificial Sweeteners,  Coffee Creamer, and "Sugar-free" Products, Lemonade. This will help patient to have more stable blood glucose profile and potentially avoid unintended weight gain.   She mentions the fact that she cannot exercise optimally. Consult has been arranged for her to see dietitian.     - she is advised to maintain close follow up with Rosita Fire, MD for primary care needs.   I spent 30 minutes in the care of the patient today including review of labs from Thyroid Function, CMP, and other relevant labs ; imaging/biopsy records (current and previous including abstractions from other facilities); face-to-face time discussing  her lab results and symptoms, medications doses, her options  of short and long term treatment based on the latest standards of care / guidelines;   and documenting the encounter.  Kathryn Compton  participated in the discussions, expressed  understanding, and voiced agreement with the above plans.  All questions were answered to her satisfaction. she is encouraged to contact clinic should she have any questions or concerns prior to her return visit.   Follow up plan: Return in about 6 months (around 02/24/2022) for F/U with no Labs.   Glade Lloyd, MD [facility] [address] [place], [place] [address] Phone: [phone]  Fax: [phone]     08/27/2021, 5:42 PM  This note was partially dictated with voice recognition software. Similar sounding words can be transcribed inadequately or may not  be corrected upon review.

## 2021-08-28 ENCOUNTER — Encounter: Payer: Self-pay | Admitting: Cardiology

## 2021-08-28 ENCOUNTER — Ambulatory Visit (INDEPENDENT_AMBULATORY_CARE_PROVIDER_SITE_OTHER): Payer: Medicare Other | Admitting: Cardiology

## 2021-08-28 VITALS — BP 142/70 | HR 65 | Ht 67.0 in | Wt 284.0 lb

## 2021-08-28 DIAGNOSIS — R55 Syncope and collapse: Secondary | ICD-10-CM | POA: Diagnosis not present

## 2021-08-28 DIAGNOSIS — R002 Palpitations: Secondary | ICD-10-CM

## 2021-08-28 NOTE — Addendum Note (Signed)
Addended by: Laurine Blazer on: 08/28/2021 02:18 PM   Modules accepted: Orders

## 2021-08-28 NOTE — Progress Notes (Signed)
Clinical Summary Ms. Jefferson Davis is a 70 y.o.female seen as new consult, referred by NP Florene Glen for palpitations   Palpitations - reports symptoms since her 71s - recently increase in symptoms, though improved on propranolol - mild symptoms at times on propranolol - reports during prior medical testing, was told heart rhythm was variable - 1 cup of coffee a day. No EtoH    2. Syncope -episode of syncope in Nov 2020 - saw neurology, extensive workup  - occurred while in the bathroom whle getting ready to clean. No prodrome, fell down toward bathtub though was able to partial cath herself. Very brief LOC.  - no recurrence   3. Headaches - in general gets episodes of feeling in neck into left sided of head, change in hearing. Lingers for few hours after. Can at times have some associated palpitations.  - extensive neuro workup previously  Past Medical History:  Diagnosis Date   Anxiety    Depression    Hypertension    Motion sickness    Palpitations    Plantar fasciitis    Seizures ([facility])    Vertigo      Allergies  Allergen Reactions   Other Itching and Other (See Comments)    Antidepressants-Itching, Gaining Weight      Current Outpatient Medications  Medication Sig Dispense Refill   famciclovir (FAMVIR) 500 MG tablet Take 500 mg by mouth 3 (three) times daily. Pt takes as needed.     hydrOXYzine (ATARAX/VISTARIL) 25 MG tablet Take 25 mg by mouth 3 (three) times daily as needed. Pt takes 1/4th of a pill if needed     propranolol (INDERAL) 20 MG tablet Take 20 mg by mouth 3 (three) times daily.     No current facility-administered medications for this visit.     Past Surgical History:  Procedure Laterality Date   BREAST REDUCTION SURGERY     COLONOSCOPY N/A 09/06/2019   Procedure: COLONOSCOPY;  Surgeon: Danie Binder, MD;  [facility];  Service: Endoscopy;  Laterality: N/A;  2:00   POLYPECTOMY  09/06/2019   Procedure: POLYPECTOMY;  Surgeon:  Danie Binder, MD;  [facility];  Service: Endoscopy;;   REDUCTION MAMMAPLASTY     TUBAL LIGATION       Allergies  Allergen Reactions   Other Itching and Other (See Comments)    Antidepressants-Itching, Gaining Weight       Family History  Problem Relation Age of Onset   Other Mother        unsure of medical history   Other Father        unknown medical history   Drug abuse Brother    Suicidality Brother    Colon cancer Neg Hx      Social History Ms. [place] reports that she has never smoked. She has never used smokeless tobacco. Ms. [place] reports that she does not currently use alcohol.   Review of Systems CONSTITUTIONAL: No weight loss, fever, chills, weakness or fatigue.  HEENT: Eyes: No visual loss, blurred vision, double vision or yellow sclerae.No hearing loss, sneezing, congestion, runny nose or sore throat.  SKIN: No rash or itching.  CARDIOVASCULAR: per hpi RESPIRATORY: No shortness of breath, cough or sputum.  GASTROINTESTINAL: No anorexia, nausea, vomiting or diarrhea. No abdominal pain or blood.  GENITOURINARY: No burning on urination, no polyuria NEUROLOGICAL: per hpi MUSCULOSKELETAL: No muscle, back pain, joint pain or stiffness.  LYMPHATICS: No enlarged nodes. No history of splenectomy.  PSYCHIATRIC: No history of depression or anxiety.  ENDOCRINOLOGIC: No reports of sweating, cold or heat intolerance. No polyuria or polydipsia.  Marland Kitchen   Physical Examination Today's Vitals   08/28/21 1327  BP: (!) 142/70  Pulse: 65  SpO2: 96%  Weight: 284 lb (128.8 kg)  Height: 5\' 7"  (1.702 m)   Body mass index is 44.48 kg/m.  Gen: resting comfortably, no acute distress HEENT: no scleral icterus, pupils equal round and reactive, no palptable cervical adenopathy,  CV: RRR, no m/r/g no jvd Resp: Clear to auscultation bilaterally GI: abdomen is soft, non-tender, non-distended, normal bowel sounds, no hepatosplenomegaly MSK: extremities are  warm, no edema.  Skin: warm, no rash Neuro:  no focal deficits Psych: appropriate affect     Assessment and Plan  Palpitations - will obtain 30 day event monitor for palpitations  2. Syncope - isolated episode nearly 2 years ago. No specific prodrome. Without recurrence would this cardiogenic syncope unlikely - f/u monitor results  3. Headaches - unclear episodes of pain starting in neck spreading into head with some auditory changes. Extensive neuro workup. Not typical for anything cardiac though can have some palpitatoins at times with it - f/u monitor results   F/u 6 weeks.       Arnoldo Lenis, M.D.

## 2021-08-28 NOTE — Patient Instructions (Addendum)
Medication Instructions:  Continue all current medications.  Labwork: none  Testing/Procedures: Your physician has recommended that you wear a 30 day event monitor. Event monitors are medical devices that record the heart's electrical activity. Doctors most often [place] these monitors to diagnose arrhythmias. Arrhythmias are problems with the speed or rhythm of the heartbeat. The monitor is a small, portable device. You can wear one while you do your normal daily activities. This is usually used to diagnose what is causing palpitations/syncope (passing out).  Office will contact with results via phone or letter.    Follow-Up: 6 weeks    Any Other Special Instructions Will Be Listed Below (If Applicable).   If you need a refill on your cardiac medications before your next appointment, please call your pharmacy.

## 2021-09-03 DIAGNOSIS — Z23 Encounter for immunization: Secondary | ICD-10-CM | POA: Diagnosis not present

## 2021-09-03 DIAGNOSIS — I10 Essential (primary) hypertension: Secondary | ICD-10-CM | POA: Diagnosis not present

## 2021-09-03 DIAGNOSIS — R002 Palpitations: Secondary | ICD-10-CM | POA: Diagnosis not present

## 2021-09-04 ENCOUNTER — Ambulatory Visit (INDEPENDENT_AMBULATORY_CARE_PROVIDER_SITE_OTHER): Payer: Medicare Other

## 2021-09-11 ENCOUNTER — Other Ambulatory Visit: Payer: Self-pay

## 2021-09-11 ENCOUNTER — Encounter: Payer: Self-pay | Admitting: Nutrition

## 2021-09-11 ENCOUNTER — Encounter: Payer: Medicare Other | Attending: Internal Medicine | Admitting: Nutrition

## 2021-09-11 DIAGNOSIS — E559 Vitamin D deficiency, unspecified: Secondary | ICD-10-CM | POA: Diagnosis not present

## 2021-09-11 NOTE — Patient Instructions (Signed)
Goals  Follow My Plate Drink 3 bottles of water per day Eat three meals a day at the times discussed. Increase protein with all meals and make sure you have some fruit also. Take medications as prescribed. Walk 5 minutes twice a day. Lose 1 lb per week

## 2021-09-11 NOTE — Progress Notes (Signed)
Medical Nutrition Therapy  Appointment Start time:  1300   Appointment End time:  1400  Primary concerns today: Obesity  Referral diagnosis: E66.9 Preferred learning style: no Preference  Learning readiness: Contemplating  NUTRITION ASSESSMENT  Gained weight in the last 5 years. She had plantar fasciatic, had back issues and had ablation to her bac and been a lot less active. Seeing a cardiologist. Has a lot of pain in back on her neck that goes up to her head. Seen a neurologist and was told she doesn't have epileptic seizures. She just started being treated for depression. PCP Dr. Legrand Rams. UBW 5 years ago- can't remember but she said she has gained a lot of weight in the last 5 years. History of dieting most of her life: had done low carb diet, low fat, cutting back and calorie restriction. Gained all and more weight back on after diets. Only eats 1-2 meals per day. Admits to not drinking much water. Doesn't eat out and cooks her food at home. Looking to do bariatric weight loss surgery in [place].  Current diet is insuffient to meet her needs for sustained weight loss. Diet is insuffient in protein, CHO and calories with limiting to eating only 2 meals per day. Doesn't like to go to bed with a full stomach.  Sees Dr. Dorris Fetch, Endocrinology. No endocrine problems identified.  Anthropometrics  Wt Readings from Last 3 Encounters:  08/28/21 284 lb (128.8 kg)  08/27/21 284 lb (128.8 kg)  07/31/21 287 lb 3.2 oz (130.3 kg)   Ht Readings from Last 3 Encounters:  08/28/21 5\' 7"  (1.702 m)  08/27/21 5\' 7"  (1.702 m)  07/31/21 5\' 7"  (1.702 m)   There is no height or weight on file to calculate BMI. @BMIFA @ Facility age limit for growth percentiles is 20 years. Facility age limit for growth percentiles is 20 years.    Clinical Medical Hx: Obesity, Depression, Vit D deficiency Medications: See list Labs:  Lab Results  Component Value Date   HGBA1C 5.4 02/06/2021   CMP Latest Ref Rng & Units  08/07/2021 02/06/2021  Glucose 65 - 99 mg/dL 96 -  BUN 8 - 27 mg/dL 12 22(A)  Creatinine 0.57 - 1.00 mg/dL 0.79 0.8  Sodium 134 - 144 mmol/L 140 -  Potassium 3.5 - 5.2 mmol/L 4.2 -  Chloride 96 - 106 mmol/L 102 -  CO2 20 - 29 mmol/L 23 -  Calcium 8.7 - 10.3 mg/dL 9.1 9.7  Total Protein 6.0 - 8.5 g/dL 7.0 -  Total Bilirubin 0.0 - 1.2 mg/dL 0.6 -  Alkaline Phos 44 - 121 IU/L 94 -  AST 0 - 40 IU/L 16 -  ALT 0 - 32 IU/L 15 -   Lipid Panel     Component Value Date/Time   CHOL 169 02/06/2021 0000   TRIG 230 (A) 02/06/2021 0000   HDL 30 (A) 02/06/2021 0000   LDLCALC 104 02/06/2021 0000    Notable Signs/Symptoms: Tired, fatigue, lack of energy.  Lifestyle & Dietary Hx Has a roommate. She cooks for herself.  Eats 2 meals a day; skips lunch.   Estimated daily fluid intake: 24 oz Supplements: none Sleep: 5-7 Stress / self-care: none Current average weekly physical activity:  does some working out at home  24-Hr Dietary Recall First Meal:  9 am English muffin, butter, coffee-cream, sugar and sf sweeter, caffeeine pill. Snack:  4 pm Zucchini stew 2 cup( zucchin, onion, garlic, potato, tomato, yellow squash, water Snack:  Third Meal:  Snack:  water  Beverages: water  Estimated Energy Needs Calories: 1200 Carbohydrate: 135g Protein: 90g Fat: 33g   NUTRITION DIAGNOSIS  NB-1.1 Food and nutrition-related knowledge deficit As related to Lifestyle dieting contributing to obesity.  As evidenced by BMI 41 and current diet recall.   NUTRITION INTERVENTION  Nutrition education (E-1) on the following topics:  Nutrition and  Pre-Diabetes/weight loss education provided on My Plate, CHO counting, meal planning, portion sizes, timing of meals,  benefits of exercising 30 minutes per day and prevention of DM and other comorbidity issues. Weight loss tips, food journal, nutrient dense foods vs empty calorie foods Energy needs for weight loss and prevention of being in a starvation mode  preventing weight loss. Plant based lifestyle/meal plans  Handouts Provided Include  My Plate Meal Plan Card Weight lost tips Plant based lifestyle and meal plan  Learning Style & Readiness for Change Teaching method utilized: Visual & Auditory  Demonstrated degree of understanding via: Teach Back  Barriers to learning/adherence to lifestyle change: none  Goals Established by Pt Goals  Follow My Plate Drink 3 bottles of water per day Eat three meals a day at the times discussed. Increase protein with all meals and make sure you have some fruit also. Take medications as prescribed. Walk 5 minutes twice a day. Lose 1 lb per week   MONITORING & EVALUATION Dietary intake, weekly physical activity, and weight in 1 month.  Next Steps  Patient is to work on eating three meals per day and increasing exercise.Marland Kitchen

## 2021-09-17 DIAGNOSIS — M533 Sacrococcygeal disorders, not elsewhere classified: Secondary | ICD-10-CM | POA: Diagnosis not present

## 2021-09-27 DIAGNOSIS — L820 Inflamed seborrheic keratosis: Secondary | ICD-10-CM | POA: Diagnosis not present

## 2021-09-27 NOTE — BH Specialist Note (Signed)
Integrated Behavioral Health via Telemedicine Visit  09/27/2021 Kathryn Compton Compton [id number]  Number of [place] visits: 1 Session Start time: 10:19  Session End time: 11:13 Total time:  25  Referring Provider: Darron Doom, MD Patient/Family location: Home [facility] Provider location: [facility] [place] [facility]  All persons participating in visit: [name] Compton and [place]   Types of Service: Individual psychotherapy and Telephone visit  I connected with Kathryn Compton Compton and/or Kathryn Compton Compton's  n/a  via  Telephone or Video Enabled Telemedicine Application  (Video is Caregility application) and verified that I am speaking with the correct person using two identifiers. Discussed confidentiality: Yes   I discussed the limitations of telemedicine and the availability of in person appointments.  Discussed there is a possibility of technology failure and discussed alternative modes of communication if that failure occurs.  I discussed that engaging in this telemedicine visit, they consent to the provision of behavioral healthcare and the services will be billed under their insurance.  Patient and/or legal guardian expressed understanding and consented to Telemedicine visit: Yes   Presenting Concerns: Patient and/or family reports the following symptoms/concerns: Increased anxiety, depression, stress, worry, attributed to unknowns with current health concerns; pt taking Vistaril low-dose to help with sleep about 3 times/week, as needed. Pt open to implementing self-coping strategy to manage worry.  Duration of problem: Increase with current health; hx in past depression/anxiety; Severity of problem:  moderately severe  Patient and/or Family's Strengths/Protective Factors: Concrete supports in place (healthy food, safe environments, etc.) and Sense of purpose  Goals Addressed: Patient will:  Reduce symptoms of:  anxiety, depression, and stress   Increase knowledge and/or ability of: self-management skills   Demonstrate ability to: Increase healthy adjustment to current life circumstances  Progress towards Goals: Ongoing  Interventions: Interventions utilized:  ACT (Acceptance and Commitment Therapy) Standardized Assessments completed: GAD-7 and PHQ 9  Patient and/or Family Response: Pt agrees with treatment plan  Assessment: Patient currently experiencing Adjustment disorder with mixed anxiety and depressed mood.   Patient may benefit from psychoeducation and brief therapeutic interventions regarding coping with symptoms of anxiety, depression .  Plan: Follow up with behavioral health clinician on : Two weeks; Call Danyel Tobey at [phone], as needed. Behavioral recommendations:  -Begin Worry Time strategy; practice daily for two weeks, as discussed -Continue plan to attend upcoming medical appointments Referral(s): [place] (In Clinic)  I discussed the assessment and treatment plan with the patient and/or parent/guardian. They were provided an opportunity to ask questions and all were answered. They agreed with the plan and demonstrated an understanding of the instructions.   They were advised to call back or seek an in-person evaluation if the symptoms worsen or if the condition fails to improve as anticipated.  Garlan Fair, LCSW  Depression screen [facility] 2/9 09/11/2021 05/09/2021 08/01/2020  Decreased Interest 2 0 1  Down, Depressed, Hopeless 2 1 1   PHQ - 2 Score 4 1 2   Altered sleeping 3 2 3   Tired, decreased energy 3 2 3   Change in appetite 2 0 1  Feeling bad or failure about yourself  3 2 3   Trouble concentrating 0 2 0  Moving slowly or fidgety/restless 0 1 0  Suicidal thoughts - 0 1  PHQ-9 Score 15 10 13   Difficult doing work/chores - Not difficult at all Not difficult at all   GAD 7 : Generalized Anxiety Score 05/09/2021 08/01/2020  Nervous,  Anxious, on  Edge 2 3  Control/stop worrying 2 1  Worry too much - different things 3 1  Trouble relaxing 2 1  Restless 2 1  Easily annoyed or irritable 3 0  Afraid - awful might happen 2 0  Total GAD 7 Score 16 7  Anxiety Difficulty - Somewhat difficult

## 2021-09-28 ENCOUNTER — Ambulatory Visit: Payer: Medicare Other | Admitting: Clinical

## 2021-09-28 DIAGNOSIS — F4323 Adjustment disorder with mixed anxiety and depressed mood: Secondary | ICD-10-CM

## 2021-09-28 NOTE — Patient Instructions (Signed)
[facility] [address] [place], [place] [address] [phone] (main office) [phone] (Jontavia Leatherbury's office)   

## 2021-10-01 NOTE — BH Specialist Note (Signed)
Integrated Behavioral Health via Telemedicine Visit  10/01/2021 Kathryn Compton [id number]  Number of [place] visits: 2 Session Start time: 10:58  Session End time: 11:49 Total time:  29  Referring Provider: Darron Doom, MD Patient/Family location: Home [facility] Provider location: [facility] [place] [facility]  All persons participating in visit: [name] and [place]   Types of Service: Individual psychotherapy and Telephone visit  I connected with Kathryn Compton and/or Kathryn Compton's  n/a  via  Telephone or Video Enabled Telemedicine Application  (Video is Caregility application) and verified that I am speaking with the correct person using two identifiers. Discussed confidentiality: Yes   I discussed the limitations of telemedicine and the availability of in person appointments.  Discussed there is a possibility of technology failure and discussed alternative modes of communication if that failure occurs.  I discussed that engaging in this telemedicine visit, they consent to the provision of behavioral healthcare and the services will be billed under their insurance.  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  Duration of problem: Ongoing; Severity of problem:  moderately severe  Patient and/or Family's Strengths/Protective Factors: Concrete supports in place (healthy food, safe environments, etc.) and Sense of purpose  Goals Addressed: Patient will:  Reduce symptoms of: anxiety, depression, and stress   Progress towards  Goals: Ongoing  Interventions: Interventions utilized:  ACT (Acceptance and Commitment Therapy) Standardized Assessments completed: Not Needed  Patient and/or Family Response: Pt agrees with treatment plan  Assessment: Patient currently experiencing Anxiety disorder, unspecified.   Patient may benefit from continued psychoeducation and brief therapeutic interventions regarding coping with symptoms of anxiety, depression, stress.  Plan: Follow up with behavioral health clinician on : Two weeks Behavioral recommendations:  -Continue taking Vistaril as prescribed -Continue plan to publish book; continue working on writing screenplay from book -Continue setting healthy boundaries with non-supportive family members Referral(s): [place] (In Clinic)  I discussed the assessment and treatment plan with the patient and/or parent/guardian. They were provided an opportunity to ask questions and all were answered. They agreed with the plan and demonstrated an understanding of the instructions.   They were advised to call back or seek an in-person evaluation if the symptoms worsen or if the condition fails to improve as anticipated.  Caroleen Hamman [name], LCSW

## 2021-10-04 DIAGNOSIS — R002 Palpitations: Secondary | ICD-10-CM | POA: Diagnosis not present

## 2021-10-04 DIAGNOSIS — I10 Essential (primary) hypertension: Secondary | ICD-10-CM | POA: Diagnosis not present

## 2021-10-08 NOTE — Progress Notes (Signed)
Cardiology Office Note  Date: 10/09/2021   ID: Kathryn Compton, DOB February 22, 1951, MRN [id number]  PCP:  Rosita Fire, MD  Cardiologist:  None Electrophysiologist:  None   Chief Complaint: 6-week follow-up  History of Present Illness: Kathryn Compton is a 70 y.o. female with a history of hypertension, palpitations, vertigo, seizure, OSA.  She was last seen by Dr. Harl Bowie on 08/08/2021 for palpitations and syncopal episode.  She had a history of palpitations going back to her 45s.  She was having recent increase in symptoms.  Improved on propranolol.  Mild symptoms at times on propranolol..  History of syncopal episode November 2020.  She had seen neurology in the past and had extensive work up.  No recurrence.  History of headaches episodes of feeling in the neck and the left side of head.  Change in hearing.  Lingering for a few hours.  Previous extensive neuro work-up.  Plan was to obtain 30-day event monitor for palpitations.  Previous syncope was isolated episode 2 years prior.  No recurrences.  Likely not cardiogenic.  Headache was not typical for anything cardiac could have some associated palpitations at times.  She is here for follow-up for recent cardiac monitor which showed a fair amount of PACs and PVCs episodes of bigeminy.  1 brief run of V. tach lasting about 4 beats.  She states she increased her propranolol to 40 mg p.o. 3 times daily which seems to have calmed down the palpitations and abnormal beats.  We discussed keeping the dosage at this level for now.  She is unsure if she has ever had any thyroid studies to check and see if she has any hyper or hypothyroidism.  We discussed a carotid study which showed evidence of elevated peak systolic velocities approaching 50% luminal narrowing range.  Report mentioned further evaluation with CTA could be performed as clinically indicated.  She states sometimes when she hyperextends her neck a little she becomes faint and feeling as  though she may pass out having unusual feelings in her head and neck.  We discussed the fact that since she is having frequent PVCs this could be from an ischemic etiology and may need to order a stress test.   Past Medical History:  Diagnosis Date   Anxiety    Depression    Hypertension    Motion sickness    Palpitations    Plantar fasciitis    Seizures ([place])    Vertigo     Past Surgical History:  Procedure Laterality Date   BREAST REDUCTION SURGERY     COLONOSCOPY N/A 09/06/2019   Procedure: COLONOSCOPY;  Surgeon: Danie Binder, MD;  [facility];  Service: Endoscopy;  Laterality: N/A;  2:00   POLYPECTOMY  09/06/2019   Procedure: POLYPECTOMY;  Surgeon: Danie Binder, MD;  Location: [facility];  Service: Endoscopy;;   REDUCTION MAMMAPLASTY     TUBAL LIGATION      Current Outpatient Medications  Medication Sig Dispense Refill   citalopram (CELEXA) 20 MG tablet Take 20 mg by mouth daily.     famciclovir (FAMVIR) 500 MG tablet Take 500 mg by mouth 3 (three) times daily. Pt takes as needed.     hydrOXYzine (ATARAX/VISTARIL) 25 MG tablet Take 25 mg by mouth 3 (three) times daily as needed. Pt takes 1/4th of a pill if needed     propranolol (INDERAL) 20 MG tablet Take 20 mg by mouth 3 (three) times daily.     No  current facility-administered medications for this visit.   Allergies:  Other   Social History: The patient  reports that she has never smoked. She has never used smokeless tobacco. She reports that she does not currently use alcohol. She reports that she does not use drugs.   Family History: The patient's family history includes Drug abuse in her brother; Other in her father and mother; Suicidality in her brother.   ROS:  Please see the history of present illness. Otherwise, complete review of systems is positive for none.  All other systems are reviewed and negative.   Physical Exam: VS:  BP 118/78   Pulse 68   Ht 5\' 7"  (1.702 m)   Wt 283 lb (128.4  kg)   SpO2 94%   BMI 44.32 kg/m , BMI Body mass index is 44.32 kg/m.  Wt Readings from Last 3 Encounters:  10/09/21 283 lb (128.4 kg)  09/11/21 263 lb (119.3 kg)  08/28/21 284 lb (128.8 kg)    General: Patient appears comfortable at rest. Neck: Supple, no elevated JVP or carotid bruits, no thyromegaly. Lungs: Clear to auscultation, nonlabored breathing at rest. Cardiac: Regular rate and rhythm, no S3 or significant systolic murmur, no pericardial rub. Extremities: No pitting edema, distal pulses 2+. Skin: Warm and dry. Musculoskeletal: No kyphosis. Neuropsychiatric: Alert and oriented x3, affect grossly appropriate.  ECG:   We reviewed the results of her Recent Labwork: 02/06/2021: TSH 2.49 08/07/2021: ALT 15; AST 16; BUN 12; Creatinine, Ser 0.79; Potassium 4.2; Sodium 140     Component Value Date/Time   CHOL 169 02/06/2021 0000   TRIG 230 (A) 02/06/2021 0000   HDL 30 (A) 02/06/2021 0000   LDLCALC 104 02/06/2021 0000    Other Studies Reviewed Today:  July 19, 2021 carotid artery duplex IMPRESSION: 1. Minimal amount of left-sided atherosclerotic plaque results in elevated peak systolic velocities which approach the 50% luminal narrowing range. Further evaluation with CTA could be performed as clinically indicated. 2. Minimal amount of right-sided intimal thickening/atherosclerotic plaque, not resulting in a hemodynamically significant stenosis.    Assessment and Plan:  1. Palpitations   2. Syncope and collapse    1. Palpitations We reviewed the results of her event monitor which showed quite a bit of PACs and bigeminal episodes.  1 short episode of NSVT.  We mention the frequency of PVCs could be of an ischemic etiology and she might benefit from a nuclear stress test.  Also mention doing thyroid lab work to check for thyroid dysfunction.  Please get a Lexiscan nuclear stress test and thyroid panel including TSH, T3, T4.  Increase propranolol to 40 mg p.o. 3 times  daily.  Patient states she recently increased her 20 mg 3 times daily dosages to 40 and that seems to have calm down her palpitations and feelings of dizziness/presyncope sensation.  2. Syncope and collapse Recently had carotid artery study which showed elevated peak velocities in the left side with atherotic plaque with velocities approaching this 50% luminal narrowing.  Report mentioned further evaluation with CTA could be performed as clinically indicated.  She states she is having episodes when hyperextending her neck she feels dizzy and near syncopal.  Please get a CTA of head and neck.  Medication Adjustments/Labs and Tests Ordered: Current medicines are reviewed at length with the patient today.  Concerns regarding medicines are outlined above.   Disposition: Follow-up with Dr. Harl Bowie or APP 3 months  Signed, Levell July, NP 10/09/2021 11:42 AM    [facility] at [place], [place], [place] [address] Phone: [phone]; Fax: [phone]

## 2021-10-09 ENCOUNTER — Ambulatory Visit (INDEPENDENT_AMBULATORY_CARE_PROVIDER_SITE_OTHER): Payer: Medicare Other | Admitting: Family Medicine

## 2021-10-09 ENCOUNTER — Encounter: Payer: Self-pay | Admitting: *Deleted

## 2021-10-09 ENCOUNTER — Other Ambulatory Visit: Payer: Self-pay | Admitting: Family Medicine

## 2021-10-09 ENCOUNTER — Encounter: Payer: Self-pay | Admitting: Family Medicine

## 2021-10-09 VITALS — BP 118/78 | HR 68 | Ht 67.0 in | Wt 283.0 lb

## 2021-10-09 DIAGNOSIS — R55 Syncope and collapse: Secondary | ICD-10-CM

## 2021-10-09 DIAGNOSIS — R42 Dizziness and giddiness: Secondary | ICD-10-CM

## 2021-10-09 DIAGNOSIS — I493 Ventricular premature depolarization: Secondary | ICD-10-CM

## 2021-10-09 DIAGNOSIS — R002 Palpitations: Secondary | ICD-10-CM | POA: Diagnosis not present

## 2021-10-09 MED ORDER — PROPRANOLOL HCL 40 MG PO TABS: 40.0000 mg | ORAL_TABLET | Freq: Three times a day (TID) | ORAL | 6 refills | Status: DC

## 2021-10-09 NOTE — Patient Instructions (Addendum)
Medication Instructions:  Increase Propranolol to 40mg  three times per day  Continue all other medications.     Labwork: TSH, FREE T3 & T4 - orders given today  Testing/Procedures: Your physician has requested that you have a lexiscan myoview. For further information please visit [email or web address]. Please follow instruction sheet, as given. CTA of head & neck   Follow-Up: Office will contact with results via phone or letter.   3 months   Any Other Special Instructions Will Be Listed Below (If Applicable).   If you need a refill on your cardiac medications before your next appointment, please call your pharmacy.

## 2021-10-11 ENCOUNTER — Other Ambulatory Visit: Payer: Self-pay | Admitting: Cardiology

## 2021-10-11 DIAGNOSIS — R002 Palpitations: Secondary | ICD-10-CM | POA: Diagnosis not present

## 2021-10-11 DIAGNOSIS — R55 Syncope and collapse: Secondary | ICD-10-CM | POA: Diagnosis not present

## 2021-10-12 ENCOUNTER — Ambulatory Visit (HOSPITAL_COMMUNITY)
Admission: RE | Admit: 2021-10-12 | Discharge: 2021-10-12 | Disposition: A | Discharge status: Home / Self Care | Payer: Medicare Other | Source: Ambulatory Visit | Attending: Family Medicine | Admitting: Family Medicine

## 2021-10-12 ENCOUNTER — Other Ambulatory Visit: Payer: Self-pay

## 2021-10-12 ENCOUNTER — Encounter (HOSPITAL_COMMUNITY): Payer: Self-pay

## 2021-10-12 ENCOUNTER — Ambulatory Visit (HOSPITAL_BASED_OUTPATIENT_CLINIC_OR_DEPARTMENT_OTHER)
Admission: RE | Admit: 2021-10-12 | Discharge: 2021-10-12 | Disposition: A | Discharge status: Home / Self Care | Payer: Medicare Other | Source: Ambulatory Visit | Attending: Family Medicine | Admitting: Family Medicine

## 2021-10-12 DIAGNOSIS — I493 Ventricular premature depolarization: Secondary | ICD-10-CM | POA: Diagnosis not present

## 2021-10-12 DIAGNOSIS — R55 Syncope and collapse: Secondary | ICD-10-CM

## 2021-10-12 DIAGNOSIS — R002 Palpitations: Secondary | ICD-10-CM

## 2021-10-12 LAB — NM MYOCAR MULTI W/SPECT W/WALL MOTION / EF
LV dias vol: 63 mL (ref 46–106)
LV sys vol: 20 mL
Nuc Stress EF: 69 %
Peak HR: 72 {beats}/min
RATE: 0.8
Rest HR: 56 {beats}/min
Rest Nuclear Isotope Dose: 8.5 mCi
SDS: 6
SRS: 1
SSS: 7
ST Depression (mm): 0 mm
Stress Nuclear Isotope Dose: 27 mCi
TID: 0.96

## 2021-10-12 LAB — T4, FREE: Free T4: 1.03 ng/dL (ref 0.82–1.77)

## 2021-10-12 LAB — TSH: TSH: 2.18 u[IU]/mL (ref 0.450–4.500)

## 2021-10-12 LAB — T3, FREE: T3, Free: 2.2 pg/mL (ref 2.0–4.4)

## 2021-10-12 MED ORDER — REGADENOSON 0.4 MG/5ML IV SOLN
INTRAVENOUS | Status: AC
  Administered 2021-10-12: 0.4 mg via INTRAVENOUS
  Filled 2021-10-12: qty 5

## 2021-10-12 MED ORDER — TECHNETIUM TC 99M TETROFOSMIN IV KIT
30.0000 | PACK | Freq: Once | INTRAVENOUS | Status: AC | PRN
  Administered 2021-10-12: 27 via INTRAVENOUS

## 2021-10-12 MED ORDER — SODIUM CHLORIDE FLUSH 0.9 % IV SOLN
INTRAVENOUS | Status: AC
  Administered 2021-10-12: 10 mL via INTRAVENOUS
  Filled 2021-10-12: qty 10

## 2021-10-12 MED ORDER — TECHNETIUM TC 99M TETROFOSMIN IV KIT
10.0000 | PACK | Freq: Once | INTRAVENOUS | Status: AC | PRN
  Administered 2021-10-12: 8.5 via INTRAVENOUS

## 2021-10-15 ENCOUNTER — Ambulatory Visit (INDEPENDENT_AMBULATORY_CARE_PROVIDER_SITE_OTHER): Payer: Medicare Other | Admitting: Clinical

## 2021-10-15 DIAGNOSIS — F419 Anxiety disorder, unspecified: Secondary | ICD-10-CM | POA: Diagnosis not present

## 2021-10-16 ENCOUNTER — Ambulatory Visit: Payer: Medicare Other | Admitting: Nutrition

## 2021-10-18 ENCOUNTER — Telehealth: Payer: Self-pay | Admitting: *Deleted

## 2021-10-18 NOTE — BH Specialist Note (Signed)
Integrated Behavioral Health Follow Up In-Person Visit  MRN: [id number] Name: Kathryn Compton  Number of [place] Clinician visits: 3/6 Session Start time: 2:14  Session End time: 3:21 Total time:  67  minutes  Types of Service: Individual psychotherapy  Interpretor:No. Interpretor Name and Language: n/a  Subjective: Kathryn Compton is a 70 y.o. female accompanied by  n/a Patient was referred by Darron Doom, MD for positive depression screen. Patient reports the following symptoms/concerns: Depression, anxiety, past trauma; current life stress Duration of problem: Began in childhood d/t trauma; Severity of problem:  moderately severe  Objective: Mood:  Normal  and Affect: Appropriate Risk of harm to self or others: No plan to harm self or others; hx passive SI  Life Context: Family and Social: Pt lives with supportive partner School/Work: - Self-Care: Many self-coping strategies Life Changes: Ongoing life stressors  Patient and/or Family's Strengths/Protective Factors: Social connections, Concrete supports in place (healthy food, safe environments, etc.), and Sense of purpose  Goals Addressed: Patient will:  Reduce symptoms of: anxiety, depression, and stress   Increase knowledge and/or ability of: stress reduction   Demonstrate ability to: Increase healthy adjustment to current life circumstances  Progress towards Goals: Ongoing  Interventions: Interventions utilized:  Link to [employer] and ACT (Acceptance and Commitment Therapy) Standardized Assessments completed: Not Needed  Patient and/or Family Response: Pt agrees with treatment plan  Patient Centered Plan: Patient is on the following Treatment Plan(s): IBH Assessment: Patient currently experiencing Anxiety disorder, unspecified.   Patient may benefit from continued psychoeducation and brief therapeutic interventions regarding coping with symptoms of anxiety and  depression   Plan: Follow up with behavioral health clinician on : Three weeks Behavioral recommendations:  -Continue taking Vistaril as prescribed -Continue healthy boundary-setting with any non-supportive family members -Continue daily work on book and [place] today; use as needed Referral(s): [place] (In Clinic)  [place], 

## 2021-10-18 NOTE — Telephone Encounter (Signed)
Laurine Blazer, LPN  14/99/6924  9:32 PM EST Back to Top    Notified, copy to pcp.

## 2021-10-18 NOTE — Telephone Encounter (Signed)
STRESS TEST -  Please call the patient and let her know  the stress test was considered to be low risk by the physician interpreting the study.  There was no evidence of lack of blood flow in the coronary arteries supplying the heart.    Verta Ellen, NP  10/12/2021 3:00 PM   Neah Bay, MD  10/17/2021 10:07 AM EST     Normal thyroid labs

## 2021-10-31 ENCOUNTER — Ambulatory Visit: Payer: Medicaid Other | Admitting: "Endocrinology

## 2021-11-01 ENCOUNTER — Other Ambulatory Visit: Payer: Self-pay

## 2021-11-01 ENCOUNTER — Ambulatory Visit: Payer: Medicare Other | Admitting: Clinical

## 2021-11-01 DIAGNOSIS — M533 Sacrococcygeal disorders, not elsewhere classified: Secondary | ICD-10-CM | POA: Diagnosis not present

## 2021-11-01 DIAGNOSIS — F419 Anxiety disorder, unspecified: Secondary | ICD-10-CM

## 2021-11-03 DIAGNOSIS — I10 Essential (primary) hypertension: Secondary | ICD-10-CM | POA: Diagnosis not present

## 2021-11-03 DIAGNOSIS — R002 Palpitations: Secondary | ICD-10-CM | POA: Diagnosis not present

## 2021-11-09 NOTE — BH Specialist Note (Deleted)
Integrated Behavioral Health Follow Up In-Person Visit  MRN: [id number] Name: Kathryn Compton  Number of [place] Clinician visits: {IBH Number of Visits:21014052} Session Start time: 9:45***  Session End time: 10:15*** Total time: {IBH Total Time:21014050} minutes  Types of Service: {CHL AMB TYPE OF [phone]}  Interpretor:{yes YD:[id number]} Interpretor Name and Language: ***  Subjective: Emonnie Compton is a 70 y.o. female accompanied by {Patient accompanied by:[phone]} Patient was referred by *** for ***. Patient reports the following symptoms/concerns: *** Duration of problem: ***; Severity of problem: {Mild/Moderate/Severe:20260}  Objective: Mood: {BHH MOOD:22306} and Affect: {BHH AFFECT:22307} Risk of harm to self or others: {CHL AMB BH Suicide Current Mental Status:21022748}  Life Context: Family and Social: *** School/Work: *** Self-Care: *** Life Changes: ***  Patient and/or Family's Strengths/Protective Factors: {CHL AMB BH PROTECTIVE FACTORS:[phone]}  Goals Addressed: Patient will:  Reduce symptoms of: {IBH Symptoms:21014056}   Increase knowledge and/or ability of: {IBH Patient Tools:21014057}   Demonstrate ability to: {IBH Goals:21014053}  Progress towards Goals: {CHL AMB BH PROGRESS TOWARDS GOALS:[id number]}  Interventions: Interventions utilized:  {IBH Interventions:21014054} Standardized Assessments completed: {IBH Screening Tools:21014051}  Patient and/or Family Response: ***  Patient Centered Plan: Patient is on the following Treatment Plan(s): *** Assessment: Patient currently experiencing ***.   Patient may benefit from ***.  Plan: Follow up with behavioral health clinician on : *** Behavioral recommendations: *** Referral(s): {IBH Referrals:21014055} "From scale of 1-10, how likely are you to follow plan?": ***  Caroleen Hamman Hailly Fess, LCSW

## 2021-11-15 ENCOUNTER — Other Ambulatory Visit: Payer: Self-pay

## 2021-11-15 ENCOUNTER — Encounter (HOSPITAL_COMMUNITY): Payer: Self-pay

## 2021-11-15 ENCOUNTER — Ambulatory Visit (HOSPITAL_COMMUNITY)
Admission: RE | Admit: 2021-11-15 | Discharge: 2021-11-15 | Disposition: A | Discharge status: Home / Self Care | Payer: Medicare Other | Source: Ambulatory Visit | Attending: Family Medicine | Admitting: Family Medicine

## 2021-11-15 DIAGNOSIS — R55 Syncope and collapse: Secondary | ICD-10-CM | POA: Insufficient documentation

## 2021-11-15 DIAGNOSIS — R42 Dizziness and giddiness: Secondary | ICD-10-CM

## 2021-11-15 LAB — POCT I-STAT CREATININE: Creatinine, Ser: 0.9 mg/dL (ref 0.44–1.00)

## 2021-11-15 MED ORDER — IOHEXOL 350 MG/ML SOLN
80.0000 mL | Freq: Once | INTRAVENOUS | Status: AC | PRN
  Administered 2021-11-15: 80 mL via INTRAVENOUS

## 2021-11-22 ENCOUNTER — Ambulatory Visit: Payer: Medicare Other

## 2021-11-23 ENCOUNTER — Telehealth: Payer: Self-pay | Admitting: *Deleted

## 2021-11-23 NOTE — Telephone Encounter (Signed)
Laurine Blazer, LPN  21/82/8833 74:45 PM EST Back to Top    Notified, copy to pcp.

## 2021-11-23 NOTE — Telephone Encounter (Signed)
-----   Message from Arnoldo Lenis, MD sent at 11/22/2021  4:36 PM EST ----- CT overall looks good, no evidence of significant blockages in the arteries of the neck   Zandra Abts MD ----- Message ----- From: Satira Sark, MD Sent: 11/18/2021   9:18 AM EST To: Merlene Laughter, RN, Arnoldo Lenis, MD  Patient of Dr. Harl Bowie, testing was ordered by Mr. Leonides Sake NP and sent to me inadvertently.  I will forward to Dr. Harl Bowie for his review and management.

## 2021-12-04 DIAGNOSIS — I10 Essential (primary) hypertension: Secondary | ICD-10-CM | POA: Diagnosis not present

## 2021-12-07 DIAGNOSIS — M47816 Spondylosis without myelopathy or radiculopathy, lumbar region: Secondary | ICD-10-CM | POA: Diagnosis not present

## 2021-12-07 DIAGNOSIS — M533 Sacrococcygeal disorders, not elsewhere classified: Secondary | ICD-10-CM | POA: Diagnosis not present

## 2021-12-14 ENCOUNTER — Ambulatory Visit (INDEPENDENT_AMBULATORY_CARE_PROVIDER_SITE_OTHER): Payer: Commercial Managed Care - HMO | Admitting: Clinical

## 2021-12-14 DIAGNOSIS — F419 Anxiety disorder, unspecified: Secondary | ICD-10-CM | POA: Diagnosis not present

## 2021-12-14 NOTE — BH Specialist Note (Signed)
Integrated Behavioral Health via Telemedicine Visit  12/14/2021 Kathryn Compton [id number]  Number of [place] visits: 4 Session Start time: 11:24  Session End time: 11:09 Total time: 45   Referring Provider: Darron Doom, MD Patient/Family location: Home Kathryn Compton Provider location: Kathryn Compton  All persons participating in visit: [name] and Kathryn Compton   Types of Service: Individual psychotherapy and Telephone visit  I connected with Kathryn Compton and/or Kathryn Compton  n/a  via  Telephone or Video Enabled Telemedicine Application  (Video is Caregility application) and verified that I am speaking with the correct person using two identifiers. Discussed confidentiality: Yes   I discussed the limitations of telemedicine and the availability of in person appointments.  Discussed there is a possibility of technology failure and discussed alternative modes of communication if that failure occurs.  I discussed that engaging in this telemedicine visit, they consent to the provision of behavioral healthcare and the services will be billed under their insurance.  Patient and/or legal guardian expressed understanding and consented to Telemedicine visit: Yes   Presenting Concerns: Patient and/or family reports the following symptoms/concerns: Anxious to complete writing screenplay, as "my life's work". Goal is to complete revisions to parts 1 and 2 out of 4, within financial constraints.  Duration of problem: Ongoing; Severity of problem: moderate  Patient and/or Family's Strengths/Protective Factors: Social connections, Concrete supports in place (healthy food, safe environments, etc.), Sense of purpose, and Physical Health (exercise, healthy diet, medication compliance, etc.)  Goals Addressed: Patient will:  Reduce symptoms of: anxiety, depression, and stress   Demonstrate ability to:  Increase motivation to adhere to plan of care  Progress towards Goals: Ongoing  Interventions: Interventions utilized:  ACT (Acceptance and Commitment Therapy) Standardized Assessments completed: Not Needed  Patient and/or Family Response: Pt agrees to treatment plan  Assessment: Patient currently experiencing Anxiety disorder  Patient may benefit from continued therapeutic interventions.  Plan: Follow up with behavioral health clinician on : Two weeks Behavioral recommendations:  -Continue daily work on screenplay; continue taking Vistaril and Celexa as prescribed -Continue setting healthy boundaries with non-supportive family members Referral(s): Kathryn Compton (In Clinic)  I discussed the assessment and treatment plan with the patient and/or parent/guardian. They were provided an opportunity to ask questions and all were answered. They agreed with the plan and demonstrated an understanding of the instructions.   They were advised to call back or seek an in-person evaluation if the symptoms worsen or if the condition fails to improve as anticipated.  Caroleen Hamman Nakeem Murnane, LCSW

## 2021-12-17 NOTE — BH Specialist Note (Deleted)
Integrated Behavioral Health via Telemedicine Visit  12/17/2021 Kathryn Compton [id number]  Number of [place] visits: *** Session Start time: 10:45***  Session End time: 11:15*** Total time: {IBH Total VJKQ:20601561}  Referring Provider: *** Patient/Family location: Home*** [facility] Provider location: [facility] [place] [facility]  All persons participating in visit: Patient *** and Kathryn Compton ***  Types of Service: {CHL AMB TYPE OF [phone]}  I connected with Kathryn Compton and/or Kathryn Compton's {family members:20773} via  Telephone or Video Enabled Telemedicine Application  (Video is Caregility application) and verified that I am speaking with the correct person using two identifiers. Discussed confidentiality: {YES/NO:21197}  I discussed the limitations of telemedicine and the availability of in person appointments.  Discussed there is a possibility of technology failure and discussed alternative modes of communication if that failure occurs.  I discussed that engaging in this telemedicine visit, they consent to the provision of behavioral healthcare and the services will be billed under their insurance.  Patient and/or legal guardian expressed understanding and consented to Telemedicine visit: {YES/NO:21197}  Presenting Concerns: Patient and/or family reports the following symptoms/concerns: *** Duration of problem: ***; Severity of problem: {Mild/Moderate/Severe:20260}  Patient and/or Family's Strengths/Protective Factors: {CHL AMB BH PROTECTIVE FACTORS:[phone]}  Goals Addressed: Patient will:  Reduce symptoms of: {IBH Symptoms:21014056}   Increase knowledge and/or ability of: {IBH Patient Tools:21014057}   Demonstrate ability to: {IBH Goals:21014053}  Progress towards Goals: {CHL AMB BH PROGRESS TOWARDS GOALS:[phone]}  Interventions: Interventions utilized:  {IBH  Interventions:21014054} Standardized Assessments completed: {IBH Screening Tools:21014051}  Patient and/or Family Response: ***  Assessment: Patient currently experiencing ***.   Patient may benefit from ***.  Plan: Follow up with behavioral health clinician on : *** Behavioral recommendations: *** Referral(s): {IBH Referrals:21014055}  I discussed the assessment and treatment plan with the patient and/or parent/guardian. They were provided an opportunity to ask questions and all were answered. They agreed with the plan and demonstrated an understanding of the instructions.   They were advised to call back or seek an in-person evaluation if the symptoms worsen or if the condition fails to improve as anticipated.  Kathryn Hamman Deah Ottaway, LCSW

## 2021-12-26 ENCOUNTER — Telehealth: Payer: Self-pay | Admitting: Neurology

## 2021-12-26 NOTE — Telephone Encounter (Signed)
Last appt did not have a return planned - follow up if needed. She has an appt with her PCP and will ask them to place a referral to ENT. Appt in March cancelled.

## 2021-12-26 NOTE — Telephone Encounter (Signed)
Patient called and is wondering if she needs her appointment in March. She said all she needs from Dr. Krista Blue is a referral to an ENT. She asked for a call back.

## 2021-12-27 DIAGNOSIS — I951 Orthostatic hypotension: Secondary | ICD-10-CM | POA: Diagnosis not present

## 2021-12-27 DIAGNOSIS — R42 Dizziness and giddiness: Secondary | ICD-10-CM | POA: Diagnosis not present

## 2022-01-02 NOTE — BH Specialist Note (Addendum)
Integrated Behavioral Health via Telemedicine Visit  01/15/2022 Kathryn Compton [id number]  Number of [place] Clinician visits: 5-Fifth Visit  Session Start time: 2633   Session End time: 1111   Total time in minutes: 55    Referring Provider: Darron Doom, MD Patient/Family location: Home [facility] Provider location: [facility] [place] [facility]  All persons participating in visit: [name] and Kathryn Compton   Types of Service: Individual psychotherapy and Telephone visit  I connected with Kathryn Compton and/or Kathryn Compton's  n/a  via  Telephone or Video Enabled Telemedicine Application  (Video is Caregility application) and verified that I am speaking with the correct person using two identifiers. Discussed confidentiality: Yes   I discussed the limitations of telemedicine and the availability of in person appointments.  Discussed there is a possibility of technology failure and discussed alternative modes of communication if that failure occurs.  I discussed that engaging in this telemedicine visit, they consent to the provision of behavioral healthcare and the services will be billed under their insurance.  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  Duration of problem: Ongoing; Severity of problem: moderate  Patient and/or Family's Strengths/Protective Factors: Social connections, Concrete supports in place (healthy food, safe environments, etc.), Sense of purpose, and Physical Health (exercise, healthy diet, medication compliance, etc.)  Goals Addressed: Patient will:  Reduce symptoms of:  anxiety, depression, and stress   Progress towards Goals: Ongoing  Interventions: Interventions utilized:  ACT (Acceptance and Commitment Therapy) Standardized Assessments completed: GAD-7 and PHQ 9  Patient and/or Family Response: Pt agrees with ongoing treatment plan  Assessment: Patient currently experiencing Anxiety disorder.   Patient may benefit from continued therapeutic interventions.  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 They were provided an opportunity to ask questions and all were answered. They agreed with the plan and demonstrated an understanding of the instructions.   They were advised to call back or seek an in-person evaluation if the symptoms worsen or if the condition fails to improve as anticipated.  Garlan Fair, LCSW  Depression screen [facility] 2/9 01/15/2022 09/28/2021 09/11/2021 05/09/2021 08/01/2020  Decreased Interest 0 1 2 0 1  Down, Depressed, Hopeless 0 3 2 1 1   PHQ - 2 Score 0 4 4 1 2   Altered sleeping 0 3 3 2 3   Tired, decreased energy 1 3 3 2 3   Change in appetite 0 1 2 0 1  Feeling bad or failure about yourself  3 3 3 2 3   Trouble concentrating 3 0 0 2 0  Moving slowly or fidgety/restless 0 0 0 1 0  Suicidal thoughts 0 1 - 0 1  PHQ-9 Score 7 15 15 10 13   Difficult doing work/chores - - - Not difficult at all Not difficult at all   GAD 7 : Generalized Anxiety Score 01/15/2022 09/28/2021 05/09/2021 08/01/2020  Nervous, Anxious, on Edge 1 3 2 3   Control/stop worrying 0 0 2 1  Worry too much  - different things 0 2 3 1   Trouble relaxing 0 3 2 1   Restless 3 3 2 1   Easily annoyed or irritable 1 1 3  0  Afraid - awful might happen 0 0 2 0  Total GAD 7 Score 5 12 16 7   Anxiety Difficulty - - - Somewhat difficult

## 2022-01-15 ENCOUNTER — Ambulatory Visit (INDEPENDENT_AMBULATORY_CARE_PROVIDER_SITE_OTHER): Payer: Medicare Other | Admitting: Clinical

## 2022-01-15 DIAGNOSIS — F419 Anxiety disorder, unspecified: Secondary | ICD-10-CM | POA: Diagnosis not present

## 2022-01-15 NOTE — BH Specialist Note (Signed)
Integrated Behavioral Health via Telemedicine Visit  01/15/2022 Kathryn Compton [id number]  Number of [place] Clinician visits: 5-Fifth Visit  Session Start time: 4580   Session End time: 1111  Total time in minutes: 55   Referring Provider: Darron Doom, MD Patient/Family location: Home [facility] Provider location: [facility] [place] [facility]  All persons participating in visit: [name] and Kathryn Compton   Types of Service: Individual psychotherapy and Telephone visit  I connected with Kathryn Compton and/or Kathryn Compton's  n/a  via  Telephone or Video Enabled Telemedicine Application  (Video is Caregility application) and verified that I am speaking with the correct person using two identifiers. Discussed confidentiality: Yes   I discussed the limitations of telemedicine and the availability of in person appointments.  Discussed there is a possibility of technology failure and discussed alternative modes of communication if that failure occurs.  I discussed that engaging in this telemedicine visit, they consent to the provision of behavioral healthcare and the services will be billed under their insurance.  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  Duration of problem: Ongoing; Severity of problem: moderate  Patient and/or Family's Strengths/Protective Factors: Social connections, Concrete supports in place (healthy food, safe environments, etc.), Sense of purpose, and Physical Health (exercise, healthy diet, medication compliance, etc.)  Goals Addressed: Patient will:  Reduce symptoms of: anxiety, depression, and stress    Demonstrate ability to: Increase healthy adjustment to current life circumstances  Progress towards Goals: Ongoing  Interventions: Interventions utilized:  ACT (Acceptance and Commitment Therapy) Standardized Assessments completed: Not Needed  Patient and/or Family Response: Pt agrees with treatment plan  Assessment: Patient currently experiencing Anxiety disorder.   Patient may benefit from continued therapeutic interventions.  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 They were provided an opportunity to ask questions and all were answered. They agreed with the plan and demonstrated an understanding of the instructions.   They were advised to call back or seek an in-person evaluation if the symptoms worsen or if the condition fails to improve as anticipated.  Garlan Fair, LCSW  Depression screen [facility] 2/9 01/15/2022 09/28/2021 09/11/2021 05/09/2021 08/01/2020  Decreased Interest 0 1 2 0 1  Down, Depressed, Hopeless 0 3 2 1 1   PHQ - 2 Score 0 4 4 1 2   Altered sleeping 0 3 3 2 3   Tired, decreased energy 1 3 3 2 3   Change in appetite 0 1 2 0 1  Feeling bad or failure about yourself  3 3 3 2 3   Trouble concentrating 3 0 0 2 0  Moving slowly or fidgety/restless 0 0 0 1 0  Suicidal thoughts 0 1 - 0 1  PHQ-9 Score 7 15 15 10 13   Difficult doing work/chores - - - Not difficult at all  Not difficult at all   GAD 7 : Generalized Anxiety Score 01/15/2022 09/28/2021 05/09/2021  08/01/2020  Nervous, Anxious, on Edge 1 3 2 3   Control/stop worrying 0 0 2 1  Worry too much - different things 0 2 3 1   Trouble relaxing 0 3 2 1   Restless 3 3 2 1   Easily annoyed or irritable 1 1 3  0  Afraid - awful might happen 0 0 2 0  Total GAD 7 Score 5 12 16 7   Anxiety Difficulty - - - Somewhat difficult

## 2022-01-23 ENCOUNTER — Ambulatory Visit (INDEPENDENT_AMBULATORY_CARE_PROVIDER_SITE_OTHER): Payer: Medicare Other | Admitting: Cardiology

## 2022-01-23 ENCOUNTER — Encounter: Payer: Self-pay | Admitting: Cardiology

## 2022-01-23 VITALS — BP 140/74 | HR 66 | Ht 67.0 in | Wt 274.0 lb

## 2022-01-23 DIAGNOSIS — R42 Dizziness and giddiness: Secondary | ICD-10-CM

## 2022-01-23 DIAGNOSIS — R002 Palpitations: Secondary | ICD-10-CM | POA: Diagnosis not present

## 2022-01-23 DIAGNOSIS — R55 Syncope and collapse: Secondary | ICD-10-CM

## 2022-01-23 NOTE — Progress Notes (Signed)
Clinical Summary Ms. Aredale is a 71 y.o.female seen today for follow up of the following medical problems.   Palpitations - reports symptoms since her 20s - recently increase in symptoms, though improved on propranolol - mild symptoms at times on propranolol - reports during prior medical testing, was told heart rhythm was variable - 1 cup of coffee a day. No EtoH    - monitor showed symptoms correlated with SR with PVCs and bigeminy, isolated 4 beat run SNVT - PA Leonides Sake had referred for nuclear stress test 10/2021 that showed no ischemia.   - palpitations have resolved.  - she lowered propatnolol to 40mg  bid as opposed to tid, no change in symptoms.    2. Syncope -episode of syncope in Nov 2020 - saw neurology, extensive workup  - occurred while in the bathroom whle getting ready to clean. No prodrome, fell down toward bathtub though was able to partial cath herself. Very brief LOC.  - no recurrence  -11/2021 CTA head/neck: no significant disease - no recent syncope     3. Headaches - in general gets episodes of feeling in neck into left sided of head, change in hearing. Lingers for few hours after. Can at times have some associated palpitations.  - extensive neuro workup previously   4. Dizziness - reports abnormal orthostatic vitals at pcp's office, SBP dropped 30 points - drinks water x 2 bottles. 1 cup of coffee in AM. Rare sodas. Rare body armor.   Past Medical History:  Diagnosis Date   Anxiety    Depression    Hypertension    Motion sickness    Palpitations    Plantar fasciitis    Seizures (HCC)    Vertigo      Allergies  Allergen Reactions   Other Itching and Other (See Comments)    Antidepressants-Itching, Gaining Weight      Current Outpatient Medications  Medication Sig Dispense Refill   citalopram (CELEXA) 20 MG tablet Take 20 mg by mouth daily.     famciclovir (FAMVIR) 500 MG tablet Take 500 mg by mouth 3 (three) times daily. Pt  takes as needed.     hydrOXYzine (ATARAX/VISTARIL) 25 MG tablet Take 25 mg by mouth 3 (three) times daily as needed. Pt takes 1/4th of a pill if needed     propranolol (INDERAL) 40 MG tablet Take 1 tablet (40 mg total) by mouth 3 (three) times daily. 90 tablet 6   No current facility-administered medications for this visit.     Past Surgical History:  Procedure Laterality Date   BREAST REDUCTION SURGERY     COLONOSCOPY N/A 09/06/2019   Procedure: COLONOSCOPY;  Surgeon: Danie Binder, MD;  [facility];  Service: Endoscopy;  Laterality: N/A;  2:00   POLYPECTOMY  09/06/2019   Procedure: POLYPECTOMY;  Surgeon: Danie Binder, MD;  Location: [facility];  Service: Endoscopy;;   REDUCTION MAMMAPLASTY     TUBAL LIGATION       Allergies  Allergen Reactions   Other Itching and Other (See Comments)    Antidepressants-Itching, Gaining Weight       Family History  Problem Relation Age of Onset   Other Mother        unsure of medical history   Other Father        unknown medical history   Drug abuse Brother    Suicidality Brother    Colon cancer Neg Hx      Social History  Ms. Gwynn reports that she has never smoked. She has never used smokeless tobacco. Ms. Altidor reports that she does not currently use alcohol.   Review of Systems CONSTITUTIONAL: No weight loss, fever, chills, weakness or fatigue.  HEENT: Eyes: No visual loss, blurred vision, double vision or yellow sclerae.No hearing loss, sneezing, congestion, runny nose or sore throat.  SKIN: No rash or itching.  CARDIOVASCULAR: per hpi RESPIRATORY: No shortness of breath, cough or sputum.  GASTROINTESTINAL: No anorexia, nausea, vomiting or diarrhea. No abdominal pain or blood.  GENITOURINARY: No burning on urination, no polyuria NEUROLOGICAL: No headache, dizziness, syncope, paralysis, ataxia, numbness or tingling in the extremities. No change in bowel or bladder control.  MUSCULOSKELETAL: No  muscle, back pain, joint pain or stiffness.  LYMPHATICS: No enlarged nodes. No history of splenectomy.  PSYCHIATRIC: No history of depression or anxiety.  ENDOCRINOLOGIC: No reports of sweating, cold or heat intolerance. No polyuria or polydipsia.  Marland Kitchen   Physical Examination Today's Vitals   01/23/22 1118  BP: 140/74  Pulse: 66  SpO2: 98%  Weight: 274 lb (124.3 kg)  Height: 5\' 7"  (1.702 m)   Body mass index is 42.91 kg/m.  Gen: resting comfortably, no acute distress HEENT: no scleral icterus, pupils equal round and reactive, no palptable cervical adenopathy,  CV: RRR, no m/r/g, no jvd Resp: Clear to auscultation bilaterally GI: abdomen is soft, non-tender, non-distended, normal bowel sounds, no hepatosplenomegaly MSK: extremities are warm, no edema.  Skin: warm, no rash Neuro:  no focal deficits Psych: appropriate affect   Diagnostic Studies 09/2021 30 day monitor 30 day monitor Min HR 52, Max HR 125, Avg HR 69 Reported symptoms correlated with sinus rhythm with PVCs/bigeminy, 4 beat run of NSVT asymptomatic.   10/2021 nuclear stress   The study is normal. There are no perfusion defects consistent with prior infarct or current ischemia.  The study is low risk.   No ST deviation was noted.   Left ventricular function is normal. Nuclear stress EF: 69 %. The left ventricular ejection fraction is hyperdynamic (>65%). End diastolic cavity size is normal.   11/2021 CTA head and neck CTA neck:   1. The common carotid and internal carotid arteries are patent within the neck without stenosis or significant atherosclerotic disease. 2. The vertebral arteries are patent within the neck without stenosis. Minimal non-stenotic calcified plaque within the V1 right vertebral artery.   CTA head:   1. No intracranial large vessel occlusion or proximal high-grade arterial stenosis. 2. Mild atherosclerotic disease within the intracranial left ICA and bilateral middle cerebral  arteries, as described.  Assessment and Plan   Palpitations -monitor with symptomatic PVCs, doing well on propranolol 40mg  bid - continue current meds - EKG show SR   2. Syncope - isolated episode nearly 2 years ago. No specific prodrome. Without recurrence would this cardiogenic syncope unlikely - monitor was benign - continue to monitor.    3. Headaches - unclear episodes of pain starting in neck spreading into head with some auditory changes. Extensive neuro workup. Not typical for anything cardiac  4. Orthostatilc hypotension - reports abnormal orthostatics at pcp office - overall poor oral hydration, we discusssed first step would be increaseing oral hydration - if symptoms of orthostatic dizziness with aggressive hydration could consider compression stockings.      Arnoldo Lenis, M.D.

## 2022-01-23 NOTE — Patient Instructions (Signed)

## 2022-01-23 NOTE — Progress Notes (Signed)
Clinical Summary Kathryn Compton is a 71 y.o.female  Palpitations - reports symptoms since her 47s - recently increase in symptoms, though improved on propranolol - mild symptoms at times on propranolol - reports during prior medical testing, was told heart rhythm was variable - 1 cup of coffee a day. No EtoH    - monitor showed symptoms correlated with SR with PVCs and bigeminy, isolated 4 beat run SNVT - PA Kathryn Compton had referred for nuclear stress test 10/2021 that showed no ischemia.    2. Syncope -episode of syncope in Nov 2020 - saw neurology, extensive workup  - occurred while in the bathroom whle getting ready to clean. No prodrome, fell down toward bathtub though was able to partial cath herself. Very brief LOC.  - no recurrence  -11/2021 CTA head/neck: no significant disease     3. Headaches - in general gets episodes of feeling in neck into left sided of head, change in hearing. Lingers for few hours after. Can at times have some associated palpitations.  - extensive neuro workup previously Past Medical History:  Diagnosis Date   Anxiety    Depression    Hypertension    Motion sickness    Palpitations    Plantar fasciitis    Seizures (HCC)    Vertigo      Allergies  Allergen Reactions   Other Itching and Other (See Comments)    Antidepressants-Itching, Gaining Weight      Current Outpatient Medications  Medication Sig Dispense Refill   citalopram (CELEXA) 20 MG tablet Take 20 mg by mouth daily.     famciclovir (FAMVIR) 500 MG tablet Take 500 mg by mouth 3 (three) times daily. Pt takes as needed.     hydrOXYzine (ATARAX/VISTARIL) 25 MG tablet Take 25 mg by mouth 3 (three) times daily as needed. Pt takes 1/4th of a pill if needed     propranolol (INDERAL) 40 MG tablet Take 1 tablet (40 mg total) by mouth 3 (three) times daily. 90 tablet 6   No current facility-administered medications for this visit.     Past Surgical History:  Procedure  Laterality Date   BREAST REDUCTION SURGERY     COLONOSCOPY N/A 09/06/2019   Procedure: COLONOSCOPY;  Surgeon: Danie Binder, MD;  [facility];  Service: Endoscopy;  Laterality: N/A;  2:00   POLYPECTOMY  09/06/2019   Procedure: POLYPECTOMY;  Surgeon: Danie Binder, MD;  [facility];  Service: Endoscopy;;   REDUCTION MAMMAPLASTY     TUBAL LIGATION       Allergies  Allergen Reactions   Other Itching and Other (See Comments)    Antidepressants-Itching, Gaining Weight       Family History  Problem Relation Age of Onset   Other Mother        unsure of medical history   Other Father        unknown medical history   Drug abuse Brother    Suicidality Brother    Colon cancer Neg Hx      Social History Ms. [place] reports that she has never smoked. She has never used smokeless tobacco. Ms. Leidner reports that she does not currently use alcohol.   Review of Systems CONSTITUTIONAL: No weight loss, fever, chills, weakness or fatigue.  HEENT: Eyes: No visual loss, blurred vision, double vision or yellow sclerae.No hearing loss, sneezing, congestion, runny nose or sore throat.  SKIN: No rash or itching.  CARDIOVASCULAR:  RESPIRATORY: No shortness  of breath, cough or sputum.  GASTROINTESTINAL: No anorexia, nausea, vomiting or diarrhea. No abdominal pain or blood.  GENITOURINARY: No burning on urination, no polyuria NEUROLOGICAL: No headache, dizziness, syncope, paralysis, ataxia, numbness or tingling in the extremities. No change in bowel or bladder control.  MUSCULOSKELETAL: No muscle, back pain, joint pain or stiffness.  LYMPHATICS: No enlarged nodes. No history of splenectomy.  PSYCHIATRIC: No history of depression or anxiety.  ENDOCRINOLOGIC: No reports of sweating, cold or heat intolerance. No polyuria or polydipsia.  Marland Kitchen   Physical Examination There were no vitals filed for this visit. There were no vitals filed for this visit.  Gen:  resting comfortably, no acute distress HEENT: no scleral icterus, pupils equal round and reactive, no palptable cervical adenopathy,  CV Resp: Clear to auscultation bilaterally GI: abdomen is soft, non-tender, non-distended, normal bowel sounds, no hepatosplenomegaly MSK: extremities are warm, no edema.  Skin: warm, no rash Neuro:  no focal deficits Psych: appropriate affect   Diagnostic Studies 09/2021 30 day monitor 30 day monitor Min HR 52, Max HR 125, Avg HR 69 Reported symptoms correlated with sinus rhythm with PVCs/bigeminy, 4 beat run of NSVT asymptomatic.   10/2021 nuclear stress   The study is normal. There are no perfusion defects consistent with prior infarct or current ischemia.  The study is low risk.   No ST deviation was noted.   Left ventricular function is normal. Nuclear stress EF: 69 %. The left ventricular ejection fraction is hyperdynamic (>65%). End diastolic cavity size is normal.   11/2021 CTA head and neck CTA neck:   1. The common carotid and internal carotid arteries are patent within the neck without stenosis or significant atherosclerotic disease. 2. The vertebral arteries are patent within the neck without stenosis. Minimal non-stenotic calcified plaque within the V1 right vertebral artery.   CTA head:   1. No intracranial large vessel occlusion or proximal high-grade arterial stenosis. 2. Mild atherosclerotic disease within the intracranial left ICA and bilateral middle cerebral arteries, as described.  Assessment and Plan   Palpitations - will obtain 30 day event monitor for palpitations   2. Syncope - isolated episode nearly 2 years ago. No specific prodrome. Without recurrence would this cardiogenic syncope unlikely - f/u monitor results   3. Headaches - unclear episodes of pain starting in neck spreading into head with some auditory changes. Extensive neuro workup. Not typical for anything cardiac though can have some palpitatoins  at times with it - f/u monitor results     Arnoldo Lenis, M.D., F.A.C.C.

## 2022-01-27 DIAGNOSIS — I10 Essential (primary) hypertension: Secondary | ICD-10-CM | POA: Diagnosis not present

## 2022-01-29 ENCOUNTER — Ambulatory Visit (INDEPENDENT_AMBULATORY_CARE_PROVIDER_SITE_OTHER): Payer: Medicare Other | Admitting: Clinical

## 2022-01-29 DIAGNOSIS — F419 Anxiety disorder, unspecified: Secondary | ICD-10-CM

## 2022-01-31 NOTE — BH Specialist Note (Unsigned)
ADULT Comprehensive Clinical Assessment (CCA) Note   01/31/2022 Meggan Dhaliwal [id number]   Referring Provider: *** Session Start time: 8341    Session End time: 1140  Total time in minutes: 54   SUBJECTIVE: Noah Pelaez is a 71 y.o.   female accompanied by {CHL AMB ACCOMPANIED DQ:[id number]}  Genette Huertas was seen in consultation at the request of Carrolyn Meiers, MD for evaluation of {CHL AMB PED BEHAVIORAL LEARNING PROBLEMS:210130101}.  Types of Service: {CHL AMB TYPE OF SERVICE:[phone]}  Reason for referral in patient/family's own words:  ***    She likes to be called ***.  She came to the appointment with {CHL AMB ACCOMPANIED JH:[id number]}.  Primary language at home is {CHL AMB BASIC LANGUAGE SPOKEN:[phone]}  Constitutional Appearance: {CHL AMB PED CONSTITUTIONAL:210130113}, well-nourished, well-developed, alert and well-appearing  (Patient to answer as appropriate) Gender identity: *** Sex assigned at birth: *** Pronouns: {he/she/they:23295}   Mental status exam:   General Appearance /Behavior:  {BHH GENERALAPPEARANCE/BEHAVIOR:22300} Eye Contact:  {BHH EYE CONTACT:22301} Motor Behavior:  {BHH MOTOR BEHAVIOR:22302} Speech:  {BHH SPEECH:22304} Level of Consciousness:  {BHH LEVEL OF CONSCIOUSNESS:22305} Mood:  {BHH MOOD:22306} Affect:  {BHH AFFECT:22307} Anxiety Level:  {BHH ANXIETY LEVEL:22308} Thought Process:  {BHH THOUGHT PROCESS:22309} Thought Content:  {BHH THOUGHT CONTENT:22310} Perception:  {BHH PERCEPTION:22311} Judgment:  {BHH JUDGMENT:22312} Insight:  {BHH INSIGHT:22313}   Current Medications and therapies: She is taking:  {CHL AMB TAKING MEDICATIONS:220130102}   Therapies:  {CHL AMB THERAPIES:[phone]}  Family history: Family mental illness:  {CHL AMB FAMILY MENTAL [phone]} Family school achievement history:  {CHL AMB FAMILY SCHOOL ACHIEVEMENT HISTORY:[phone]} Other relevant family history:  {CHL AMB  OTHER RELEVANT FAMILY HISTORY:210130114}  Social History: Now living with {CHL AMB LIVING JEHU:[phone]}. {CHL AMB PED PARENT/GUARDIAN RELATIONS:210130115}. Employment:  {CHL AMB PARENT/GUARDIAN EMPLOYMENT:[phone]} Main caregiver's health:  {CHL AMB CAREGIVER HEALTH:[phone]} Religious or Spiritual Beliefs: ***  Mood: She {CHL AMB PARENTS MOOD CONCERNS:[phone]}. {CHL AMB MOOD:[phone]}  Negative Mood Concerns {CHL AMB NEGATIVE THOUGHTS:210130169}. Self-injury:  {CHL AMB DID NOT CHY:850277412} Suicidal ideation:  {CHL AMB DID NOT INO:676720947} Suicide attempt:  {CHL AMB DID NOT SJG:283662947}  Additional Anxiety Concerns: Panic attacks:  {CHL AMB YES/NO/NOT APPLICABLE:210130111} Obsessions:  {CHL AMB YES/NO/NOT APPLICABLE:210130111} Compulsions:  {CHL AMB YES/NO/NOT APPLICABLE:210130111}  Stressors:  {CHL AMB BH [phone]}  Alcohol and/or Substance Use: Have you recently consumed alcohol? {YES/NO/WILD MLYYT:03546}  Have you recently used any drugs?  {YES/NO/WILD FKCLE:75170}  Have you recently consumed any tobacco? {YES/NO/WILD CARDS:18581} Does patient seem concerned about dependence or abuse of any substance? {YES/NO/WILD YFVCB:44967}  Substance Use Disorder Checklist:  {CHL AMB BH CHECKLIST FOR SUBSTANCE USE [phone]}  Severity Risk Scoring based on DSM-5 Criteria for Substance Use Disorder. The presence of at least two (2) criteria in the last 12 months indicate a substance use disorder. The severity of the substance use disorder is defined as:  Mild: Presence of 2-3 criteria Moderate: Presence of 4-5 criteria Severe: Presence of 6 or more criteria  Traumatic Experiences: History or current traumatic events (natural disaster, house fire, etc.)? {YES/NO/WILD RFFMB:84665} History or current physical trauma?  {YES/NO/WILD LDJTT:01779} History or current emotional trauma?  {YES/NO/WILD TJQZE:09233} History or current sexual trauma?   {YES/NO/WILD AQTMA:26333} History or current domestic or intimate partner violence?  {YES/NO/WILD LKTGY:56389} History of bullying:  {YES/NO/WILD CARDS:18581}  Risk Assessment: Suicidal or homicidal thoughts?   {YES/NO/WILD HTDSK:87681} Self injurious behaviors?  {YES/NO/WILD LXBWI:20355} Guns in the home?  {YES/NO/WILD HRCBU:38453}  Self Harm Risk Factors: {CHL AMB BH SELF HARM RISK FACTORS:[phone]}  Self Harm Thoughts?: {CHL AMB BH SELF HARM THOUGHTS:4756111713}  Patient and/or Family's Strengths/Protective Factors: {CHL AMB BH PROTECTIVE [phone]}  Patient's and/or Family's Goals in their own words: ***  Interventions: Interventions utilized:  {IBH Interventions:21014054:::0}   Patient and/or Family Response: ***  Standardized Assessments completed: {IBH Screening Tools:21014051:::0}  Patient Centered Plan: Patient is on the following Treatment Plan(s):  ***  Coordination of Care: {CHL AMB BH COORDINATION OF CARE:[phone]}  DSM-5 Diagnosis: ***  Recommendations for Services/Supports/Treatments: ***  Progress towards Goals: {CHL AMB BH PROGRESS TOWARDS TYOMA:0045997741}  Treatment Plan Summary: Behavioral Health Clinician will: {CHL AMB BH TREATMENT PLAN SUMMARY THERAPIST SELT:5320233435}  Individual will: {CHL AMB BH TREATMENT PLAN SUMMARY INDIVDUAL WYSH:6837290211}  Referral(s): {IBH Referrals:21014055}  Mackinze Criado C Bevin Mayall, LCSW

## 2022-02-06 ENCOUNTER — Telehealth: Payer: Self-pay | Admitting: "Endocrinology

## 2022-02-06 NOTE — Telephone Encounter (Addendum)
?  To whom it may concern: ? ?This letter of medical necessity is documented on behalf of Ms. Kathryn Compton, date of birth [birth year] ? ?This patient was seen at 2 different occasions in this clinic in consult for weight management.  During her last visit, she weighed 284 pounds giving her a BMI of 44.48. ?She has tried caloric restrictions, choosing more whole food options to manage weight with no significant success. ?Her insurance did not provide coverage for weight loss medications. ?She would like to explore her next options.  She is deemed to be a good candidate for bariatric surgery. ?This procedure will afford her a chance to lose enough weight to avoid metabolic diseases including but not limited to type 2 diabetes, dyslipidemia,  hypertension; and indirectly lower her risk of cardiovascular diseases including coronary disease, stroke, peripheral arterial disease. ?She does not have diabetes/prediabetes.  Her most recent A1c was 5.4% in March 2022. ?From endocrinology point of view, she is cleared for this procedure while she is still maximizing her on lifestyle change options. ?Any assistance offered to her achieve this goal is appreciated. ? ?Dr. Glade Lloyd -Endocrinologist ? ? ?

## 2022-02-07 NOTE — Telephone Encounter (Signed)
Called patient to let her know her paper work is ready for pick up. NO answer ?

## 2022-02-08 NOTE — Telephone Encounter (Signed)
Spoke w/ patient. She will come pick up paperwork, its at the front desk ?

## 2022-02-12 ENCOUNTER — Ambulatory Visit (INDEPENDENT_AMBULATORY_CARE_PROVIDER_SITE_OTHER): Payer: Medicare Other | Admitting: Clinical

## 2022-02-12 DIAGNOSIS — F411 Generalized anxiety disorder: Secondary | ICD-10-CM

## 2022-02-12 DIAGNOSIS — F419 Anxiety disorder, unspecified: Secondary | ICD-10-CM

## 2022-02-12 NOTE — Telephone Encounter (Signed)
Left a VM for patient to please come pick up her paperwork. ?

## 2022-02-12 NOTE — Telephone Encounter (Signed)
Patient picked up paperwork.

## 2022-02-12 NOTE — Patient Instructions (Signed)
[facility] ?[place] ?[place],  [address] ?[phone] (main office) ?[phone] (Raha Tennison's officw) ? ?[place] ?[email or web address]  ? ?[place] ?[email or web address]  ? ?[place] ?[email or web address] ?

## 2022-02-13 NOTE — BH Specialist Note (Signed)
Integrated Behavioral Health via Telemedicine Visit ? ?02/27/2022 ?[name] ?[id number] ? ?Number of [place] Clinician visits: Additional Visit ? ?Session Start time: 59 ?  ?Session End time: 5284 ? ?Total time in minutes: 50 ? ? ?Referring Provider: Darron Doom, MD ?Patient/Family location: Home ?[facility] Provider location: [facility] [employer] [facility] ? ?All persons participating in visit: [name] and Kathryn Compton  ? ?Types of Service: Individual psychotherapy and Telephone visit ? ?I connected with Kathryn Compton and/or Kathryn Compton's  n/a  via  Telephone or Video Enabled Telemedicine Application  (Video is Caregility application) and verified that I am speaking with the correct person using two identifiers. Discussed confidentiality: Yes  ? ?I discussed the limitations of telemedicine and the availability of in person appointments.  Discussed there is a possibility of technology failure and discussed alternative modes of communication if that failure occurs. ? ?I discussed that engaging in this telemedicine visit, they consent to the provision of behavioral healthcare and the services will be billed under their insurance. ? ?Patient and/or legal guardian expressed understanding and consented to Telemedicine visit: Yes  ? ?Presenting Concerns: ?Patient and/or family reports the following symptoms/concerns: Increased life stress after boyfriend's injury while hiking and current caretaking responsibilities.  ?Duration of problem: Ongoing; Severity of problem: moderate ? ?Patient and/or Family's Strengths/Protective Factors: ?Social connections, Concrete supports in place (healthy food, safe environments, etc.), Sense of purpose, and Physical Health (exercise, healthy diet, medication compliance, etc.) ? ?Goals Addressed: ?Patient will: ? Reduce symptoms of: anxiety, depression, and stress  ? Demonstrate ability to: Increase  healthy adjustment to current life circumstances ? ?Progress towards Goals: ?Ongoing ? ?Interventions: ?Interventions utilized:  Link to [employer] and Supportive Reflection ?Standardized Assessments completed: Not Needed ? ?Patient and/or Family Response: Patient agrees with treatment plan ? ?Assessment: ?Patient currently experiencing Generalized anxiety disorder.  ? ?Patient may benefit from continued therapeutic interventions. ? ?Plan: ?Follow up with behavioral health clinician on : Two weeks ?Behavioral recommendations:  ?-Continue taking Celexa and Vistaril as prescribed ?-Consider future hikes with local hiking group or others, as discussed  ?-Continue plan to set up sunny spot outdoors to sit, on days unable to walk/hike ?Referral(s): [place] (In Clinic) ? ?I discussed the assessment and treatment plan with the patient and/or parent/guardian. They were provided an opportunity to ask questions and all were answered. They agreed with the plan and demonstrated an understanding of the instructions. ?  ?They were advised to call back or seek an in-person evaluation if the symptoms worsen or if the condition fails to improve as anticipated. ? ?Garlan Fair, LCSW ?

## 2022-02-19 ENCOUNTER — Ambulatory Visit: Payer: Medicaid Other | Admitting: Neurology

## 2022-02-24 DIAGNOSIS — I10 Essential (primary) hypertension: Secondary | ICD-10-CM | POA: Diagnosis not present

## 2022-02-26 ENCOUNTER — Ambulatory Visit: Payer: Medicaid Other | Admitting: "Endocrinology

## 2022-02-27 ENCOUNTER — Ambulatory Visit (INDEPENDENT_AMBULATORY_CARE_PROVIDER_SITE_OTHER): Payer: Medicare Other | Admitting: Clinical

## 2022-02-27 DIAGNOSIS — F411 Generalized anxiety disorder: Secondary | ICD-10-CM | POA: Diagnosis not present

## 2022-03-06 NOTE — BH Specialist Note (Signed)
Integrated Behavioral Health via Telemedicine Visit ? ?03/20/2022 ?Kathryn Compton ?[id number] ? ?Number of [place] Clinician visits: Additional Visit ? ?Session Start time: 1046 ?  ?Session End time: 1120 ? ?Total time in minutes: 34 ? ? ?Referring Provider: Darron Doom, MD ?Patient/Family location: Home ?[facility] Provider location: [facility] [employer] [facility] ? ?All persons participating in visit: [name] and [place]  ? ?Types of Service: Individual psychotherapy and Telephone visit ? ?I connected with [name] and/or [name] Topp's  n/a  via  Telephone or Video Enabled Telemedicine Application  (Video is Caregility application) and verified that I am speaking with the correct person using two identifiers. Discussed confidentiality: Yes  ? ?I discussed the limitations of telemedicine and the availability of in person appointments.  Discussed there is a possibility of technology failure and discussed alternative modes of communication if that failure occurs. ? ?I discussed that engaging in this telemedicine visit, they consent to the provision of behavioral healthcare and the services will be billed under their insurance. ? ?Patient and/or legal guardian expressed understanding and consented to Telemedicine visit: Yes  ? ?Presenting Concerns: ?Patient and/or family reports the following symptoms/concerns: Noticing that taking 1/4 pill of hydroxyzine before bed is not helping to reduce anxiousness at night and waking up easily; will start taking 1/2 pill at night as needed; continuing to spend time daily outdoors, and taking break from screenplay.  ?Duration of problem: Ongoing; Severity of problem: moderate ? ?Patient and/or Family's Strengths/Protective Factors: ?Social connections, Concrete supports in place (healthy food, safe environments, etc.), Sense of purpose, and Physical Health (exercise, healthy diet,  medication compliance, etc.) ? ?Goals Addressed: ?Patient will: ? Reduce symptoms of: anxiety, depression, and stress ? ?Progress towards Goals: ?Ongoing ? ?Interventions: ?Interventions utilized:  Medication Monitoring and Supportive Reflection ?Standardized Assessments completed: Not Needed ? ?Patient and/or Family Response: Patient agrees with treatment plan. ? ? ?Assessment: ?Patient currently experiencing Generalized anxiety disorder.  ? ?Patient may benefit from continued therapeutic interventions ?. ? ?Plan: ?Follow up with behavioral health clinician on : Two weeks ?Behavioral recommendations:  ?-Continue taking Celexa and Vistaril as prescribed (okay to increase vistaril to 1/2 pill at bedtime from 1/4 pill prior, per medical provider) ?-Continue daily outdoor time (hikes,yard work, Social research officer, government.) and much-needed break (minus the guilt) from [place] ? ?Referral(s): [place] (In Clinic) ? ?I discussed the assessment and treatment plan with the patient and/or parent/guardian. They were provided an opportunity to ask questions and all were answered. They agreed with the plan and demonstrated an understanding of the instructions. ?  ?They were advised to call back or seek an in-person evaluation if the symptoms worsen or if the condition fails to improve as anticipated. ? ?Garlan Fair, LCSW ? ? ?  02/12/2022  ? 11:22 AM 01/15/2022  ? 10:55 AM 09/28/2021  ? 10:25 AM 09/11/2021  ?  1:17 PM 05/09/2021  ?  3:27 PM  ?Depression screen PHQ 2/9  ?Decreased Interest 1 0 1 2 0  ?Down, Depressed, Hopeless 1 0 '3 2 1  '$ ?PHQ - 2 Score 2 0 '4 4 1  '$ ?Altered sleeping 0 0 '3 3 2  '$ ?Tired, decreased energy '1 1 3 3 2  '$ ?Change in appetite 0 0 1 2 0  ?Feeling bad or failure about yourself  '3 3 3 3 2  '$ ?Trouble concentrating 3 3 0 0 2  ?Moving slowly or fidgety/restless 0 0 0 0 1  ?  Suicidal thoughts 0 0 1  0  ?PHQ-9 Score '9 7 15 15 10  '$ ?Difficult doing work/chores     Not difficult at all  ? ? ?  02/12/2022  ?  11:24 AM 01/15/2022  ? 10:58 AM 09/28/2021  ? 10:23 AM 05/09/2021  ?  3:28 PM  ?GAD 7 : Generalized Anxiety Score  ?Nervous, Anxious, on Edge '1 1 3 2  '$ ?Control/stop worrying 0 0 0 2  ?Worry too much - different things 2 0 2 3  ?Trouble relaxing 0 0 3 2  ?Restless '3 3 3 2  '$ ?Easily annoyed or irritable 0 '1 1 3  '$ ?Afraid - awful might happen 0 0 0 2  ?Total GAD 7 Score '6 5 12 16  '$ ? ? ? ?

## 2022-03-11 DIAGNOSIS — M47816 Spondylosis without myelopathy or radiculopathy, lumbar region: Secondary | ICD-10-CM | POA: Diagnosis not present

## 2022-03-13 DIAGNOSIS — H903 Sensorineural hearing loss, bilateral: Secondary | ICD-10-CM | POA: Diagnosis not present

## 2022-03-13 DIAGNOSIS — R42 Dizziness and giddiness: Secondary | ICD-10-CM | POA: Diagnosis not present

## 2022-03-20 ENCOUNTER — Ambulatory Visit (INDEPENDENT_AMBULATORY_CARE_PROVIDER_SITE_OTHER): Payer: Medicare Other | Admitting: Clinical

## 2022-03-20 DIAGNOSIS — F411 Generalized anxiety disorder: Secondary | ICD-10-CM

## 2022-03-27 DIAGNOSIS — I10 Essential (primary) hypertension: Secondary | ICD-10-CM | POA: Diagnosis not present

## 2022-03-28 ENCOUNTER — Telehealth: Payer: Self-pay | Admitting: Cardiology

## 2022-03-28 DIAGNOSIS — E569 Vitamin deficiency, unspecified: Secondary | ICD-10-CM | POA: Diagnosis not present

## 2022-03-28 DIAGNOSIS — L659 Nonscarring hair loss, unspecified: Secondary | ICD-10-CM | POA: Diagnosis not present

## 2022-03-28 DIAGNOSIS — R5382 Chronic fatigue, unspecified: Secondary | ICD-10-CM | POA: Diagnosis not present

## 2022-03-28 DIAGNOSIS — G4733 Obstructive sleep apnea (adult) (pediatric): Secondary | ICD-10-CM | POA: Diagnosis not present

## 2022-03-28 DIAGNOSIS — Z01818 Encounter for other preprocedural examination: Secondary | ICD-10-CM | POA: Diagnosis not present

## 2022-03-28 NOTE — Telephone Encounter (Signed)
? ?  Pre-operative Risk Assessment  ?  ?Patient Name: Dynasia Kercheval  ?DOB: Nov 22, 1951 ?MRN: [id number]  ? ?  ? ?Request for Surgical Clearance   ? ?Procedure:   Gastric Sleeve Resection Surgery  ? ?Date of Surgery:  Clearance TBD                              ?   ?Surgeon:  Dr. Gurney Maxin  ?Surgeon's Group or Practice Name:  [facility] ?Phone number:  [phone] ?Fax number:  [phone] ?  ?Type of Clearance Requested:   ?Not indicated ?  ?Type of Anesthesia:  General  ?  ?Additional requests/questions:   ? ?Signed, ?Malanie C Hildebrandt   ?03/28/2022, 4:39 PM  ? ?

## 2022-03-29 ENCOUNTER — Other Ambulatory Visit: Payer: Self-pay | Admitting: General Surgery

## 2022-03-29 ENCOUNTER — Other Ambulatory Visit (HOSPITAL_COMMUNITY): Payer: Self-pay | Admitting: General Surgery

## 2022-03-29 NOTE — Telephone Encounter (Signed)
? ?  Name: Kathryn Compton  ?DOB: 1951-07-18  ?MRN: [id number] ? ?Primary Cardiologist: Carlyle Dolly, MD ? ?Chart reviewed as part of pre-operative protocol coverage. Because of Jumanah Celestin's past medical history and time since last visit, she will require a follow-up in-office visit in order to better assess preoperative cardiovascular risk, due to the type of surgery planned. ? ?Pre-op covering staff: ?- Please schedule appointment and call patient to inform them. If patient already had an upcoming appointment within acceptable timeframe, please add "pre-op clearance" to the appointment notes so provider is aware. ?- Please contact requesting surgeon's office via preferred method (i.e, phone, fax) to inform them of need for appointment prior to surgery. ? ? ? ?Christell Faith, PA-C  ?03/29/2022, 12:50 PM  ? ?

## 2022-03-29 NOTE — Telephone Encounter (Signed)
Spoke with patient and offered appt for next week at the [facility] office for pre-op clearance but patient declined and stated that it does not have to be right away. Scheduled for next available with Dr Harl Bowie in [place] which is 05/10/2022 at 1:40 PM. Will route to requesting surgeons office to make them aware. ?

## 2022-04-01 ENCOUNTER — Other Ambulatory Visit (HOSPITAL_COMMUNITY): Payer: Self-pay | Admitting: General Surgery

## 2022-04-01 ENCOUNTER — Other Ambulatory Visit: Payer: Self-pay | Admitting: General Surgery

## 2022-04-01 DIAGNOSIS — R5382 Chronic fatigue, unspecified: Secondary | ICD-10-CM

## 2022-04-01 DIAGNOSIS — G4733 Obstructive sleep apnea (adult) (pediatric): Secondary | ICD-10-CM

## 2022-04-04 DIAGNOSIS — M47816 Spondylosis without myelopathy or radiculopathy, lumbar region: Secondary | ICD-10-CM | POA: Diagnosis not present

## 2022-04-04 DIAGNOSIS — M47817 Spondylosis without myelopathy or radiculopathy, lumbosacral region: Secondary | ICD-10-CM | POA: Diagnosis not present

## 2022-04-08 DIAGNOSIS — R42 Dizziness and giddiness: Secondary | ICD-10-CM | POA: Diagnosis not present

## 2022-04-10 ENCOUNTER — Ambulatory Visit (HOSPITAL_COMMUNITY)
Admission: RE | Admit: 2022-04-10 | Discharge: 2022-04-10 | Disposition: A | Discharge status: Home / Self Care | Payer: Medicare Other | Source: Ambulatory Visit | Attending: General Surgery | Admitting: General Surgery

## 2022-04-10 DIAGNOSIS — R1310 Dysphagia, unspecified: Secondary | ICD-10-CM | POA: Diagnosis not present

## 2022-04-10 DIAGNOSIS — Z9884 Bariatric surgery status: Secondary | ICD-10-CM | POA: Diagnosis not present

## 2022-04-10 DIAGNOSIS — R5382 Chronic fatigue, unspecified: Secondary | ICD-10-CM

## 2022-04-10 DIAGNOSIS — G4733 Obstructive sleep apnea (adult) (pediatric): Secondary | ICD-10-CM

## 2022-04-10 DIAGNOSIS — I10 Essential (primary) hypertension: Secondary | ICD-10-CM | POA: Diagnosis not present

## 2022-04-15 ENCOUNTER — Ambulatory Visit: Payer: Medicare Other | Admitting: Dietician

## 2022-04-18 DIAGNOSIS — L304 Erythema intertrigo: Secondary | ICD-10-CM | POA: Diagnosis not present

## 2022-04-18 DIAGNOSIS — L738 Other specified follicular disorders: Secondary | ICD-10-CM | POA: Diagnosis not present

## 2022-04-18 DIAGNOSIS — L820 Inflamed seborrheic keratosis: Secondary | ICD-10-CM | POA: Diagnosis not present

## 2022-04-18 NOTE — BH Specialist Note (Signed)
Integrated Behavioral Health via Telemedicine Visit  04/23/2022 [name] [id number]  Number of [place] Clinician visits: Additional Visit  Session Start time: 9528   Session End time: 1120  Total time in minutes: 34   Referring Provider: Darron Doom, MD Patient/Family location: Home [facility] Provider location: [facility] [place] [facility]  All persons participating in visit: [name] and Kathryn Compton   Types of Service: Individual psychotherapy and Telephone visit  I connected with [name] and/or [name] [place]'s  n/a  via  Telephone or Video Enabled Telemedicine Application  (Video is Caregility application) and verified that I am speaking with the correct person using two identifiers. Discussed confidentiality: Yes   I discussed the limitations of telemedicine and the availability of in person appointments.  Discussed there is a possibility of technology failure and discussed alternative modes of communication if that failure occurs.  I discussed that engaging in this telemedicine visit, they consent to the provision of behavioral healthcare and the services will be billed under their insurance.  Patient and/or legal guardian expressed understanding and consented to Telemedicine visit: Yes   Presenting Concerns: Patient and/or family reports the following symptoms/concerns: Worry over troubling health symptoms with no known medical cause at this time (has seen neurology, cardiology, nutritionists to rule out some conditions). Duration of problem: Ongoing; Severity of problem: moderate  Patient and/or Family's Strengths/Protective Factors: Social connections, Concrete supports in place (healthy food, safe environments, etc.), Sense of purpose, and Physical Health (exercise, healthy diet, medication compliance, etc.)  Goals Addressed: Patient will:  Reduce symptoms of: anxiety,  depression, and stress   Demonstrate ability to: Increase healthy adjustment to current life circumstances  Progress towards Goals: Ongoing  Interventions: Interventions utilized:  Solution-Focused Strategies Standardized Assessments completed: Not Needed  Patient and/or Family Response: Patient agrees with treatment plan.   Assessment: Patient currently experiencing Generalized anxiety disorder.   Patient may benefit from continued therapeutic interventions.  Plan: Follow up with behavioral health clinician on : Two weeks Behavioral recommendations:  -Continue self-advocacy within health care system; continue plan to ask PCP to rule out any issues with blood clots (family history), allergies, lyme disease, and/or any other recommendations per PCP -Continue prioritizing healthy self-care during this time (regular meals and sleep; adequate physical activity) Referral(s): [place] (In Clinic)  I discussed the assessment and treatment plan with the patient and/or parent/guardian. They were provided an opportunity to ask questions and all were answered. They agreed with the plan and demonstrated an understanding of the instructions.   They were advised to call back or seek an in-person evaluation if the symptoms worsen or if the condition fails to improve as anticipated.  Caroleen Hamman Deziray Nabi, LCSW     04/19/2022    1:47 PM 02/12/2022   11:22 AM 01/15/2022   10:55 AM 09/28/2021   10:25 AM 09/11/2021    1:17 PM  Depression screen PHQ 2/9  Decreased Interest 0 1 0 1 2  Down, Depressed, Hopeless 0 1 0 3 2  PHQ - 2 Score 0 2 0 4 4  Altered sleeping  0 0 3 3  Tired, decreased energy  '1 1 3 3  '$ Change in appetite  0 0 1 2  Feeling bad or failure about yourself   '3 3 3 3  '$ Trouble concentrating  3 3 0 0  Moving slowly or fidgety/restless  0 0 0 0  Suicidal thoughts  0 0  1   PHQ-9 Score  '9 7 15 15      '$ 02/12/2022   11:24 AM 01/15/2022   10:58 AM 09/28/2021    10:23 AM 05/09/2021    3:28 PM  GAD 7 : Generalized Anxiety Score  Nervous, Anxious, on Edge '1 1 3 2  '$ Control/stop worrying 0 0 0 2  Worry too much - different things 2 0 2 3  Trouble relaxing 0 0 3 2  Restless '3 3 3 2  '$ Easily annoyed or irritable 0 '1 1 3  '$ Afraid - awful might happen 0 0 0 2  Total GAD 7 Score '6 5 12 '$ 16

## 2022-04-19 ENCOUNTER — Encounter: Payer: Medicare Other | Attending: Internal Medicine | Admitting: Dietician

## 2022-04-19 ENCOUNTER — Encounter: Payer: Self-pay | Admitting: Dietician

## 2022-04-19 DIAGNOSIS — Z713 Dietary counseling and surveillance: Secondary | ICD-10-CM | POA: Insufficient documentation

## 2022-04-19 DIAGNOSIS — E569 Vitamin deficiency, unspecified: Secondary | ICD-10-CM | POA: Diagnosis not present

## 2022-04-19 DIAGNOSIS — Z6841 Body Mass Index (BMI) 40.0 and over, adult: Secondary | ICD-10-CM | POA: Insufficient documentation

## 2022-04-19 DIAGNOSIS — R5382 Chronic fatigue, unspecified: Secondary | ICD-10-CM | POA: Diagnosis not present

## 2022-04-19 DIAGNOSIS — E559 Vitamin D deficiency, unspecified: Secondary | ICD-10-CM

## 2022-04-19 DIAGNOSIS — G4733 Obstructive sleep apnea (adult) (pediatric): Secondary | ICD-10-CM | POA: Diagnosis not present

## 2022-04-19 DIAGNOSIS — I10 Essential (primary) hypertension: Secondary | ICD-10-CM | POA: Insufficient documentation

## 2022-04-19 DIAGNOSIS — L659 Nonscarring hair loss, unspecified: Secondary | ICD-10-CM

## 2022-04-19 NOTE — Patient Instructions (Signed)
Gradually reduce caffeine intake by using less coffee or mix with decaf Work on portion control, especially starchy foods such as pasta. Ideally aim for the size of a fisted hand (1 cup) or less.  Plan to have at least a snack or light meal midday

## 2022-04-19 NOTE — Progress Notes (Signed)
Nutrition Assessment for Bariatric Surgery   Appointment start time: 1330   end time: 1430  Planned Surgery: sleeve gastrectomy  Endocrinologist advised  Anthropometrics: Weight: 267.9lbs Height: 5'7" BMI: 41.96   Patient's Goal Weight: 150lbs (BMI would be 23.5)  Clinical: Medical History: hypertension, depression, back and knee pain, sleep apnea, chronic fatigue, vitamin deficiency Medications and Supplements: citalopram, hydrOXYzine, orlistat, propranolol, collagen supplement Relevant labs: 02/06/21 total cholesterol 169, HDL 30, LDL 104, triglycerides 230, HbA1C 5.4% Notable symptoms: back and knee pain, chronic; fatigue Drug allergies: none known Food allergies: none known  Lifestyle and Dietary History:  Dieting/ weight history:  Patient reports lifelong attention to weight, short term dieting periodicaly to lose a few pounds since adolescence.  In more recent years wieght loss became more difficult and dieting has not worked.  Weight has overall increased gradually over multiple years.   Disordered or emotional eating history:  No eating disorder history Some eating when bored; denies stress eating  Physical activity: walking 1 hour 2-3 times a week  Dietary Recall:  Daily pattern: 2 meals and 0-1 snacks. Dining out: 0 meals per week. Breakfast: meal replacement shake with 1-2lg spoons instant coffee   Snack: none Lunch: none Snack: occasionally watermelon; cookie (not a lot of sweets) Supper: usually cooks -- wide variety, pasta, some meats, vegetables Snack: tries to avoid; occasionally yogurt; small cottage cheese and fruit cup Beverages: water, coffee (mixed with meal replacement drink), rarely diet soda   Nutrition Intervention: Instructed patient on pre-op diet goals and importance of close adherence to bariatric diet after surgery to avoid side effects and complications.  Instructed on stages of the bariatric diet after surgery as well as the importance of  adequate protein and fluid intake.  Discussed need for vitamin supplementation after surgery.  Discussed effects of stress on weight per patient questions.  Nutrition Diagnosis: -3.3 Overweight/ obesity related to history of excess calories and inadequate physical activity as evidenced by patient with current BMI of 41.96, working on dietary changes for weight loss prior to bariatric surgery.  Teaching method(s) utilized: Copy provided: Pre-op Goals Diet Stage Template Visit summary with goals/ instructions  Learning Readiness: Ready for change  Barriers to learning/ implementing lifestyle change: none  Demonstrated degree of understanding via: Teach Back  Summary: Patient voices readiness to make diet and lifestyle changes to lose weight and prepare for bariatric surgery.  Patient's goal for weight loss is improved health and energy. She has solid support from friends.  She agrees to work on reducing caffeine intake, reducing food portions, and eating at regular intervals prior to next visit.  She voices understanding of, and is motivated to follow the bariatric diet after surgery.  From a nutrition standpoint, she is ready to proceed with the bariatric surgery program and will be a good candidate for surgery    Plan: Patient commits to returning for weight management visit on 05/08/22 to begin 6 monthly visits. She will plan to attend pre-op class at least 2 weeks prior to surgery.  She will plan to return for post-op RD visits beginning 2 weeks after surgery.

## 2022-04-22 DIAGNOSIS — H903 Sensorineural hearing loss, bilateral: Secondary | ICD-10-CM | POA: Diagnosis not present

## 2022-04-22 DIAGNOSIS — R42 Dizziness and giddiness: Secondary | ICD-10-CM | POA: Diagnosis not present

## 2022-04-23 ENCOUNTER — Ambulatory Visit (INDEPENDENT_AMBULATORY_CARE_PROVIDER_SITE_OTHER): Payer: Medicare Other | Admitting: Clinical

## 2022-04-23 DIAGNOSIS — F411 Generalized anxiety disorder: Secondary | ICD-10-CM

## 2022-04-24 DIAGNOSIS — I10 Essential (primary) hypertension: Secondary | ICD-10-CM | POA: Diagnosis not present

## 2022-04-24 DIAGNOSIS — Z1389 Encounter for screening for other disorder: Secondary | ICD-10-CM | POA: Diagnosis not present

## 2022-04-24 DIAGNOSIS — Z0001 Encounter for general adult medical examination with abnormal findings: Secondary | ICD-10-CM | POA: Diagnosis not present

## 2022-04-24 NOTE — BH Specialist Note (Unsigned)
Integrated Behavioral Health via Telemedicine Visit  05/08/2022 Kathryn Compton [id number]  Number of [place] Clinician visits: Additional Visit  Session Start time: 5053   Session End time: 1122  Total time in minutes: 36   Referring Provider: Darron Doom, MD Kathryn/Family location: [facility] Provider location: [facility] [place] [facility]  All persons participating in visit: Kathryn Compton and [name]   Types of Service: Individual psychotherapy and Telephone visit  I connected with Kathryn Compton and/or Kathryn Compton's  n/a  via  Telephone or Video Enabled Telemedicine Application  (Video is Caregility application) and verified that I am speaking with the correct person using two identifiers. Discussed confidentiality: Yes   I discussed the limitations of telemedicine and the availability of in person appointments.  Discussed there is a possibility of technology failure and discussed alternative modes of communication if that failure occurs.  I discussed that engaging in this telemedicine visit, they consent to the provision of behavioral healthcare and the services will be billed under their insurance.  Kathryn and/or legal guardian expressed understanding and consented to Telemedicine visit: Yes   Presenting Concerns: Kathryn and/or family reports the following symptoms/concerns: "Beating myself up" regarding not knowing how to proceed with next step of life goals.  Duration of problem: Ongoing; Severity of problem: moderate  Kathryn and/or Family's Strengths/Protective Factors: Social connections, Concrete supports in place (healthy food, safe environments, etc.), Sense of purpose, and Physical Health (exercise, healthy diet, medication compliance, etc.)  Goals Addressed: Kathryn will:  Reduce symptoms of: anxiety   Increase knowledge and/or ability of: stress reduction   Demonstrate  ability to: Increase motivation to adhere to plan of care  Progress towards Goals: Ongoing  Interventions: Interventions utilized:  Solution-Focused Strategies Standardized Assessments completed: Not Needed  Kathryn and/or Family Response: Kathryn agrees with treatment plan.   Assessment: Kathryn currently experiencing Generalized anxiety disorder.   Kathryn may benefit from continued therapeutic interventions.  Plan: Follow up with behavioral health clinician on : Two weeks Behavioral recommendations:  -Continue plan to formulate email today; ready to send to specific people by no later than end of this week -Plan one day/week resting from main project (set it aside for that day) Referral(s): Kathryn Compton (In Clinic)  I discussed the assessment and treatment plan with the Kathryn and/or parent/guardian. They were provided an opportunity to ask questions and all were answered. They agreed with the plan and demonstrated an understanding of the instructions.   They were advised to call back or seek an in-person evaluation if the symptoms worsen or if the condition fails to improve as anticipated.  Kathryn Compton Kathryn Leverette, LCSW

## 2022-05-01 ENCOUNTER — Encounter: Payer: Self-pay | Admitting: Dietician

## 2022-05-01 ENCOUNTER — Encounter: Payer: Medicare Other | Admitting: Dietician

## 2022-05-01 DIAGNOSIS — Z713 Dietary counseling and surveillance: Secondary | ICD-10-CM | POA: Diagnosis not present

## 2022-05-01 DIAGNOSIS — L659 Nonscarring hair loss, unspecified: Secondary | ICD-10-CM

## 2022-05-01 DIAGNOSIS — G4733 Obstructive sleep apnea (adult) (pediatric): Secondary | ICD-10-CM | POA: Diagnosis not present

## 2022-05-01 DIAGNOSIS — E569 Vitamin deficiency, unspecified: Secondary | ICD-10-CM | POA: Diagnosis not present

## 2022-05-01 DIAGNOSIS — E559 Vitamin D deficiency, unspecified: Secondary | ICD-10-CM

## 2022-05-01 DIAGNOSIS — I10 Essential (primary) hypertension: Secondary | ICD-10-CM | POA: Diagnosis not present

## 2022-05-01 DIAGNOSIS — R5382 Chronic fatigue, unspecified: Secondary | ICD-10-CM

## 2022-05-01 NOTE — Patient Instructions (Signed)
Focus on light, small meals every 3-4 hours during the day. Low in fat and sugar (ideally <10g of each per serving), moderate in fiber.

## 2022-05-01 NOTE — Progress Notes (Signed)
Supervised Weight Loss for Bariatric Surgery Appt start time: 1100 end time:  1130.  SWL Appointment 1 of 6   Anthropometrics Start Weight at [facility]: 267.9lbs  (04/19/22) Weight: 268.9lbs Height: 5'7"  BMI: 42.2  Clinical Medications: no changes Health/ medical history changes: no changes  Dietary/ Lifestyle Progress: Reports ongoing symptoms of headache (currently for past 3 days), lightheaded/ dizzy, nauseated Changed to different mreal replacement shake due to nausea. Stopped Garden of Life brand, and is now using Secure brand with better results Has more seizure-like episodes recently, since eat test several weeks ago.    Dietary intake: Eating Pattern: 2-43mals and 0 snacks daily Dining out:   Breakfast: meal replacement shake with 1-2 large spoons instant coffee  Snack: none  Lunch: small, few bites of something Snack: none Dinner: lean protein + pasta + veg Snack: occasional yogurt or cottage cheese and fruit Beverages: water, coffee, rarely diet soda  Usual physical activity: none recently due to lightheaded/ dizzy, nausea, headache symptoms    Intervention:   Nutrition counseling for weight loss prior to upcoming bariatric surgery.               Nutritional Diagnosis:  [place]-3.3 Overweight/obesity related to history of excess calories and inadequate physical activity as evidenced by patient with current BMI of 42.2, following dietary guidelines for weight loss prior to bariatric surgery.               Teaching Method Utilized:  Visual Auditory Hands on   Learning Readiness:  Change in progress  Barriers to learning/adherence to lifestyle change: none  Demonstrated degree of understanding via:  Teach Back   Plan:  Eat a light meal or snack every 3-4 hours, consuming foods likely to prevent nausea follow up  05/29/22 at 1:15pm

## 2022-05-07 ENCOUNTER — Ambulatory Visit (INDEPENDENT_AMBULATORY_CARE_PROVIDER_SITE_OTHER): Payer: Medicare Other | Admitting: Clinical

## 2022-05-07 DIAGNOSIS — F411 Generalized anxiety disorder: Secondary | ICD-10-CM | POA: Diagnosis not present

## 2022-05-08 ENCOUNTER — Ambulatory Visit: Payer: Medicare Other | Admitting: Dietician

## 2022-05-10 ENCOUNTER — Ambulatory Visit (INDEPENDENT_AMBULATORY_CARE_PROVIDER_SITE_OTHER): Payer: Medicare Other | Admitting: Cardiology

## 2022-05-10 ENCOUNTER — Encounter: Payer: Self-pay | Admitting: Cardiology

## 2022-05-10 VITALS — BP 146/82 | HR 60 | Ht 67.0 in | Wt 274.0 lb

## 2022-05-10 DIAGNOSIS — R42 Dizziness and giddiness: Secondary | ICD-10-CM | POA: Diagnosis not present

## 2022-05-10 DIAGNOSIS — R002 Palpitations: Secondary | ICD-10-CM

## 2022-05-10 DIAGNOSIS — Z01810 Encounter for preprocedural cardiovascular examination: Secondary | ICD-10-CM

## 2022-05-10 DIAGNOSIS — I10 Essential (primary) hypertension: Secondary | ICD-10-CM | POA: Diagnosis not present

## 2022-05-10 NOTE — Progress Notes (Signed)
Clinical Summary Kathryn Compton is a 71 y.o.female seen today for follow up of the following medical problems.    Palpitations - reports symptoms since her 39s - recently increase in symptoms, though improved on propranolol - mild symptoms at times on propranolol - reports during prior medical testing, was told heart rhythm was variable - 1 cup of coffee a day. No EtoH    - monitor showed symptoms correlated with SR with PVCs and bigeminy, isolated 4 beat run SNVT - PA Leonides Sake had referred for nuclear stress test 10/2021 that showed no ischemia.    - palpitations have resolved.  - she lowered propatnolol to '40mg'$  bid as opposed to tid, no change in symptoms.   - no recent palpitations.     2. Syncope -episode of syncope in Nov 2020 - saw neurology, extensive workup  - occurred while in the bathroom whle getting ready to clean. No prodrome, fell down toward bathtub though was able to partial cath herself. Very brief LOC.  - no recurrence   -11/2021 CTA head/neck: no significant disease - no recent syncope     3. Headaches - in general gets episodes of feeling in neck into left sided of head, change in hearing. Lingers for few hours after. Can at times have some associated palpitations.  - extensive neuro workup previously     4. Dizziness - reports abnormal orthostatic vitals at pcp's office, SBP dropped 30 points - drinks water x 2 bottles. 1 cup of coffee in AM. Rare sodas. Rare body armor.   - followed by ENT, referred for vestibular rehab   5. Preoperative evaluation - considering gastric sleeve resection under general anesthesia 10/2021 nculear stress no ischemia   Past Medical History:  Diagnosis Date   Anxiety    Depression    Hypertension    Motion sickness    Palpitations    Plantar fasciitis    Seizures (HCC)    Vertigo      Allergies  Allergen Reactions   Other Itching and Other (See Comments)    Antidepressants-Itching, Gaining Weight       Current Outpatient Medications  Medication Sig Dispense Refill   citalopram (CELEXA) 40 MG tablet Take 40 mg by mouth daily.     COLLAGEN PO Take by mouth.     famciclovir (FAMVIR) 500 MG tablet Take 500 mg by mouth 3 (three) times daily. Pt takes as needed.     hydrOXYzine (ATARAX/VISTARIL) 25 MG tablet Take 25 mg by mouth 3 (three) times daily as needed. Pt takes 1/4th of a pill if needed     Orlistat (ALLI PO) Take by mouth.     propranolol (INDERAL) 40 MG tablet Take 40 mg by mouth 2 (two) times daily.     No current facility-administered medications for this visit.     Past Surgical History:  Procedure Laterality Date   BREAST REDUCTION SURGERY     COLONOSCOPY N/A 09/06/2019   Procedure: COLONOSCOPY;  Surgeon: Danie Binder, MD;  Location: [facility];  Service: Endoscopy;  Laterality: N/A;  2:00   POLYPECTOMY  09/06/2019   Procedure: POLYPECTOMY;  Surgeon: Danie Binder, MD;  Location: [facility];  Service: Endoscopy;;   REDUCTION MAMMAPLASTY     TUBAL LIGATION       Allergies  Allergen Reactions   Other Itching and Other (See Comments)    Antidepressants-Itching, Gaining Weight       Family History  Problem Relation Age  of Onset   Other Mother        unsure of medical history   Other Father        unknown medical history   Drug abuse Brother    Suicidality Brother    Colon cancer Neg Hx      Social History Ms. Allerton reports that she has never smoked. She has never used smokeless tobacco. Ms. Slider reports that she does not currently use alcohol.   Review of Systems CONSTITUTIONAL: No weight loss, fever, chills, weakness or fatigue.  HEENT: Eyes: No visual loss, blurred vision, double vision or yellow sclerae.No hearing loss, sneezing, congestion, runny nose or sore throat.  SKIN: No rash or itching.  CARDIOVASCULAR: per hpi RESPIRATORY: No shortness of breath, cough or sputum.  GASTROINTESTINAL: No anorexia, nausea, vomiting or  diarrhea. No abdominal pain or blood.  GENITOURINARY: No burning on urination, no polyuria NEUROLOGICAL: No headache, dizziness, syncope, paralysis, ataxia, numbness or tingling in the extremities. No change in bowel or bladder control.  MUSCULOSKELETAL: No muscle, back pain, joint pain or stiffness.  LYMPHATICS: No enlarged nodes. No history of splenectomy.  PSYCHIATRIC: No history of depression or anxiety.  ENDOCRINOLOGIC: No reports of sweating, cold or heat intolerance. No polyuria or polydipsia.  Marland Kitchen   Physical Examination Today's Vitals   05/10/22 1316  BP: (!) 146/82  Pulse: 60  SpO2: 98%  Weight: 274 lb (124.3 kg)  Height: '5\' 7"'$  (1.702 m)   Body mass index is 42.91 kg/m.  Gen: resting comfortably, no acute distress HEENT: no scleral icterus, pupils equal round and reactive, no palptable cervical adenopathy,  CV: RRR, no m/r/g no jvd Resp: Clear to auscultation bilaterally GI: abdomen is soft, non-tender, non-distended, normal bowel sounds, no hepatosplenomegaly MSK: extremities are warm, no edema.  Skin: warm, no rash Neuro:  no focal deficits Psych: appropriate affect   Diagnostic Studies 09/2021 30 day monitor 30 day monitor Min HR 52, Max HR 125, Avg HR 69 Reported symptoms correlated with sinus rhythm with PVCs/bigeminy, 4 beat run of NSVT asymptomatic.     10/2021 nuclear stress   The study is normal. There are no perfusion defects consistent with prior infarct or current ischemia.  The study is low risk.   No ST deviation was noted.   Left ventricular function is normal. Nuclear stress EF: 69 %. The left ventricular ejection fraction is hyperdynamic (>65%). End diastolic cavity size is normal.   11/2021 CTA head and neck CTA neck:   1. The common carotid and internal carotid arteries are patent within the neck without stenosis or significant atherosclerotic disease. 2. The vertebral arteries are patent within the neck without stenosis. Minimal  non-stenotic calcified plaque within the V1 right vertebral artery.   CTA head:   1. No intracranial large vessel occlusion or proximal high-grade arterial stenosis. 2. Mild atherosclerotic disease within the intracranial left ICA and bilateral middle cerebral arteries, as described.    Assessment and Plan  Palpitations -monitor with symptomatic PVCs, doing well on propranolol '40mg'$  bid - continues to do well, continue curren tmeds      2. Orthostatilc hypotension - reports abnormal orthostatics at pcp office -working to increase hydration - mild orthsotatic symptoms at times. BP elevated however with some ongoing orthostatic symptoms would accept higher bp's for her at this time  3. Preoperative evaluation - 10/2021 stress test was normal - ok for bariatric surgery if indicated from cardiac standpoint        Alphonse Guild.  Harl Bowie, M.D.

## 2022-05-10 NOTE — Patient Instructions (Signed)
Medication Instructions:  Your physician recommends that you continue on your current medications as directed. Please refer to the Current Medication list given to you today.   Labwork: None today  Testing/Procedures: None today  Follow-Up: 6 months  Any Other Special Instructions Will Be Listed Below (If Applicable).  If you need a refill on your cardiac medications before your next appointment, please call your pharmacy.  

## 2022-05-13 ENCOUNTER — Ambulatory Visit: Payer: Medicare Other | Admitting: Dietician

## 2022-05-14 NOTE — Telephone Encounter (Signed)
Office called to see if the patient is cleared for surgery. Please advise

## 2022-05-25 DIAGNOSIS — R002 Palpitations: Secondary | ICD-10-CM | POA: Diagnosis not present

## 2022-05-25 DIAGNOSIS — I10 Essential (primary) hypertension: Secondary | ICD-10-CM | POA: Diagnosis not present

## 2022-05-28 NOTE — BH Specialist Note (Deleted)
Integrated Behavioral Health via Telemedicine Visit  05/28/2022 Briannah Lona [id number]  Number of [place] Clinician visits: Additional Visit  Session Start time: 5170   Session End time: 1122  Total time in minutes: 36   Referring Provider: *** Patient/Family location: [facility] Provider location: *** All persons participating in visit: *** Types of Service: {CHL AMB TYPE OF SERVICE:[phone]}  I connected with [name] and/or [name] Hou[facility]'s {family members:20773} via  Telephone or Video Enabled Telemedicine Application  (Video is Caregility application) and verified that I am speaking with the correct person using two identifiers. Discussed confidentiality: {YES/NO:21197}  I discussed the limitations of telemedicine and the availability of in person appointments.  Discussed there is a possibility of technology failure and discussed alternative modes of communication if that failure occurs.  I discussed that engaging in this telemedicine visit, they consent to the provision of behavioral healthcare and the services will be billed under their insurance.  Patient and/or legal guardian expressed understanding and consented to Telemedicine visit: {YES/NO:21197}  Presenting Concerns: Patient and/or family reports the following symptoms/concerns: *** Duration of problem: ***; Severity of problem: {Mild/Moderate/Severe:20260}  Patient and/or Family's Strengths/Protective Factors: {CHL AMB BH PROTECTIVE [phone]}  Goals Addressed: Patient will:  Reduce symptoms of: {IBH Symptoms:21014056}   Increase knowledge and/or ability of: {IBH Patient Tools:21014057}   Demonstrate ability to: {IBH Goals:21014053}  Progress towards Goals: {CHL AMB BH PROGRESS TOWARDS GOALS:[phone]}  Interventions: Interventions utilized:  {IBH Interventions:21014054} Standardized Assessments completed: {IBH Screening Tools:21014051}  Patient and/or  Family Response: ***  Assessment: Patient currently experiencing ***.   Patient may benefit from ***.  Plan: Follow up with behavioral health clinician on : *** Behavioral recommendations: *** Referral(s): {IBH Referrals:21014055}  I discussed the assessment and treatment plan with the patient and/or parent/guardian. They were provided an opportunity to ask questions and all were answered. They agreed with the plan and demonstrated an understanding of the instructions.   They were advised to call back or seek an in-person evaluation if the symptoms worsen or if the condition fails to improve as anticipated.  Caroleen Hamman Rashmi Tallent, LCSW

## 2022-05-29 ENCOUNTER — Encounter: Payer: Self-pay | Admitting: Dietician

## 2022-05-29 ENCOUNTER — Encounter: Payer: Medicare Other | Attending: General Surgery | Admitting: Dietician

## 2022-05-29 DIAGNOSIS — Z713 Dietary counseling and surveillance: Secondary | ICD-10-CM | POA: Insufficient documentation

## 2022-05-29 DIAGNOSIS — Z6841 Body Mass Index (BMI) 40.0 and over, adult: Secondary | ICD-10-CM | POA: Diagnosis not present

## 2022-05-29 DIAGNOSIS — I10 Essential (primary) hypertension: Secondary | ICD-10-CM | POA: Insufficient documentation

## 2022-05-29 DIAGNOSIS — R5382 Chronic fatigue, unspecified: Secondary | ICD-10-CM | POA: Insufficient documentation

## 2022-05-29 DIAGNOSIS — E559 Vitamin D deficiency, unspecified: Secondary | ICD-10-CM | POA: Insufficient documentation

## 2022-05-29 DIAGNOSIS — L659 Nonscarring hair loss, unspecified: Secondary | ICD-10-CM | POA: Insufficient documentation

## 2022-05-29 DIAGNOSIS — G4733 Obstructive sleep apnea (adult) (pediatric): Secondary | ICD-10-CM | POA: Insufficient documentation

## 2022-05-29 NOTE — Progress Notes (Signed)
Supervised Weight Loss for Bariatric Surgery Appt start time: 1315 end time:  1350  SWL Appointment 2 of 6   Anthropometrics Start Weight at [facility]: 267.9lbs  (04/19/22) Weight: 271.0lbs Height: 5'7"  BMI: 42.44  Clinical Medications: no changes since previous visit Health/ medical history changes: no changes  Dietary/ Lifestyle Progress: Patient reports improved headache/ dizzy/ nausea symptoms   Dietary intake: Eating Pattern: 3 meals and 1 snacks daily Dining out:   Breakfast: meal replacement shake with 1-2 spoonfuls instant coffee  Snack: none  Lunch: smoothie with fruit, milk, plain yogurt, beet elite powder, hydroxycut powder Snack: none Dinner: lean protein + veg + pasta/ starch -- very low carb bread (chompie's brand, 2g carb per slice) Snack: occ yogurt; likes cottage cheese and will try with fresh fruit vs commercial puree with added sugar Beverages: water, coffee, protein drinks/ smoothie; flavored sparkling, rarely diet soda maybe 1-2 per month  Usual physical activity: equilibrium PT starting 06/2022    Intervention:   Nutrition counseling for weight loss prior to upcoming bariatric surgery.               Nutritional Diagnosis:  [place]-3.3 Overweight/obesity related to history of excess calories and inadequate physical activity as evidenced by patient with current BMI of 42.44, following dietary guidelines for weight loss prior to bariatric surgery.               Teaching Method Utilized:  Visual Auditory    Learning Readiness:  Change in progress  Barriers to learning/adherence to lifestyle change: none  Demonstrated degree of understanding via:  Teach Back   Plan:   follow up 06/26/22 at 1:30pm

## 2022-05-30 DIAGNOSIS — I10 Essential (primary) hypertension: Secondary | ICD-10-CM | POA: Diagnosis not present

## 2022-05-30 DIAGNOSIS — Z0001 Encounter for general adult medical examination with abnormal findings: Secondary | ICD-10-CM | POA: Diagnosis not present

## 2022-05-30 DIAGNOSIS — Z1159 Encounter for screening for other viral diseases: Secondary | ICD-10-CM | POA: Diagnosis not present

## 2022-06-04 NOTE — Therapy (Signed)
OUTPATIENT PHYSICAL THERAPY VESTIBULAR EVALUATION     Patient Name: Kathryn Compton MRN: [id number] DOB:12/15/1950, 71 y.o., female Today's Date: 06/05/2022  PCP:   Carrolyn Meiers, MD   REFERRING PROVIDER: Raylene Miyamoto   PT End of Session - 06/05/22 1310     Visit Number 1    Number of Visits 6    Date for PT Re-Evaluation 06/26/22    Authorization Type [employer] Medicare    Progress Note Due on Visit 10    PT Start Time 1307    PT Stop Time 0145    PT Time Calculation (min) 758 min             Past Medical History:  Diagnosis Date   Anxiety    Depression    Hypertension    Motion sickness    Palpitations    Plantar fasciitis    Seizures ([place])    Vertigo    Past Surgical History:  Procedure Laterality Date   BREAST REDUCTION SURGERY     COLONOSCOPY N/A 09/06/2019   Procedure: COLONOSCOPY;  Surgeon: Danie Binder, MD;  [facility];  Service: Endoscopy;  Laterality: N/A;  2:00   POLYPECTOMY  09/06/2019   Procedure: POLYPECTOMY;  Surgeon: Danie Binder, MD;  Location: [facility];  Service: Endoscopy;;   REDUCTION MAMMAPLASTY     TUBAL LIGATION     Patient Active Problem List   Diagnosis Date Noted   Morbid obesity ([place]) 07/31/2021   Pressure in head 06/19/2021   Essential hypertension 11/09/2020   Hip pain 11/09/2020   Papanicolaou smear of cervix with positive high risk human papilloma virus (HPV) test 08/01/2020   Encounter for screening fecal occult blood testing 08/01/2020   Routine medical exam 08/01/2020   Encounter for gynecological examination with Papanicolaou smear of cervix 08/01/2020   Abnormal gait 07/27/2020   Difficulty balancing 07/27/2020   Dizziness 07/27/2020   Sleep apnea 07/27/2020   Insomnia 06/01/2020   Obstructive sleep apnea of adult 06/01/2020   Vitamin D deficiency 05/31/2020   Anxiety disorder 04/20/2020   Palpitations 04/20/2020   Sacroiliac joint pain 04/20/2020   Seizure ([place]) 04/20/2020    Syncope and collapse 04/20/2020   Special screening for malignant neoplasms, colon     ONSET DATE: has been ongoing since she was child  REFERRING DIAG: PT eval/tx for dizziness per Raylene Miyamoto, MD   THERAPY DIAG:  Dizziness and giddiness  Rationale for Evaluation and Treatment Rehabilitation  SUBJECTIVE:   SUBJECTIVE STATEMENT: Patient reports issues with dizziness with movement ever since she was a child. States that motion sickness runs in her family; she got sick on swings when she was little. She saw PA at Dr. Elio Forget office; diagnosed with seizures. But got second opinion; no seizure activity per second opinion. Saw cardiologist; lots of tests such ECG, stress test, normal testing  carotid or vertebral artery. Rotary chair Testing done by [employer]; negative for inner ear. Suggested possible hypersensitive vestibular system? . She states sometimes getting in and out of the car causes a sensation in back of her head that turns into dizziness episode.  Reports positive orthostatic hypotension test in past.    Pt accompanied by: self  PERTINENT HISTORY:    PAIN:  Are you having pain? No  PRECAUTIONS: None  WEIGHT BEARING RESTRICTIONS No  FALLS: Has patient fallen in last 6 months? No  PLOF: Independent  PATIENT GOALS less dizziness  OBJECTIVE:   DIAGNOSTIC FINDINGS:  FINDINGS: CT HEAD FINDINGS   Brain:   Cerebral volume is normal.   There is no acute intracranial hemorrhage.   No demarcated cortical infarct.   No extra-axial fluid collection.   No evidence of an intracranial mass.   No midline shift.   Vascular: No hyperdense vessel.   Skull: Normal. Negative for fracture or focal lesion.   Sinuses: No significant paranasal sinus disease.   Orbits: No acute or significant orbital finding.   Review of the MIP images confirms the above findings   CTA NECK FINDINGS   Aortic arch: Standard aortic branching. The visualized aortic arch is normal in  caliber. Minimal atherosclerotic plaque within the proximal major branch vessels of the neck. No hemodynamically significant innominate or proximal subclavian artery stenosis.   Right carotid system: CCA and ICA patent within the neck without stenosis or significant atherosclerotic disease. Partially retropharyngeal course of the ICA.   Left carotid system: CCA and ICA patent within the neck without stenosis or significant atherosclerotic disease. Partially retropharyngeal course of the ICA.   Vertebral arteries: Codominant and patent within the neck without stenosis. Minimal nonstenotic calcified plaque within the V1 right vertebral artery.   Skeleton: Cervical spondylosis. Bilateral C2-C3 facet joint ankylosis. No acute bony abnormality or aggressive osseous lesion.   Other neck: No neck mass or cervical lymphadenopathy. Thyroid unremarkable.   Upper chest: Respiratory motion artifact limits evaluation of the imaged lung apices. No appreciable consolidation within the imaged lung apices.   Review of the MIP images confirms the above findings   CTA HEAD FINDINGS   Anterior circulation:   The intracranial internal carotid arteries are patent. Minimal nonstenotic calcified plaque within the cavernous left ICA. The M1 middle cerebral arteries are patent. Mild atherosclerotic irregularity of the M2 and more distal middle cerebral artery vessels, bilaterally. No M2 proximal branch occlusion or high-grade proximal stenosis is identified. The anterior cerebral arteries are patent. No intracranial aneurysm is identified.   Posterior circulation:   The intracranial vertebral arteries are patent. The basilar artery is patent. The posterior cerebral arteries are patent. Fetal origin right posterior cerebral artery. The left posterior communicating artery is diminutive or absent.   Venous sinuses: Within the limitations of contrast timing, no convincing thrombus.   Anatomic  variants: As described.   Review of the MIP images confirms the above findings   IMPRESSION: CT head:   Unremarkable non-contrast CT appearance of the brain. No evidence of acute intracranial abnormality.   CTA neck:   1. The common carotid and internal carotid arteries are patent within the neck without stenosis or significant atherosclerotic disease. 2. The vertebral arteries are patent within the neck without stenosis. Minimal non-stenotic calcified plaque within the V1 right vertebral artery.   CTA head:   1. No intracranial large vessel occlusion or proximal high-grade arterial stenosis. 2. Mild atherosclerotic disease within the intracranial left ICA and bilateral middle cerebral arteries, as described.    EXAM: MRI HEAD WITHOUT AND WITH CONTRAST   TECHNIQUE: Multiplanar, multiecho pulse sequences of the brain and surrounding structures were obtained without and with intravenous contrast.   CONTRAST:  48m GADAVIST GADOBUTROL 1 MMOL/ML IV SOLN   COMPARISON:  None.   FINDINGS: Brain: No acute or subacute infarction, hemorrhage, hydrocephalus, extra-axial collection or mass lesion. No cortical finding to explain seizure. Few remote white matter insults, less than typical for age. Normal brain volume.   Vascular: Normal flow voids, including vertebrobasilar.   Skull and upper cervical spine: Normal  marrow signal   Sinuses/Orbits: Mild mucosal thickening in the right sphenoid sinus.   IMPRESSION: No explanation for symptoms.     COGNITION: Overall cognitive status: Within functional limits for tasks assessed   SENSATION:no numbness or tingling reported    Cervical ROM:  WFL without issue   BED MOBILITY:  Sit to supine Modified independence Supine to sit Modified independence  TRANSFERS: Assistive device utilized: None  Sit to stand: Modified independence Stand to sit: Modified independence Chair to chair: Modified independence PATIENT  SURVEYS:  DHI 14   VESTIBULAR ASSESSMENT   GENERAL OBSERVATION: wears glasses    SYMPTOM BEHAVIOR:   Subjective history: dizziness of unknown origin; no specific cause   Non-Vestibular symptoms: neck pain   Type of dizziness: Imbalance (Disequilibrium), Spinning/Vertigo, and Unsteady with head/body turns   Frequency: varies   Duration: varies   Aggravating factors: No known aggravating factors   Relieving factors: no known relieving factors   Progression of symptoms: unchanged   OCULOMOTOR EXAM:   Ocular Alignment: normal   Ocular ROM: No Limitations   Spontaneous Nystagmus: absent   Gaze-Induced Nystagmus: absent   Smooth Pursuits: intact   Saccades: intact      VESTIBULAR - OCULAR REFLEX:    Slow VOR: Normal     Head-Impulse Test: HIT Right: negative HIT Left: negative         MOTION SENSITIVITY:    Motion Sensitivity Quotient  Intensity: 0 = none, 1 = Lightheaded, 2 = Mild, 3 = Moderate, 4 = Severe, 5 = Vomiting  Intensity  1. Sitting to supine 2  2. Supine to L side   3. Supine to R side   4. Supine to sitting 0  5. L Hallpike-Dix   6. Up from L    7. R Hallpike-Dix   8. Up from R    9. Sitting, head  tipped to L knee   10. Head up from L  knee 2  11. Sitting, head  tipped to R knee   12. Head up from R  knee 2  13. Sitting head turns x5 2  14.Sitting head nods x5 2  15. In stance, 180  turn to L    16. In stance, 180  turn to R     OTHOSTATICS: not done but patient reports HBP usually and has been checked before with positive orthostatic BP issues   VESTIBULAR TREATMENT:  Canalith Repositioning:not performed    Gaze Adaptation:   x1 Viewing Horizontal: Position: sitting, Time: 3, and Reps: 10 and x1 Viewing Vertical:  Position: sitting, Time: 3, and Reps: 10  PATIENT EDUCATION:  Education details: Patient educated on exam findings, POC, scope of PT, HEP, and types of vertigo/causes. Person educated: Patient Education method:  Explanation, Demonstration, and Handouts Education comprehension: verbalized understanding, returned demonstration, verbal cues required, and tactile cues required  GOALS: Goals reviewed with patient? No  SHORT TERM GOALS: Target date: 06/19/2022    patient will be independent with initial HEP Baseline: Goal status: INITIAL   LONG TERM GOALS: Target date: 06/26/2022    Patient will be independent in self management strategies to improve quality of life and functional outcomes.  Baseline:  Goal status: INITIAL  2.  Patient will report at least 50% improvement in overall symptoms and/or function to demonstrate improved functional mobility  Baseline:  Goal status: INITIAL  3.  Patient will improve her score on the [place] to 12 or less to demonstrate decreased handicap due to dizziness Baseline:  14 Goal status: INITIAL  4.  Patient will perform bed mobility and sit to stand without complaint of dizziness to improve tolerance for getting out of bed and standing up to start her day more efficiently.  Baseline:  Goal status: INITIAL    ASSESSMENT:  CLINICAL IMPRESSION: Patient is a 71 y.o. lady who was seen today for physical therapy evaluation and treatment for dizziness. She has seen multiple physicians, cardiology, ENT, neurology with no understanding of what could be causing her symptoms as of yet. Patient demonstrates no nystagmus today with any bed mobility or gaze.  Has good VOR and motor control. Appears to need habituation exercises but will continue with testing next visit.    OBJECTIVE IMPAIRMENTS decreased activity tolerance, decreased balance, decreased knowledge of condition, decreased mobility, dizziness, impaired perceived functional ability, and pain.   ACTIVITY LIMITATIONS carrying, lifting, bending, sitting, standing, squatting, sleeping, stairs, transfers, bed mobility, and locomotion level  PARTICIPATION LIMITATIONS: meal prep, cleaning, laundry, driving,  shopping, and community activity  PERSONAL FACTORS Past/current experiences and Time since onset of injury/illness/exacerbation are also affecting patient's functional outcome.   REHAB POTENTIAL: Good  CLINICAL DECISION MAKING: Evolving/moderate complexity  EVALUATION COMPLEXITY: Moderate   PLAN: PT FREQUENCY: 2x/week  PT DURATION: 3 weeks   PLANNED INTERVENTIONS: Therapeutic exercises, Therapeutic activity, Neuromuscular re-education, Balance training, Gait training, Patient/Family education, Joint manipulation, Joint mobilization, Stair training, Orthotic/Fit training, DME instructions, Aquatic Therapy, Dry Needling, Electrical stimulation, Spinal manipulation, Spinal mobilization, Cryotherapy, Moist heat, Compression bandaging, scar mobilization, Splintting, Taping, Traction, Ultrasound, Ionotophoresis '4mg'$ /ml Dexamethasone, and Manual therapy   PLAN FOR NEXT SESSION: Review HEP and goals; progress habituation exercises, DGI, orthostatic hypotension testing, positional testing if needed.    2:59 PM, 06/05/22 Kathryn Compton Small Shamya Macfadden MPT [facility] [place] [phone]

## 2022-06-05 ENCOUNTER — Ambulatory Visit: Payer: Medicare Other | Admitting: Dietician

## 2022-06-05 ENCOUNTER — Ambulatory Visit (HOSPITAL_COMMUNITY): Payer: Medicare Other | Attending: Otolaryngology

## 2022-06-05 DIAGNOSIS — R42 Dizziness and giddiness: Secondary | ICD-10-CM | POA: Insufficient documentation

## 2022-06-10 ENCOUNTER — Ambulatory Visit: Payer: Medicare Other | Admitting: Dietician

## 2022-06-10 DIAGNOSIS — S8391XA Sprain of unspecified site of right knee, initial encounter: Secondary | ICD-10-CM | POA: Diagnosis not present

## 2022-06-12 NOTE — BH Specialist Note (Signed)
Integrated Behavioral Health via Telemedicine Visit  06/19/2022 Kathryn Compton [id number]  Number of [place] Clinician visits: Additional Visit  Session Start time: [phone]   Session End time: 1024  Total time in minutes: 38   Referring Provider: Darron Doom, MD Kathryn/Family location: Home [facility] Provider location: [facility] [place] [facility]  All persons participating in visit: Kathryn Compton and [place]   Types of Service: Individual psychotherapy and Telephone visit  I connected with [name] and/or [name] [place]rough's  n/a  via  Telephone or Video Enabled Telemedicine Application  (Video is Caregility application) and verified that I am speaking with the correct person using two identifiers. Discussed confidentiality: Yes   I discussed the limitations of telemedicine and the availability of in person appointments.  Discussed there is a possibility of technology failure and discussed alternative modes of communication if that failure occurs.  I discussed that engaging in this telemedicine visit, they consent to the provision of behavioral healthcare and the services will be billed under their insurance.  Kathryn and/or legal guardian expressed understanding and consented to Telemedicine visit: Yes   Presenting Concerns: Kathryn and/or family reports the following symptoms/concerns: Continued stress over preparing screenplay for submission, along with request for crochet project with an urgent deadline; finding that crochet is a therapeutic outlet.  Duration of problem: Ongoing; Severity of problem: moderate  Kathryn and/or Family's Strengths/Protective Factors: Social connections, Concrete supports in place (healthy food, safe environments, etc.), Sense of purpose, and Physical Health (exercise, healthy diet, medication compliance, etc.)  Goals Addressed: Kathryn will:  Reduce symptoms of:  anxiety   Increase knowledge and/or ability of: stress reduction   Progress towards Goals: Ongoing  Interventions: Interventions utilized:  Solution-Focused Strategies Standardized Assessments completed: Not Needed  Kathryn and/or Family Response: Kathryn agrees with treatment plan.   Assessment: Kathryn currently experiencing Generalized anxiety disorder.   Kathryn may benefit from continued therapeutic interventions.  Plan: Follow up with behavioral health clinician on : Two weeks Behavioral recommendations:  -Continue updating screenplay for contest to submit (by September deadline) -Continue additional screenplay submissions for consideration -Continue with crochet project; consider options for selling in the future ([employer], [employer], [employer], etc) -Consider legal advice for copyrighting screenplay at [email or web address]  Referral(s): [place] (In Clinic)  I discussed the assessment and treatment plan with the Kathryn and/or parent/guardian. They were provided an opportunity to ask questions and all were answered. They agreed with the plan and demonstrated an understanding of the instructions.   They were advised to call back or seek an in-person evaluation if the symptoms worsen or if the condition fails to improve as anticipated.  Caroleen Hamman Tray Klayman, LCSW     04/19/2022    1:47 PM 02/12/2022   11:22 AM 01/15/2022   10:55 AM 09/28/2021   10:25 AM 09/11/2021    1:17 PM  Depression screen PHQ 2/9  Decreased Interest 0 1 0 1 2  Down, Depressed, Hopeless 0 1 0 3 2  PHQ - 2 Score 0 2 0 4 4  Altered sleeping  0 0 3 3  Tired, decreased energy  '1 1 3 3  '$ Change in appetite  0 0 1 2  Feeling bad or failure about yourself   '3 3 3 3  '$ Trouble concentrating  3 3 0 0  Moving slowly or fidgety/restless  0 0 0 0  Suicidal thoughts  0 0 1   PHQ-9 Score  $'9 7 15 15      'z$ 02/12/2022   11:24 AM 01/15/2022   10:58 AM 09/28/2021   10:23 AM  05/09/2021    3:28 PM  GAD 7 : Generalized Anxiety Score  Nervous, Anxious, on Edge '1 1 3 2  '$ Control/stop worrying 0 0 0 2  Worry too much - different things 2 0 2 3  Trouble relaxing 0 0 3 2  Restless '3 3 3 2  '$ Easily annoyed or irritable 0 '1 1 3  '$ Afraid - awful might happen 0 0 0 2  Total GAD 7 Score '6 5 12 '$ 16

## 2022-06-18 ENCOUNTER — Ambulatory Visit (HOSPITAL_COMMUNITY): Payer: Medicare Other | Attending: Internal Medicine

## 2022-06-18 DIAGNOSIS — G8929 Other chronic pain: Secondary | ICD-10-CM | POA: Insufficient documentation

## 2022-06-18 DIAGNOSIS — M533 Sacrococcygeal disorders, not elsewhere classified: Secondary | ICD-10-CM | POA: Insufficient documentation

## 2022-06-18 DIAGNOSIS — R42 Dizziness and giddiness: Secondary | ICD-10-CM | POA: Insufficient documentation

## 2022-06-18 DIAGNOSIS — M545 Low back pain, unspecified: Secondary | ICD-10-CM | POA: Insufficient documentation

## 2022-06-18 NOTE — Therapy (Signed)
OUTPATIENT PHYSICAL THERAPY VESTIBULAR TREATMENT     Patient Name: Kathryn Compton MRN: [id number] DOB:12-Nov-1951, 71 y.o., female Today's Date: 06/18/2022  PCP:   Carrolyn Meiers, MD   REFERRING PROVIDER: Raylene Miyamoto   PT End of Session - 06/18/22 1521     Visit Number 2    Number of Visits 6    Date for PT Re-Evaluation 06/26/22    Authorization Type [employer]    Progress Note Due on Visit 10    PT Start Time 1520    PT Stop Time 1600    PT Time Calculation (min) 40 min              Past Medical History:  Diagnosis Date   Anxiety    Depression    Hypertension    Motion sickness    Palpitations    Plantar fasciitis    Seizures ([place])    Vertigo    Past Surgical History:  Procedure Laterality Date   BREAST REDUCTION SURGERY     COLONOSCOPY N/A 09/06/2019   Procedure: COLONOSCOPY;  Surgeon: Danie Binder, MD;  [facility]: [facility];  Service: Endoscopy;  Laterality: N/A;  2:00   POLYPECTOMY  09/06/2019   Procedure: POLYPECTOMY;  Surgeon: Danie Binder, MD;  Location: [facility];  Service: Endoscopy;;   REDUCTION MAMMAPLASTY     TUBAL LIGATION     Patient Active Problem List   Diagnosis Date Noted   Morbid obesity ([place]) 07/31/2021   Pressure in head 06/19/2021   Essential hypertension 11/09/2020   Hip pain 11/09/2020   Papanicolaou smear of cervix with positive high risk human papilloma virus (HPV) test 08/01/2020   Encounter for screening fecal occult blood testing 08/01/2020   Routine medical exam 08/01/2020   Encounter for gynecological examination with Papanicolaou smear of cervix 08/01/2020   Abnormal gait 07/27/2020   Difficulty balancing 07/27/2020   Dizziness 07/27/2020   Sleep apnea 07/27/2020   Insomnia 06/01/2020   Obstructive sleep apnea of adult 06/01/2020   Vitamin D deficiency 05/31/2020   Anxiety disorder 04/20/2020   Palpitations 04/20/2020   Sacroiliac joint pain 04/20/2020   Seizure ([place]) 04/20/2020    Syncope and collapse 04/20/2020   Special screening for malignant neoplasms, colon     ONSET DATE: has been ongoing since she was child  REFERRING DIAG: PT eval/tx for dizziness per Raylene Miyamoto, MD   THERAPY DIAG:  Dizziness and giddiness  Sacroiliac joint pain  Chronic midline low back pain without sciatica  Rationale for Evaluation and Treatment Rehabilitation  SUBJECTIVE:   SUBJECTIVE STATEMENT: Not much change since last visit.  She has been crocheting for the past 2 weeks for several hours a day and had a episode yesterday.  Tightness in back of neck an then felt lightheaded and dizzy; pressure in head.    Pt accompanied by: self  PERTINENT HISTORY:    PAIN:  Are you having pain? No  PRECAUTIONS: None  WEIGHT BEARING RESTRICTIONS No  FALLS: Has patient fallen in last 6 months? No  PLOF: Independent  PATIENT GOALS less dizziness  OBJECTIVE:   DIAGNOSTIC FINDINGS:   FINDINGS: CT HEAD FINDINGS   Brain:   Cerebral volume is normal.   There is no acute intracranial hemorrhage.   No demarcated cortical infarct.   No extra-axial fluid collection.   No evidence of an intracranial mass.   No midline shift.   Vascular: No hyperdense vessel.   Skull: Normal. Negative for  fracture or focal lesion.   Sinuses: No significant paranasal sinus disease.   Orbits: No acute or significant orbital finding.   Review of the MIP images confirms the above findings   CTA NECK FINDINGS   Aortic arch: Standard aortic branching. The visualized aortic arch is normal in caliber. Minimal atherosclerotic plaque within the proximal major branch vessels of the neck. No hemodynamically significant innominate or proximal subclavian artery stenosis.   Right carotid system: CCA and ICA patent within the neck without stenosis or significant atherosclerotic disease. Partially retropharyngeal course of the ICA.   Left carotid system: CCA and ICA patent within the neck  without stenosis or significant atherosclerotic disease. Partially retropharyngeal course of the ICA.   Vertebral arteries: Codominant and patent within the neck without stenosis. Minimal nonstenotic calcified plaque within the V1 right vertebral artery.   Skeleton: Cervical spondylosis. Bilateral C2-C3 facet joint ankylosis. No acute bony abnormality or aggressive osseous lesion.   Other neck: No neck mass or cervical lymphadenopathy. Thyroid unremarkable.   Upper chest: Respiratory motion artifact limits evaluation of the imaged lung apices. No appreciable consolidation within the imaged lung apices.   Review of the MIP images confirms the above findings   CTA HEAD FINDINGS   Anterior circulation:   The intracranial internal carotid arteries are patent. Minimal nonstenotic calcified plaque within the cavernous left ICA. The M1 middle cerebral arteries are patent. Mild atherosclerotic irregularity of the M2 and more distal middle cerebral artery vessels, bilaterally. No M2 proximal branch occlusion or high-grade proximal stenosis is identified. The anterior cerebral arteries are patent. No intracranial aneurysm is identified.   Posterior circulation:   The intracranial vertebral arteries are patent. The basilar artery is patent. The posterior cerebral arteries are patent. Fetal origin right posterior cerebral artery. The left posterior communicating artery is diminutive or absent.   Venous sinuses: Within the limitations of contrast timing, no convincing thrombus.   Anatomic variants: As described.   Review of the MIP images confirms the above findings   IMPRESSION: CT head:   Unremarkable non-contrast CT appearance of the brain. No evidence of acute intracranial abnormality.   CTA neck:   1. The common carotid and internal carotid arteries are patent within the neck without stenosis or significant atherosclerotic disease. 2. The vertebral arteries are patent  within the neck without stenosis. Minimal non-stenotic calcified plaque within the V1 right vertebral artery.   CTA head:   1. No intracranial large vessel occlusion or proximal high-grade arterial stenosis. 2. Mild atherosclerotic disease within the intracranial left ICA and bilateral middle cerebral arteries, as described.    EXAM: MRI HEAD WITHOUT AND WITH CONTRAST   TECHNIQUE: Multiplanar, multiecho pulse sequences of the brain and surrounding structures were obtained without and with intravenous contrast.   CONTRAST:  78m GADAVIST GADOBUTROL 1 MMOL/ML IV SOLN   COMPARISON:  None.   FINDINGS: Brain: No acute or subacute infarction, hemorrhage, hydrocephalus, extra-axial collection or mass lesion. No cortical finding to explain seizure. Few remote white matter insults, less than typical for age. Normal brain volume.   Vascular: Normal flow voids, including vertebrobasilar.   Skull and upper cervical spine: Normal marrow signal   Sinuses/Orbits: Mild mucosal thickening in the right sphenoid sinus.   IMPRESSION: No explanation for symptoms.     COGNITION: Overall cognitive status: Within functional limits for tasks assessed   SENSATION:no numbness or tingling reported    Cervical ROM:  WFL without issue   BED MOBILITY:  Sit to supine  Modified independence Supine to sit Modified independence  TRANSFERS: Assistive device utilized: None  Sit to stand: Modified independence Stand to sit: Modified independence Chair to chair: Modified independence PATIENT SURVEYS:  DHI 14   VESTIBULAR ASSESSMENT   GENERAL OBSERVATION: wears glasses    SYMPTOM BEHAVIOR:   Subjective history: dizziness of unknown origin; no specific cause   Non-Vestibular symptoms: neck pain   Type of dizziness: Imbalance (Disequilibrium), Spinning/Vertigo, and Unsteady with head/body turns   Frequency: varies   Duration: varies   Aggravating factors: No known aggravating  factors   Relieving factors: no known relieving factors   Progression of symptoms: unchanged   OCULOMOTOR EXAM:   Ocular Alignment: normal   Ocular ROM: No Limitations   Spontaneous Nystagmus: absent   Gaze-Induced Nystagmus: absent   Smooth Pursuits: intact   Saccades: intact      VESTIBULAR - OCULAR REFLEX:    Slow VOR: Normal     Head-Impulse Test: HIT Right: negative HIT Left: negative         MOTION SENSITIVITY:    Motion Sensitivity Quotient  Intensity: 0 = none, 1 = Lightheaded, 2 = Mild, 3 = Moderate, 4 = Severe, 5 = Vomiting  Intensity  1. Sitting to supine 2  2. Supine to L side   3. Supine to R side   4. Supine to sitting 0  5. L Hallpike-Dix   6. Up from L    7. R Hallpike-Dix   8. Up from R    9. Sitting, head  tipped to L knee   10. Head up from L  knee 2  11. Sitting, head  tipped to R knee   12. Head up from R  knee 2  13. Sitting head turns x5 2  14.Sitting head nods x5 2  15. In stance, 180  turn to L    16. In stance, 180  turn to R     OTHOSTATICS: not done but patient reports HBP usually and has been checked before with positive orthostatic BP issues   VESTIBULAR TREATMENT:  06/18/22 Review of HEP and goals  DGI 1. Gait level surface (3) Normal: Walks 20', no assistive devices, good sped, no evidence for imbalance, normal gait pattern 2. Change in gait speed (3) Normal: Able to smoothly change walking speed without loss of balance or gait deviation. Shows a significant difference in walking speeds between normal, fast and slow speeds. 3. Gait with horizontal head turns (3) Normal: Performs head turns smoothly with no change in gait. 4. Gait with vertical head turns (3) Normal: Performs head turns smoothly with no change in gait. 5. Gait and pivot turn (3) Normal: Pivot turns safely within 3 seconds and stops quickly with no loss of balance. 6. Step over obstacle (2) Mild Impairment: Is able to step over box, but must slow down  and adjust steps to clear box safely. 7. Step around obstacles (3) Normal: Is able to walk around cones safely without changing gait speed; no evidence of imbalance. 8. Stairs (3) Normal: Alternating feet, no rail.  TOTAL SCORE: 23 / 24  BP sitting 132/78; standing 128/78  Sidelying test; lightheaded on the Right but no nystagmus or vertigo  Brandt daroff exercise     Canal[place] Repositioning:not performed    Gaze Adaptation:   x1 Viewing Horizontal: Position: sitting, Time: 3, and Reps: 10 and x1 Viewing Vertical:  Position: sitting, Time: 3, and Reps: 10  PATIENT EDUCATION:  Education details: Patient educated on  exam findings, POC, scope of PT, HEP, and types of vertigo/causes. Person educated: Patient Education method: Explanation, Demonstration, and Handouts Education comprehension: verbalized understanding, returned demonstration, verbal cues required, and tactile cues required  GOALS: Goals reviewed with patient? No  SHORT TERM GOALS: Target date: 06/19/2022    patient will be independent with initial HEP Baseline: Goal status: ongoing   LONG TERM GOALS: Target date: 06/26/2022    Patient will be independent in self management strategies to improve quality of life and functional outcomes.  Baseline:  Goal status: ongoing  2.  Patient will report at least 50% improvement in overall symptoms and/or function to demonstrate improved functional mobility  Baseline:  Goal status: ongoing  3.  Patient will improve her score on the [place] to 12 or less to demonstrate decreased handicap due to dizziness Baseline: 14 Goal status: ongoing 4.  Patient will perform bed mobility and sit to stand without complaint of dizziness to improve tolerance for getting out of bed and standing up to start her day more efficiently.  Baseline:  Goal status: ongoing    ASSESSMENT:  CLINICAL IMPRESSION: Today's session started with review of HEP and goals set at eval.  Patient  verbalizes agreement with HEP. DGI performed 23/24 score. Side lying test today patient without nystagmus but some lightheadedness to the right. Added Brandt daroff to HEP. Patient will benefit from continued skilled therapy interventions to address deficits and improve functional mobility.    OBJECTIVE IMPAIRMENTS decreased activity tolerance, decreased balance, decreased knowledge of condition, decreased mobility, dizziness, impaired perceived functional ability, and pain.   ACTIVITY LIMITATIONS carrying, lifting, bending, sitting, standing, squatting, sleeping, stairs, transfers, bed mobility, and locomotion level  PARTICIPATION LIMITATIONS: meal prep, cleaning, laundry, driving, shopping, and community activity  PERSONAL FACTORS Past/current experiences and Time since onset of injury/illness/exacerbation are also affecting patient's functional outcome.   REHAB POTENTIAL: Good  CLINICAL DECISION MAKING: Evolving/moderate complexity  EVALUATION COMPLEXITY: Moderate   PLAN: PT FREQUENCY: 2x/week  PT DURATION: 3 weeks   PLANNED INTERVENTIONS: Therapeutic exercises, Therapeutic activity, Neuromuscular re-education, Balance training, Gait training, Patient/Family education, Joint manipulation, Joint mobilization, Stair training, Orthotic/Fit training, DME instructions, Aquatic Therapy, Dry Needling, Electrical stimulation, Spinal manipulation, Spinal mobilization, Cryotherapy, Moist heat, Compression bandaging, scar mobilization, Splintting, Taping, Traction, Ultrasound, Ionotophoresis '4mg'$ /ml Dexamethasone, and Manual therapy   PLAN FOR NEXT SESSION: Review reaction to [facility]; progress habituation exercises, more positional testing if needed.    4:08 PM, 06/18/22 [place] Stacie Knutzen MPT  physical therapy [place] [phone]

## 2022-06-19 ENCOUNTER — Ambulatory Visit (INDEPENDENT_AMBULATORY_CARE_PROVIDER_SITE_OTHER): Payer: Medicare Other | Admitting: Clinical

## 2022-06-19 DIAGNOSIS — F411 Generalized anxiety disorder: Secondary | ICD-10-CM | POA: Diagnosis not present

## 2022-06-19 NOTE — Patient Instructions (Signed)
[facility] [address] [place], [place] [address] [phone] (main office) [phone] (Biff Rutigliano's office)  [email or web address] 

## 2022-06-21 ENCOUNTER — Ambulatory Visit (HOSPITAL_COMMUNITY): Payer: Medicare Other

## 2022-06-21 DIAGNOSIS — R42 Dizziness and giddiness: Secondary | ICD-10-CM | POA: Diagnosis not present

## 2022-06-21 DIAGNOSIS — G8929 Other chronic pain: Secondary | ICD-10-CM | POA: Diagnosis not present

## 2022-06-21 DIAGNOSIS — M545 Low back pain, unspecified: Secondary | ICD-10-CM | POA: Diagnosis not present

## 2022-06-21 DIAGNOSIS — M533 Sacrococcygeal disorders, not elsewhere classified: Secondary | ICD-10-CM | POA: Diagnosis not present

## 2022-06-21 NOTE — Therapy (Cosign Needed)
OUTPATIENT PHYSICAL THERAPY VESTIBULAR TREATMENT     Patient Name: Kathryn Compton MRN: [id number] DOB:1950/12/07, 71 y.o., female Today's Date: 06/21/2022  PCP:   Carrolyn Meiers, MD   REFERRING PROVIDER: Raylene Miyamoto   PT End of Session - 06/21/22 1121     Visit Number 3    Number of Visits 6    Date for PT Re-Evaluation 06/26/22    Authorization Type [employer] Medicare    Progress Note Due on Visit 10    PT Start Time 1119    PT Stop Time 1200    PT Time Calculation (min) 41 min               Past Medical History:  Diagnosis Date   Anxiety    Depression    Hypertension    Motion sickness    Palpitations    Plantar fasciitis    Seizures ([place])    Vertigo    Past Surgical History:  Procedure Laterality Date   BREAST REDUCTION SURGERY     COLONOSCOPY N/A 09/06/2019   Procedure: COLONOSCOPY;  Surgeon: Danie Binder, MD;  [facility]: [facility];  Service: Endoscopy;  Laterality: N/A;  2:00   POLYPECTOMY  09/06/2019   Procedure: POLYPECTOMY;  Surgeon: Danie Binder, MD;  Location: [facility];  Service: Endoscopy;;   REDUCTION MAMMAPLASTY     TUBAL LIGATION     Patient Active Problem List   Diagnosis Date Noted   Morbid obesity ([place]) 07/31/2021   Pressure in head 06/19/2021   Essential hypertension 11/09/2020   Hip pain 11/09/2020   Papanicolaou smear of cervix with positive high risk human papilloma virus (HPV) test 08/01/2020   Encounter for screening fecal occult blood testing 08/01/2020   Routine medical exam 08/01/2020   Encounter for gynecological examination with Papanicolaou smear of cervix 08/01/2020   Abnormal gait 07/27/2020   Difficulty balancing 07/27/2020   Dizziness 07/27/2020   Sleep apnea 07/27/2020   Insomnia 06/01/2020   Obstructive sleep apnea of adult 06/01/2020   Vitamin D deficiency 05/31/2020   Anxiety disorder 04/20/2020   Palpitations 04/20/2020   Sacroiliac joint pain 04/20/2020   Seizure ([place]) 04/20/2020    Syncope and collapse 04/20/2020   Special screening for malignant neoplasms, colon     ONSET DATE: has been ongoing since she was child  REFERRING DIAG: PT eval/tx for dizziness per Raylene Miyamoto, MD   THERAPY DIAG:  Dizziness and giddiness  Rationale for Evaluation and Treatment Rehabilitation  SUBJECTIVE:   SUBJECTIVE STATEMENT: Patient tired from finishing up a crochet project. She slept hard last night; just woke up a little before appointment. Has not been able to perform HEP. Has been having hot flashes almost daily although she has been through menopause many years ago.    Pt accompanied by: self  PERTINENT HISTORY:    PAIN:  Are you having pain? No  PRECAUTIONS: None  WEIGHT BEARING RESTRICTIONS No  FALLS: Has patient fallen in last 6 months? No  PLOF: Independent  PATIENT GOALS less dizziness  OBJECTIVE:   DIAGNOSTIC FINDINGS:   FINDINGS: CT HEAD FINDINGS   Brain:   Cerebral volume is normal.   There is no acute intracranial hemorrhage.   No demarcated cortical infarct.   No extra-axial fluid collection.   No evidence of an intracranial mass.   No midline shift.   Vascular: No hyperdense vessel.   Skull: Normal. Negative for fracture or focal lesion.   Sinuses: No significant  paranasal sinus disease.   Orbits: No acute or significant orbital finding.   Review of the MIP images confirms the above findings   CTA NECK FINDINGS   Aortic arch: Standard aortic branching. The visualized aortic arch is normal in caliber. Minimal atherosclerotic plaque within the proximal major branch vessels of the neck. No hemodynamically significant innominate or proximal subclavian artery stenosis.   Right carotid system: CCA and ICA patent within the neck without stenosis or significant atherosclerotic disease. Partially retropharyngeal course of the ICA.   Left carotid system: CCA and ICA patent within the neck without stenosis or significant  atherosclerotic disease. Partially retropharyngeal course of the ICA.   Vertebral arteries: Codominant and patent within the neck without stenosis. Minimal nonstenotic calcified plaque within the V1 right vertebral artery.   Skeleton: Cervical spondylosis. Bilateral C2-C3 facet joint ankylosis. No acute bony abnormality or aggressive osseous lesion.   Other neck: No neck mass or cervical lymphadenopathy. Thyroid unremarkable.   Upper chest: Respiratory motion artifact limits evaluation of the imaged lung apices. No appreciable consolidation within the imaged lung apices.   Review of the MIP images confirms the above findings   CTA HEAD FINDINGS   Anterior circulation:   The intracranial internal carotid arteries are patent. Minimal nonstenotic calcified plaque within the cavernous left ICA. The M1 middle cerebral arteries are patent. Mild atherosclerotic irregularity of the M2 and more distal middle cerebral artery vessels, bilaterally. No M2 proximal branch occlusion or high-grade proximal stenosis is identified. The anterior cerebral arteries are patent. No intracranial aneurysm is identified.   Posterior circulation:   The intracranial vertebral arteries are patent. The basilar artery is patent. The posterior cerebral arteries are patent. Fetal origin right posterior cerebral artery. The left posterior communicating artery is diminutive or absent.   Venous sinuses: Within the limitations of contrast timing, no convincing thrombus.   Anatomic variants: As described.   Review of the MIP images confirms the above findings   IMPRESSION: CT head:   Unremarkable non-contrast CT appearance of the brain. No evidence of acute intracranial abnormality.   CTA neck:   1. The common carotid and internal carotid arteries are patent within the neck without stenosis or significant atherosclerotic disease. 2. The vertebral arteries are patent within the neck  without stenosis. Minimal non-stenotic calcified plaque within the V1 right vertebral artery.   CTA head:   1. No intracranial large vessel occlusion or proximal high-grade arterial stenosis. 2. Mild atherosclerotic disease within the intracranial left ICA and bilateral middle cerebral arteries, as described.    EXAM: MRI HEAD WITHOUT AND WITH CONTRAST   TECHNIQUE: Multiplanar, multiecho pulse sequences of the brain and surrounding structures were obtained without and with intravenous contrast.   CONTRAST:  74m GADAVIST GADOBUTROL 1 MMOL/ML IV SOLN   COMPARISON:  None.   FINDINGS: Brain: No acute or subacute infarction, hemorrhage, hydrocephalus, extra-axial collection or mass lesion. No cortical finding to explain seizure. Few remote white matter insults, less than typical for age. Normal brain volume.   Vascular: Normal flow voids, including vertebrobasilar.   Skull and upper cervical spine: Normal marrow signal   Sinuses/Orbits: Mild mucosal thickening in the right sphenoid sinus.   IMPRESSION: No explanation for symptoms.     COGNITION: Overall cognitive status: Within functional limits for tasks assessed   SENSATION:no numbness or tingling reported    Cervical ROM:  WFL without issue   BED MOBILITY:  Sit to supine Modified independence Supine to sit Modified independence  TRANSFERS:  Assistive device utilized: None  Sit to stand: Modified independence Stand to sit: Modified independence Chair to chair: Modified independence PATIENT SURVEYS:  DHI 14   VESTIBULAR ASSESSMENT   GENERAL OBSERVATION: wears glasses    SYMPTOM BEHAVIOR:   Subjective history: dizziness of unknown origin; no specific cause   Non-Vestibular symptoms: neck pain   Type of dizziness: Imbalance (Disequilibrium), Spinning/Vertigo, and Unsteady with head/body turns   Frequency: varies   Duration: varies   Aggravating factors: No known aggravating factors   Relieving  factors: no known relieving factors   Progression of symptoms: unchanged   OCULOMOTOR EXAM:   Ocular Alignment: normal   Ocular ROM: No Limitations   Spontaneous Nystagmus: absent   Gaze-Induced Nystagmus: absent   Smooth Pursuits: intact   Saccades: intact      VESTIBULAR - OCULAR REFLEX:    Slow VOR: Normal     Head-Impulse Test: HIT Right: negative HIT Left: negative         MOTION SENSITIVITY:    Motion Sensitivity Quotient  Intensity: 0 = none, 1 = Lightheaded, 2 = Mild, 3 = Moderate, 4 = Severe, 5 = Vomiting  Intensity  1. Sitting to supine 2  2. Supine to L side   3. Supine to R side   4. Supine to sitting 0  5. L Hallpike-Dix   6. Up from L    7. R Hallpike-Dix   8. Up from R    9. Sitting, head  tipped to L knee   10. Head up from L  knee 2  11. Sitting, head  tipped to R knee   12. Head up from R  knee 2  13. Sitting head turns x5 2  14.Sitting head nods x5 2  15. In stance, 180  turn to L    16. In stance, 180  turn to R     OTHOSTATICS: not done but patient reports HBP usually and has been checked before with positive orthostatic BP issues   VESTIBULAR TREATMENT:  06/21/22 Sidelying test to the right repeated no symptoms today Semont maneuver performed from right to left today; no symptoms.   Nustep seat 10, arms 5 x 5 min dynamic warm up  Heel raises 2 x 10 Toe raises 2 x 10 Tandem stance 1' each way Tandem stance with head turns x 10 each way, head nods x 10 Marching 2 x 10         06/18/22 Review of HEP and goals  DGI 1. Gait level surface (3) Normal: Walks 20', no assistive devices, good sped, no evidence for imbalance, normal gait pattern 2. Change in gait speed (3) Normal: Able to smoothly change walking speed without loss of balance or gait deviation. Shows a significant difference in walking speeds between normal, fast and slow speeds. 3. Gait with horizontal head turns (3) Normal: Performs head turns smoothly with no  change in gait. 4. Gait with vertical head turns (3) Normal: Performs head turns smoothly with no change in gait. 5. Gait and pivot turn (3) Normal: Pivot turns safely within 3 seconds and stops quickly with no loss of balance. 6. Step over obstacle (2) Mild Impairment: Is able to step over box, but must slow down and adjust steps to clear box safely. 7. Step around obstacles (3) Normal: Is able to walk around cones safely without changing gait speed; no evidence of imbalance. 8. Stairs (3) Normal: Alternating feet, no rail.  TOTAL SCORE: 23 / 24  BP  sitting 132/78; standing 128/78  Sidelying test; lightheaded on the Right but no nystagmus or vertigo  Brandt daroff exercise     Canalith Repositioning:not performed    Gaze Adaptation:   x1 Viewing Horizontal: Position: sitting, Time: 3, and Reps: 10 and x1 Viewing Vertical:  Position: sitting, Time: 3, and Reps: 10  HEP: Access Code: [email or web address] URL: [email or web address] Date: 06/21/2022 Prepared by: [facility]  Exercises - Tandem Stance with Head Rotation  - 2 x daily - 7 x weekly - 1 sets - 10 reps - Tandem Stance with Head Nods   - 2 x daily - 7 x weekly - 1 sets - 10 reps  PATIENT EDUCATION:  Education details: Patient educated on exam findings, POC, scope of PT, HEP, and types of vertigo/causes. Person educated: Patient Education method: Explanation, Demonstration, and Handouts Education comprehension: verbalized understanding, returned demonstration, verbal cues required, and tactile cues required  GOALS: Goals reviewed with patient? No  SHORT TERM GOALS: Target date: 06/19/2022    patient will be independent with initial HEP Baseline: Goal status: ongoing   LONG TERM GOALS: Target date: 06/26/2022    Patient will be independent in self management strategies to improve quality of life and functional outcomes.  Baseline:  Goal status: ongoing  2.  Patient will report at least 50% improvement  in overall symptoms and/or function to demonstrate improved functional mobility  Baseline:  Goal status: ongoing  3.  Patient will improve her score on the [place] to 12 or less to demonstrate decreased handicap due to dizziness Baseline: 14 Goal status: ongoing 4.  Patient will perform bed mobility and sit to stand without complaint of dizziness to improve tolerance for getting out of bed and standing up to start her day more efficiently.  Baseline:  Goal status: ongoing    ASSESSMENT:  CLINICAL IMPRESSION: Today's session started with review of HEP ; she has not been able to perform her HEP so unsure of results of HEP; trial of Right sidelying test today; negative; Semont manuveur performed since mild symptoms on the right last visit but no symptoms reported.  Worked on balance activities today and will monitor for any signs and symptoms of lightheadness. Patient will benefit from continued skilled therapy interventions to address deficits and improve functional mobility.    OBJECTIVE IMPAIRMENTS decreased activity tolerance, decreased balance, decreased knowledge of condition, decreased mobility, dizziness, impaired perceived functional ability, and pain.   ACTIVITY LIMITATIONS carrying, lifting, bending, sitting, standing, squatting, sleeping, stairs, transfers, bed mobility, and locomotion level  PARTICIPATION LIMITATIONS: meal prep, cleaning, laundry, driving, shopping, and community activity  PERSONAL FACTORS Past/current experiences and Time since onset of injury/illness/exacerbation are also affecting patient's functional outcome.   REHAB POTENTIAL: Good  CLINICAL DECISION MAKING: Evolving/moderate complexity  EVALUATION COMPLEXITY: Moderate   PLAN: PT FREQUENCY: 2x/week  PT DURATION: 3 weeks   PLANNED INTERVENTIONS: Therapeutic exercises, Therapeutic activity, Neuromuscular re-education, Balance training, Gait training, Patient/Family education, Joint manipulation,  Joint mobilization, Stair training, Orthotic/Fit training, DME instructions, Aquatic Therapy, Dry Needling, Electrical stimulation, Spinal manipulation, Spinal mobilization, Cryotherapy, Moist heat, Compression bandaging, scar mobilization, Splintting, Taping, Traction, Ultrasound, Ionotophoresis '4mg'$ /ml Dexamethasone, and Manual therapy   PLAN FOR NEXT SESSION: Review reaction to Nestor Lewandowsky; progress habituation exercises  11:56 AM, 06/21/22 Armie Moren Small Sonita Michiels MPT [facility] [place] [phone] [place]:[phone]

## 2022-06-24 DIAGNOSIS — I10 Essential (primary) hypertension: Secondary | ICD-10-CM | POA: Diagnosis not present

## 2022-06-24 NOTE — BH Specialist Note (Signed)
Integrated Behavioral Health via Telemedicine Visit  07/03/2022 Kathryn Compton [id number]  Number of [place] Clinician visits: Additional Visit  Session Start time: 3354   Session End time: 5625  Total time in minutes: 12   Referring Provider: Darron Doom, MD Patient/Family location: Home [name] Provider location: [facility] [place] [facility]  All persons participating in visit: [name] and Kathryn Compton   Types of Service: Individual psychotherapy and Telephone visit  I connected with Kathryn Compton and/or Kathryn Compton's  n/a  via  Telephone or Video Enabled Telemedicine Application  (Video is Caregility application) and verified that I am speaking with the correct person using two identifiers. Discussed confidentiality: Yes   I discussed the limitations of telemedicine and the availability of in person appointments.  Discussed there is a possibility of technology failure and discussed alternative modes of communication if that failure occurs.  I discussed that engaging in this telemedicine visit, they consent to the provision of behavioral healthcare and the services will be billed under their insurance.  Patient and/or legal guardian expressed understanding and consented to Telemedicine visit: Yes   Presenting Concerns: Patient and/or family reports the following symptoms/concerns: Disappointment over physical therapy ending soon, as well as preparing for surgery with liquid diet; began working out at gym one hour/day with boyfriend; continuing to work on projects.  Duration of problem: Ongoing; Severity of problem: moderate  Patient and/or Family's Strengths/Protective Factors: Social connections, [place] supports in place (healthy food, safe environments, etc.), Sense of purpose, and Physical Health (exercise, healthy diet, medication compliance, etc.)  Goals Addressed: Patient will:   Maintain reduction of symptoms of: anxiety   Progress towards Goals: Ongoing  Interventions: Interventions utilized:  Supportive Reflection Standardized Assessments completed: Not Needed  Patient and/or Family Response: Patient agrees with treatment plan.   Assessment: Patient currently experiencing Generalized anxiety disorder.   Patient may benefit from continued therapeutic interventions.  Plan: Follow up with behavioral health clinician on : Three weeks Behavioral recommendations:  -Continue working on projects (screenplay; Chemical engineer; working out at gym) while Educational psychologist for upcoming surgery -Continue plan to submit screenplay to contest by September deadline, as well as additional submissions -Continue to consider copyrighting screenplay after discussing with legal (possible legal advice through [email or web address])   Referral(s): [place] (In Clinic)  I discussed the assessment and treatment plan with the patient and/or parent/guardian. They were provided an opportunity to ask questions and all were answered. They agreed with the plan and demonstrated an understanding of the instructions.   They were advised to call back or seek an in-person evaluation if the symptoms worsen or if the condition fails to improve as anticipated.  Kathryn Hamman Arlys Scatena, LCSW

## 2022-06-26 ENCOUNTER — Ambulatory Visit: Payer: Medicare Other | Admitting: Dietician

## 2022-06-27 ENCOUNTER — Encounter: Payer: Medicare Other | Attending: General Surgery | Admitting: Dietician

## 2022-06-27 DIAGNOSIS — R5382 Chronic fatigue, unspecified: Secondary | ICD-10-CM | POA: Diagnosis not present

## 2022-06-27 DIAGNOSIS — E559 Vitamin D deficiency, unspecified: Secondary | ICD-10-CM | POA: Diagnosis present

## 2022-06-27 DIAGNOSIS — G4733 Obstructive sleep apnea (adult) (pediatric): Secondary | ICD-10-CM | POA: Diagnosis not present

## 2022-06-27 DIAGNOSIS — Z6841 Body Mass Index (BMI) 40.0 and over, adult: Secondary | ICD-10-CM | POA: Diagnosis not present

## 2022-06-27 DIAGNOSIS — E569 Vitamin deficiency, unspecified: Secondary | ICD-10-CM | POA: Insufficient documentation

## 2022-06-27 DIAGNOSIS — I10 Essential (primary) hypertension: Secondary | ICD-10-CM

## 2022-06-27 DIAGNOSIS — Z713 Dietary counseling and surveillance: Secondary | ICD-10-CM | POA: Insufficient documentation

## 2022-06-27 DIAGNOSIS — L659 Nonscarring hair loss, unspecified: Secondary | ICD-10-CM | POA: Diagnosis not present

## 2022-06-27 NOTE — Progress Notes (Signed)
Supervised Weight Loss for Bariatric Surgery Appt start time: 1145 end time:  1210  SWL Appointment 2 of 6   Anthropometrics Start Weight at [facility]: 267.9lbs  (04/19/22) Weight: 267.9lbs Height: 5'7"  BMI: 41.96  Clinical Medications: no changes since previous visit Health/ medical history changes: no changes per patient. ,  Dietary/ Lifestyle Progress: Has joined [facility] and has begun cardiovascular exercise.  Has been having some liquid-only days, currently 2 times a week. She is planning to do this 5 days in a row once a month. Reports feeling great on these days.  Weight loss of 3.1lbs since previous visit on 05/29/22.  Dietary intake: Eating Pattern: 3 meals and 1 snacks daily Dining out: 0-1  Breakfast: meal replacement shake with coffee Snack: none  Lunch: cottage cheese and fruit; veg drink (includes beets, daikon radish, carrots, others, + fruit) 2x a week; sometimes skips if not hungry Snack: none Dinner: pasta with broccoli rabe and mushrooms; [place] sandwich on low carb bread Snack: cottage cheese and fruit Beverages: water, coffee with meal replacement shake, vegetable and fruit smoothies  Usual physical activity: treadmill, recumbant bicycle 60 minutes 3-5 times a week, started last week    Intervention:   Nutrition counseling for weight loss prior to upcoming bariatric surgery. Advised caution with too-frequent days of only liquid nutrition, and importance of macro and micronutrients from all food groups. Discussed potential metabolic effects of very low calorie dieting. Discussed potential low blood sugar with low kcal intake and increased exercise.  Established goals to maintain adequate nutrition while continuing with weight loss.               Nutritional Diagnosis:  [place]-3.3 Overweight/obesity related to history of excess calories and inadequate physical activity as evidenced by patient with current BMI of 41.96, following dietary guidelines for weight loss prior to  bariatric surgery.               Teaching Method Utilized:  Visual Auditory   Learning Readiness:  Change in progress  Barriers to learning/adherence to lifestyle change: none  Demonstrated degree of understanding via:  Teach Back   Plan:   return for next weight loss visit on 07/25/22 at 11:30am

## 2022-06-27 NOTE — Patient Instructions (Signed)
Monitor energy and physical symptoms when doing a liquid-only day; if feeling weak, add a serving of starch such as bread or brown rice or pasta.  Regular exercise and very low calorie intake could lead to low blood sugar and low energy, When looking for a protein supplement powder, look for fortification with iron, vitamin B12, zinc, calcium.

## 2022-07-02 ENCOUNTER — Ambulatory Visit (HOSPITAL_COMMUNITY): Payer: Medicare Other | Attending: Internal Medicine

## 2022-07-02 DIAGNOSIS — M545 Low back pain, unspecified: Secondary | ICD-10-CM | POA: Insufficient documentation

## 2022-07-02 DIAGNOSIS — R42 Dizziness and giddiness: Secondary | ICD-10-CM | POA: Insufficient documentation

## 2022-07-02 DIAGNOSIS — G8929 Other chronic pain: Secondary | ICD-10-CM | POA: Insufficient documentation

## 2022-07-02 DIAGNOSIS — M533 Sacrococcygeal disorders, not elsewhere classified: Secondary | ICD-10-CM | POA: Diagnosis not present
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
## 2022-07-03 ENCOUNTER — Ambulatory Visit (INDEPENDENT_AMBULATORY_CARE_PROVIDER_SITE_OTHER): Payer: Medicare Other | Admitting: Clinical

## 2022-07-03 DIAGNOSIS — F411 Generalized anxiety disorder: Secondary | ICD-10-CM | POA: Diagnosis not present

## 2022-07-04 ENCOUNTER — Encounter (HOSPITAL_COMMUNITY): Payer: Medicare Other

## 2022-07-05 ENCOUNTER — Other Ambulatory Visit (HOSPITAL_COMMUNITY): Payer: Self-pay | Admitting: General Surgery

## 2022-07-05 ENCOUNTER — Other Ambulatory Visit: Payer: Self-pay | Admitting: General Surgery

## 2022-07-05 DIAGNOSIS — Z01818 Encounter for other preprocedural examination: Secondary | ICD-10-CM

## 2022-07-09 ENCOUNTER — Ambulatory Visit (HOSPITAL_COMMUNITY): Payer: Medicare Other

## 2022-07-09 DIAGNOSIS — M545 Low back pain, unspecified: Secondary | ICD-10-CM | POA: Diagnosis not present

## 2022-07-09 DIAGNOSIS — M533 Sacrococcygeal disorders, not elsewhere classified: Secondary | ICD-10-CM | POA: Diagnosis not present

## 2022-07-09 DIAGNOSIS — R42 Dizziness and giddiness: Secondary | ICD-10-CM | POA: Diagnosis not present

## 2022-07-09 DIAGNOSIS — G8929 Other chronic pain: Secondary | ICD-10-CM

## 2022-07-09 NOTE — Therapy (Signed)
OUTPATIENT PHYSICAL THERAPY VESTIBULAR DISCHARGE  PHYSICAL THERAPY DISCHARGE SUMMARY  Visits from Start of Care: 5  Current functional level related to goals / functional outcomes: See below   Remaining deficits: See below   Education / Equipment: See below   Patient agrees to discharge. Patient goals were partially met. Patient is being discharged due to lack of progress.       Patient Name: Kathryn Compton MRN: [id number] DOB:11/16/1951, 71 y.o., female Today's Date: 07/09/2022  PCP:   Carrolyn Meiers, MD   REFERRING PROVIDER: Raylene Miyamoto   PT End of Session - 07/09/22 1304     Visit Number 5    Number of Visits 8    Date for PT Re-Evaluation 07/11/22    Authorization Type [employer] Medicare    Progress Note Due on Visit 10    PT Start Time 1303    PT Stop Time 1333    PT Time Calculation (min) 30 min                 Past Medical History:  Diagnosis Date   Anxiety    Depression    Hypertension    Motion sickness    Palpitations    Plantar fasciitis    Seizures ([place])    Vertigo    Past Surgical History:  Procedure Laterality Date   BREAST REDUCTION SURGERY     COLONOSCOPY N/A 09/06/2019   Procedure: COLONOSCOPY;  Surgeon: Danie Binder, MD;  [facility];  Service: Endoscopy;  Laterality: N/A;  2:00   POLYPECTOMY  09/06/2019   Procedure: POLYPECTOMY;  Surgeon: Danie Binder, MD;  Location: [facility];  Service: Endoscopy;;   REDUCTION MAMMAPLASTY     TUBAL LIGATION     Patient Active Problem List   Diagnosis Date Noted   Morbid obesity ([place]) 07/31/2021   Pressure in head 06/19/2021   Essential hypertension 11/09/2020   Hip pain 11/09/2020   Papanicolaou smear of cervix with positive high risk human papilloma virus (HPV) test 08/01/2020   Encounter for screening fecal occult blood testing 08/01/2020   Routine medical exam 08/01/2020   Encounter for gynecological examination with Papanicolaou smear of cervix  08/01/2020   Abnormal gait 07/27/2020   Difficulty balancing 07/27/2020   Dizziness 07/27/2020   Sleep apnea 07/27/2020   Insomnia 06/01/2020   Obstructive sleep apnea of adult 06/01/2020   Vitamin D deficiency 05/31/2020   Anxiety disorder 04/20/2020   Palpitations 04/20/2020   Sacroiliac joint pain 04/20/2020   Seizure ([place]) 04/20/2020   Syncope and collapse 04/20/2020   Special screening for malignant neoplasms, colon     ONSET DATE: has been ongoing since she was child  REFERRING DIAG: PT eval/tx for dizziness per Raylene Miyamoto, MD   THERAPY DIAG:  Dizziness and giddiness  Sacroiliac joint pain  Chronic midline low back pain without sciatica  Rationale for Evaluation and Treatment Rehabilitation  SUBJECTIVE:   SUBJECTIVE STATEMENT: No change in her symptoms; had several episodes over the weekend.  Not sleeping well the past few night.    Pt accompanied by: self  PERTINENT HISTORY:    PAIN:  Are you having pain? No  PRECAUTIONS: None  WEIGHT BEARING RESTRICTIONS No  FALLS: Has patient fallen in last 6 months? No  PLOF: Independent  PATIENT GOALS less dizziness  OBJECTIVE:   DIAGNOSTIC FINDINGS:   FINDINGS: CT HEAD FINDINGS   Brain:   Cerebral volume is normal.   There is no  acute intracranial hemorrhage.   No demarcated cortical infarct.   No extra-axial fluid collection.   No evidence of an intracranial mass.   No midline shift.   Vascular: No hyperdense vessel.   Skull: Normal. Negative for fracture or focal lesion.   Sinuses: No significant paranasal sinus disease.   Orbits: No acute or significant orbital finding.   Review of the MIP images confirms the above findings   CTA NECK FINDINGS   Aortic arch: Standard aortic branching. The visualized aortic arch is normal in caliber. Minimal atherosclerotic plaque within the proximal major branch vessels of the neck. No hemodynamically significant innominate or proximal subclavian  artery stenosis.   Right carotid system: CCA and ICA patent within the neck without stenosis or significant atherosclerotic disease. Partially retropharyngeal course of the ICA.   Left carotid system: CCA and ICA patent within the neck without stenosis or significant atherosclerotic disease. Partially retropharyngeal course of the ICA.   Vertebral arteries: Codominant and patent within the neck without stenosis. Minimal nonstenotic calcified plaque within the V1 right vertebral artery.   Skeleton: Cervical spondylosis. Bilateral C2-C3 facet joint ankylosis. No acute bony abnormality or aggressive osseous lesion.   Other neck: No neck mass or cervical lymphadenopathy. Thyroid unremarkable.   Upper chest: Respiratory motion artifact limits evaluation of the imaged lung apices. No appreciable consolidation within the imaged lung apices.   Review of the MIP images confirms the above findings   CTA HEAD FINDINGS   Anterior circulation:   The intracranial internal carotid arteries are patent. Minimal nonstenotic calcified plaque within the cavernous left ICA. The M1 middle cerebral arteries are patent. Mild atherosclerotic irregularity of the M2 and more distal middle cerebral artery vessels, bilaterally. No M2 proximal branch occlusion or high-grade proximal stenosis is identified. The anterior cerebral arteries are patent. No intracranial aneurysm is identified.   Posterior circulation:   The intracranial vertebral arteries are patent. The basilar artery is patent. The posterior cerebral arteries are patent. Fetal origin right posterior cerebral artery. The left posterior communicating artery is diminutive or absent.   Venous sinuses: Within the limitations of contrast timing, no convincing thrombus.   Anatomic variants: As described.   Review of the MIP images confirms the above findings   IMPRESSION: CT head:   Unremarkable non-contrast CT appearance of the brain.  No evidence of acute intracranial abnormality.   CTA neck:   1. The common carotid and internal carotid arteries are patent within the neck without stenosis or significant atherosclerotic disease. 2. The vertebral arteries are patent within the neck without stenosis. Minimal non-stenotic calcified plaque within the V1 right vertebral artery.   CTA head:   1. No intracranial large vessel occlusion or proximal high-grade arterial stenosis. 2. Mild atherosclerotic disease within the intracranial left ICA and bilateral middle cerebral arteries, as described.    EXAM: MRI HEAD WITHOUT AND WITH CONTRAST   TECHNIQUE: Multiplanar, multiecho pulse sequences of the brain and surrounding structures were obtained without and with intravenous contrast.   CONTRAST:  81m GADAVIST GADOBUTROL 1 MMOL/ML IV SOLN   COMPARISON:  None.   FINDINGS: Brain: No acute or subacute infarction, hemorrhage, hydrocephalus, extra-axial collection or mass lesion. No cortical finding to explain seizure. Few remote white matter insults, less than typical for age. Normal brain volume.   Vascular: Normal flow voids, including vertebrobasilar.   Skull and upper cervical spine: Normal marrow signal   Sinuses/Orbits: Mild mucosal thickening in the right sphenoid sinus.   IMPRESSION: No explanation  for symptoms.     COGNITION: Overall cognitive status: Within functional limits for tasks assessed   SENSATION:no numbness or tingling reported    Cervical ROM:  WFL without issue   BED MOBILITY:  Sit to supine Modified independence Supine to sit Modified independence  TRANSFERS: Assistive device utilized: None  Sit to stand: Modified independence Stand to sit: Modified independence Chair to chair: Modified independence PATIENT SURVEYS:  DHI 14   VESTIBULAR ASSESSMENT   GENERAL OBSERVATION: wears glasses    SYMPTOM BEHAVIOR:   Subjective history: dizziness of unknown origin; no specific  cause   Non-Vestibular symptoms: neck pain   Type of dizziness: Imbalance (Disequilibrium), Spinning/Vertigo, and Unsteady with head/body turns   Frequency: varies   Duration: varies   Aggravating factors: No known aggravating factors   Relieving factors: no known relieving factors   Progression of symptoms: unchanged   OCULOMOTOR EXAM:   Ocular Alignment: normal   Ocular ROM: No Limitations   Spontaneous Nystagmus: absent   Gaze-Induced Nystagmus: absent   Smooth Pursuits: intact   Saccades: intact      VESTIBULAR - OCULAR REFLEX:    Slow VOR: Normal     Head-Impulse Test: HIT Right: negative HIT Left: negative         MOTION SENSITIVITY:    Motion Sensitivity Quotient  Intensity: 0 = none, 1 = Lightheaded, 2 = Mild, 3 = Moderate, 4 = Severe, 5 = Vomiting  Intensity 07/09/22  1. Sitting to supine 2   2. Supine to L side    3. Supine to R side    4. Supine to sitting 0   5. L Hallpike-Dix    6. Up from L     7. R Hallpike-Dix    8. Up from R     9. Sitting, head  tipped to L knee  0  10. Head up from L  knee 2 0  11. Sitting, head  tipped to R knee  0  12. Head up from R  knee 2 0  13. Sitting head turns x5 2 1  14.Sitting head nods x5 2 1  15. In stance, 180  turn to L   0  16. In stance, 180  turn to R  0    OTHOSTATICS: not done but patient reports HBP usually and has been checked before with positive orthostatic BP issues   VESTIBULAR TREATMENT: 07/09/22 Nustep seat 10, arms 5 x 5 min dynamic warm up  5 x sit to stand 11 sec 2 MWT 549 ft      07/02/22 Nustep seat 10, arms 5 x 5 min dynamic warm up   // bars Heel raises 2 x 10 Toe raises 2 x 10 Tandem stance on foam x    30" each Tandem stance with head turns and nods x 10 each Marching x 20 Sit to stand x 10 no UE assist Hip vectors 3" hold x 8 each 6" step toe taps x 20  DHI 6 today (14 at eval, goal met)     06/21/22 Sidelying test to the right repeated no symptoms today Semont  maneuver performed from right to left today; no symptoms.   Nustep seat 10, arms 5 x 5 min dynamic warm up  Heel raises 2 x 10 Toe raises 2 x 10 Tandem stance 1' each way Tandem stance with head turns x 10 each way, head nods x 10 Marching 2 x 10  06/18/22 Review of HEP and goals  DGI 1. Gait level surface (3) Normal: Walks 20', no assistive devices, good sped, no evidence for imbalance, normal gait pattern 2. Change in gait speed (3) Normal: Able to smoothly change walking speed without loss of balance or gait deviation. Shows a significant difference in walking speeds between normal, fast and slow speeds. 3. Gait with horizontal head turns (3) Normal: Performs head turns smoothly with no change in gait. 4. Gait with vertical head turns (3) Normal: Performs head turns smoothly with no change in gait. 5. Gait and pivot turn (3) Normal: Pivot turns safely within 3 seconds and stops quickly with no loss of balance. 6. Step over obstacle (2) Mild Impairment: Is able to step over box, but must slow down and adjust steps to clear box safely. 7. Step around obstacles (3) Normal: Is able to walk around cones safely without changing gait speed; no evidence of imbalance. 8. Stairs (3) Normal: Alternating feet, no rail.  TOTAL SCORE: 23 / 24  BP sitting 132/78; standing 128/78  Sidelying test; lightheaded on the Right but no nystagmus or vertigo  Brandt daroff exercise     Canalith Repositioning:not performed    Gaze Adaptation:   x1 Viewing Horizontal: Position: sitting, Time: 3, and Reps: 10 and x1 Viewing Vertical:  Position: sitting, Time: 3, and Reps: 10  HEP: Access Code: [email or web address] URL: [email or web address] Date: 06/21/2022 Prepared by: [facility]  Exercises - Tandem Stance with Head Rotation  - 2 x daily - 7 x weekly - 1 sets - 10 reps - Tandem Stance with Head Nods   - 2 x daily - 7 x weekly - 1 sets - 10 reps  PATIENT EDUCATION:   Education details: Patient educated on exam findings, POC, scope of PT, HEP, and types of vertigo/causes. Person educated: Patient Education method: Explanation, Demonstration, and Handouts Education comprehension: verbalized understanding, returned demonstration, verbal cues required, and tactile cues required  GOALS: Goals reviewed with patient? No  SHORT TERM GOALS: Target date: 06/19/2022    patient will be independent with initial HEP Baseline: Goal status: met   LONG TERM GOALS: Target date: 06/26/2022    Patient will be independent in self management strategies to improve quality of life and functional outcomes.  Baseline:  Goal status: ongoing  2.  Patient will report at least 50% improvement in overall symptoms and/or function to demonstrate improved functional mobility  Baseline: no change Goal status: ongoing  3.  Patient will improve her score on the Alpha to 12 or less to demonstrate decreased handicap due to dizziness Baseline: 14; 07/02/22 DHI 6 Goal status: met 4.  Patient will perform bed mobility and sit to stand without complaint of dizziness to improve tolerance for getting out of bed and standing up to start her day more efficiently.  Baseline:  Goal status: ongoing    ASSESSMENT:  CLINICAL IMPRESSION: Patient with little changes in episodes. Decreased initially but now back to previous frequency. She has made some improvement with DHI and compliant of dizziness with movement but is independent with HEP and is working out at [employer]; agreeable to discharge at this time.    OBJECTIVE IMPAIRMENTS decreased activity tolerance, decreased balance, decreased knowledge of condition, decreased mobility, dizziness, impaired perceived functional ability, and pain.   ACTIVITY LIMITATIONS carrying, lifting, bending, sitting, standing, squatting, sleeping, stairs, transfers, bed mobility, and locomotion level  PARTICIPATION LIMITATIONS: meal prep, cleaning, laundry,  driving, shopping, and community activity  PERSONAL FACTORS  Past/current experiences and Time since onset of injury/illness/exacerbation are also affecting patient's functional outcome.   REHAB POTENTIAL: Good  CLINICAL DECISION MAKING: Evolving/moderate complexity  EVALUATION COMPLEXITY: Moderate   PLAN: PT FREQUENCY: 2x/week  PT DURATION: 3 weeks   PLANNED INTERVENTIONS: Therapeutic exercises, Therapeutic activity, Neuromuscular re-education, Balance training, Gait training, Patient/Family education, Joint manipulation, Joint mobilization, Stair training, Orthotic/Fit training, DME instructions, Aquatic Therapy, Dry Needling, Electrical stimulation, Spinal manipulation, Spinal mobilization, Cryotherapy, Moist heat, Compression bandaging, scar mobilization, Splintting, Taping, Traction, Ultrasound, Ionotophoresis 34m/ml Dexamethasone, and Manual therapy   PLAN FOR NEXT SESSION: discharge  1:35 PM, 07/09/22 Amy Small Lynch MPT [facility] [place] #[phone]:[phone]

## 2022-07-11 ENCOUNTER — Encounter (HOSPITAL_COMMUNITY): Payer: Self-pay

## 2022-07-11 ENCOUNTER — Ambulatory Visit (HOSPITAL_COMMUNITY)
Admission: RE | Admit: 2022-07-11 | Discharge: 2022-07-11 | Disposition: A | Discharge status: Home / Self Care | Payer: Medicare Other | Source: Ambulatory Visit | Attending: General Surgery | Admitting: General Surgery

## 2022-07-11 ENCOUNTER — Encounter (HOSPITAL_COMMUNITY): Payer: Medicare Other

## 2022-07-11 DIAGNOSIS — Z01818 Encounter for other preprocedural examination: Secondary | ICD-10-CM

## 2022-07-16 NOTE — BH Specialist Note (Signed)
Integrated Behavioral Health via Telemedicine Visit  07/29/2022 [name] [id number]  Number of [place] Clinician visits: Additional Visit  Session Start time: 7902   Session End time: 1117  Total time in minutes: 28   Referring Provider: Darron Doom, MD Patient/Family location: Home [facility] Provider location: [facility] [place] [facility] All persons participating in visit: [name] and [place]   Types of Service: Individual psychotherapy and Telephone visit  I connected with [name] and/or [name] [place]'s  n/a  via  Telephone or Video Enabled Telemedicine Application  (Video is Caregility application) and verified that I am speaking with the correct person using two identifiers. Discussed confidentiality: Yes   I discussed the limitations of telemedicine and the availability of in person appointments.  Discussed there is a possibility of technology failure and discussed alternative modes of communication if that failure occurs.  I discussed that engaging in this telemedicine visit, they consent to the provision of behavioral healthcare and the services will be billed under their insurance.  Patient and/or legal guardian expressed understanding and consented to Telemedicine visit: Yes   Presenting Concerns: Patient and/or family reports the following symptoms/concerns: Uncertainty regarding problem knee; contemplating relationship with estranged mother.  Duration of problem: Ongoing; Severity of problem: moderate  Patient and/or Family's Strengths/Protective Factors: Social connections, Concrete supports in place (healthy food, safe environments, etc.), Sense of purpose, and Physical Health (exercise, healthy diet, medication compliance, etc.)  Goals Addressed: Patient will:  Maintain reduction of symptoms of: anxiety   Progress towards  Goals: Ongoing  Interventions: Interventions utilized:  Supportive Reflection Standardized Assessments completed: Not Needed  Patient and/or Family Response: Patient agrees with treatment plan.   Assessment: Patient currently experiencing Generalized anxiety disorder.   Patient may benefit from continued therapeutic interventions.  Plan: Follow up with behavioral health clinician on : Two weeks Behavioral recommendations:  -Continue staying active with daily projects while awaiting post-MRI of knee appointment to find out next step to take -Continue pre-surgery diet; follow nutritionist advise -Continue setting healthy boundaries with negative family members Referral(s): [place] (In Clinic)  I discussed the assessment and treatment plan with the patient and/or parent/guardian. They were provided an opportunity to ask questions and all were answered. They agreed with the plan and demonstrated an understanding of the instructions.   They were advised to call back or seek an in-person evaluation if the symptoms worsen or if the condition fails to improve as anticipated.  Caroleen Hamman Edrie Ehrich, LCSW

## 2022-07-25 ENCOUNTER — Encounter: Payer: Medicare Other | Attending: General Surgery | Admitting: Dietician

## 2022-07-25 ENCOUNTER — Encounter: Payer: Self-pay | Admitting: Dietician

## 2022-07-25 DIAGNOSIS — Z6841 Body Mass Index (BMI) 40.0 and over, adult: Secondary | ICD-10-CM | POA: Insufficient documentation

## 2022-07-25 DIAGNOSIS — L659 Nonscarring hair loss, unspecified: Secondary | ICD-10-CM | POA: Diagnosis not present

## 2022-07-25 DIAGNOSIS — E559 Vitamin D deficiency, unspecified: Secondary | ICD-10-CM | POA: Insufficient documentation

## 2022-07-25 DIAGNOSIS — R5382 Chronic fatigue, unspecified: Secondary | ICD-10-CM | POA: Insufficient documentation

## 2022-07-25 DIAGNOSIS — G4733 Obstructive sleep apnea (adult) (pediatric): Secondary | ICD-10-CM | POA: Insufficient documentation

## 2022-07-25 DIAGNOSIS — I10 Essential (primary) hypertension: Secondary | ICD-10-CM | POA: Insufficient documentation

## 2022-07-25 DIAGNOSIS — Z713 Dietary counseling and surveillance: Secondary | ICD-10-CM | POA: Insufficient documentation

## 2022-07-25 NOTE — Patient Instructions (Signed)
Avoid going hungry for long periods of time, this leads to lower metabolism and can hinder weight loss.  Continue to eat foods from all food groups, even on liquid days to ensure adequate nutrition.  Great job increasing exercise!!

## 2022-07-25 NOTE — Progress Notes (Signed)
Supervised Weight Loss for Bariatric Surgery Appt start time: 1130 end time:  1200.  SWL Appointment 4 of 6   Anthropometrics Start Weight at [facility]: 267.9lbs  (05/01/22) Weight: 267.1lbs Height: 5'7"  BMI: 41.93  Clinical Medications: no changes since previous visit Health/ medical history changes: no changes  Dietary/ Lifestyle Progress: Patient reports increase in physical activity, and is enjoying workouts. She is following a partial liquid diet and plans to continue.    Dietary intake: Eating Pattern: 3 meals and 0-1 snacks daily-- 2 days a week with liquid nutrition only Dining out:   Breakfast: meal replacement shake with veg, fruit + protein supplement  Snack: none  Lunch: lean protein + veg + pasta/ starch; sandwich with [place];  2x a week smoothie with fruit, milk, veg, protein powder supplement Snack: none Dinner: 5pm small amount leftovers;  Snack: occasionally 1 bite of something sweet (partner offers)  Beverages: water, coffee, protein drinks/ smoothies; flavored sparkling water  Usual physical activity: cardio 40 minutes + weight training 20 minutes 3x a week + walking 1-1.5 miles 2 times a week    Intervention:   Nutrition counseling for weight loss prior to upcoming bariatric surgery.               Nutritional Diagnosis:  [place]-3.3 Overweight/obesity related to history of excess calories and inadequate physical activity as evidenced by patient with current BMI of 41.93, following dietary guidelines for weight loss prior to bariatric surgery.               Teaching Method Utilized:  Visual Auditory Hands on   Learning Readiness:  Change in progress  Barriers to learning/adherence to lifestyle change: none  Demonstrated degree of understanding via:  Teach Back   Plan:   follow up 08/22/22 at 1:15pm

## 2022-07-27 DIAGNOSIS — M25561 Pain in right knee: Secondary | ICD-10-CM | POA: Diagnosis not present

## 2022-07-29 ENCOUNTER — Ambulatory Visit (INDEPENDENT_AMBULATORY_CARE_PROVIDER_SITE_OTHER): Payer: Medicare Other | Admitting: Clinical

## 2022-07-29 DIAGNOSIS — F411 Generalized anxiety disorder: Secondary | ICD-10-CM

## 2022-07-29 NOTE — BH Specialist Note (Signed)
Integrated Behavioral Health via Telemedicine Visit  08/12/2022 Kathryn Compton [id number]  Number of [place] Clinician visits: Additional Visit  Session Start time: 6237   Session End time: 1117  Total time in minutes: 28   Referring Provider: Darron Doom, MD Patient/Family location: Home [facility] Provider location: [facility] [place] [facility]  All persons participating in visit: [name] and [place]   Types of Service: Individual psychotherapy and Telephone visit  I connected with [name] and/or [name] [place]'s  n/a  via  Telephone or Video Enabled Telemedicine Application  (Video is Caregility application) and verified that I am speaking with the correct person using two identifiers. Discussed confidentiality: Yes   I discussed the limitations of telemedicine and the availability of in person appointments.  Discussed there is a possibility of technology failure and discussed alternative modes of communication if that failure occurs.  I discussed that engaging in this telemedicine visit, they consent to the provision of behavioral healthcare and the services will be billed under their insurance.  Patient and/or legal guardian expressed understanding and consented to Telemedicine visit: Yes   Presenting Concerns: Patient and/or family reports the following symptoms/concerns: Life stress(preparing for surgery, upcoming travel opportunity; boyfriend's sick mother and job search) continued daily workouts to cope Duration of problem: Ongoing; Severity of problem: moderate  Patient and/or Family's Strengths/Protective Factors: Social connections, Concrete supports in place (healthy food, safe environments, etc.), Sense of purpose, and Physical Health (exercise, healthy diet, medication compliance, etc.)  Goals Addressed: Patient will:  Reduce symptoms of: anxiety     Progress towards  Goals: Ongoing  Interventions: Interventions utilized:  Supportive Reflection Standardized Assessments completed: Not Needed  Patient and/or Family Response: Patient agrees with treatment plan.   Assessment: Patient currently experiencing Generalized anxiety disorder.   Patient may benefit from continued therapeutic interventions.  Plan: Follow up with behavioral health clinician on : Two weeks Behavioral recommendations:  -Continue daily workouts; continue following medical advice regarding pre-surgery diet -Maintain healthy boundaries with family members Referral(s): [place] (In Clinic)  I discussed the assessment and treatment plan with the patient and/or parent/guardian. They were provided an opportunity to ask questions and all were answered. They agreed with the plan and demonstrated an understanding of the instructions.   They were advised to call back or seek an in-person evaluation if the symptoms worsen or if the condition fails to improve as anticipated.  Caroleen Hamman Adelaine Roppolo, LCSW

## 2022-07-30 ENCOUNTER — Ambulatory Visit: Payer: Medicare Other | Admitting: Cardiology

## 2022-07-30 ENCOUNTER — Encounter: Payer: Medicare Other | Admitting: Obstetrics & Gynecology

## 2022-07-31 DIAGNOSIS — S8391XA Sprain of unspecified site of right knee, initial encounter: Secondary | ICD-10-CM | POA: Diagnosis not present

## 2022-08-01 ENCOUNTER — Encounter: Payer: Self-pay | Admitting: Neurology

## 2022-08-01 ENCOUNTER — Ambulatory Visit (INDEPENDENT_AMBULATORY_CARE_PROVIDER_SITE_OTHER): Payer: Medicare Other | Admitting: Neurology

## 2022-08-01 VITALS — BP 146/83 | HR 65 | Ht 67.0 in | Wt 267.0 lb

## 2022-08-01 DIAGNOSIS — R42 Dizziness and giddiness: Secondary | ICD-10-CM | POA: Diagnosis not present

## 2022-08-01 DIAGNOSIS — G43709 Chronic migraine without aura, not intractable, without status migrainosus: Secondary | ICD-10-CM | POA: Diagnosis not present

## 2022-08-01 MED ORDER — AJOVY 225 MG/1.5ML ~~LOC~~ SOAJ: 225.0000 mg | SUBCUTANEOUS | 11 refills | Status: DC

## 2022-08-01 NOTE — Progress Notes (Signed)
Chief Complaint  Patient presents with   Follow-up    Room 14,  States dizzy spells come and go, worst while moving       ASSESSMENT AND PLAN  Kathryn Compton is a 71 y.o. female   Long history of intermittent dizziness episodes with frequent occurrence,  Was previously treated as seizure, tried different antiepileptic medications outside neurologist without improvement  Continue have occasional occurrence,  Prolonged 72 hours video EEG showed multiple pushbutton event, but not correlate with EEG changes,  MRI of the brain was normal  She describes episode mostly happen when standing up, with sudden positional change,  Today's examination showed no orthostatic blood pressure change,  Suggest her increase water intake, moderate exercise Chronic migraine  Already on preventive medications beta-blocker, SSRI,  Still has frequent headaches, responding well to Aleve,  Add on ajovy monthly as preventive medications  Continue follow-up with primary care physician  DIAGNOSTIC DATA (LABS, IMAGING, TESTING) - I reviewed patient records, labs, notes, testing and imaging myself where available.  EEG on June 27 2020. 1.  This recording of the awake and drowsy state shows infrequent sharp wave activity involving the left anterior temporal region.  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  MRI brain w/wo in Nov 2021, was normal.  MRI of lumbar in May 2022, 1. Mild right subarticular and foraminal narrowing at L5-S1 secondary to a rightward disc protrusion. 2. Mild disc bulging and facet hypertrophy at L2-3, L3-4, and L4-5 without significant stenosis at these levels.  MRI of right hip on May 22, 2021 1.  No acute osseous injury of the right hip. 2. Small right superior labral tear. 3. Small amount of fluid in the greater trochanteric bursa bilaterally. 4. Lower lumbar spine spondylosis.   Laboratory evaluation in March 2022, normal liver function  test, TSH 2.5, CBC with hemoglobin of 13, A1C 5.4, triglycerides 230, LDL 104   MEDICAL HISTORY:  Tyronica Compton is a 71 year old female, seen in request by her primary care physician Dr. Legrand Rams, Brandon Melnick, for evaluation of possible seizure, she is accompanied by her significant other Heinz Knuckles at today's visit on June 20, 2021.  I reviewed and summarized the referring note.PMHx. HTN  She reported intermittent spells that has similar semiology since age 7s, she describes episode as starting off at the back of her neck, then spreading to left occipital region, bilateral shoulder, she felt the pressure building up, she has to stop to wait for his past, mild pressure pain lasting for few minutes, but made her feel bad the rest of the day, she did not lose consciousness with those episode, there was no body shaking episode, the only thing she can do when that happened is wait for it to pass,  For a while, she had frequent episode, it is usually triggered when standing up after prolonged sitting, she had it almost daily basis, especially around fall of 2021,  She was seen by local neurologist Dr. Janann Colonel, was diagnosed with possible seizure, based on abnormal EEG, I was not able to review tracing, EEG report by Dr. Janann Colonel in July 2021 described infrequent sharp waves at left anterior temporal region,  Reported normal MRI of the brain with without contrast in November 2022  She was treated with different antiepileptic medication since then, oftentimes cannot tolerate epileptic medications due to side effect Keppra,-groggy throughout the day Briviact October 2021, feeling tired, moody, angry easily Vimpat from October 2021 to March 2022, it seems  to help her symptoms, but after she got her COVID-vaccine in March 2022, she began to develop side effect taking Vimpat 50 mg twice a day, complains of dizziness, also worsening mood disorder," wanting to die", excessive sleepiness, and fatigue Kathryn Compton,  May 2022, feeling tired, increased depression, head pressure She was started on thin 10 mg every night since June 08, 2021, it makes her very tired sleepy at nighttime, singly during the day,  She continues to have similar spells, especially every time she got up," I got that feeling coming on", she will stand there for a few seconds, to let it pass,  She was even given prescription of diazepam nasal spray 20 mg dose, she used it few times, seems to help her symptoms some, but complains of sleepiness afterwards  UPDATE August 01 2022: She still has frequent dizziness spells, often triggered by leaning her neck backwards although getting up for prolonged study, complaints of transient tightness in the posterior cervical upper shoulder region, often spreading forward, mild pressure, decreased hearing, lightheadedness, if she holds still for 30 minutes the spell would pass, but the discomfort can linger around for couple hours, occasionally it is associated with headache,  She had 72 hours video EEG August 2022, many pushbutton event, but does not correlate with EEG changes, there was no significant abnormality found, occasionally bilateral frontotemporal sharp transients  Reviewed extensive cardiac evaluation, CT angiogram head and neck December 2022 showed no significant abnormality  Myocardial perfusion study November 2022, ejection fraction 69%, no ST deviation was noted, study was normal  Also reported extensive ENT evaluation including vestibular testing showed no significant abnormality  She complains of frequent migraine headaches, almost daily mild pressure headaches, but at least 3 times a week, she would have more severe headache with light noise sensitivity, have to take Aleve, lie down resting for few hours, Aleve works well most of the time,  She is already on propanolol 40 mg twice a day, Celexa 40 mg daily, trazodone 100 mg daily, she does wants to try preventive medication for to  better control her migraine, will start Ajovy    PHYSICAL EXAM:   Vitals:   08/01/22 1341  BP: (!) 146/83  Pulse: 65  Weight: 267 lb (121.1 kg)  Height: '5\' 7"'$  (1.702 m)   Sitting down 140/80  Standing up  140/80  Body mass index is 41.82 kg/m.  PHYSICAL EXAMNIATION:  Gen: NAD, conversant, well nourised, well groomed                     Cardiovascular: Regular rate rhythm, no peripheral edema, warm, nontender. Eyes: Conjunctivae clear without exudates or hemorrhage Neck: Supple, no carotid bruits. Pulmonary: Clear to auscultation bilaterally   NEUROLOGICAL EXAM:  MENTAL STATUS: Speech:    Speech is normal; fluent and spontaneous with normal comprehension.  Cognition:     Orientation to time, place and person     Normal recent and remote memory     Normal Attention span and concentration     Normal Language, naming, repeating,spontaneous speech     Fund of knowledge   CRANIAL NERVES: CN II: Visual fields are full to confrontation. Pupils are round equal and briskly reactive to light. CN III, IV, VI: extraocular movement are normal. No ptosis. CN V: Facial sensation is intact to light touch CN VII: Face is symmetric with normal eye closure  CN VIII: Hearing is normal to causal conversation. CN IX, X: Phonation is normal. CN XI: Head  turning and shoulder shrug are intact  MOTOR: There is no pronator drift of out-stretched arms. Muscle bulk and tone are normal. Muscle strength is normal.  REFLEXES: Reflexes are 2+ and symmetric at the biceps, triceps, knees, and ankles. Plantar responses are flexor.  SENSORY: Intact to light touch, pinprick and vibratory sensation are intact in fingers and toes.  COORDINATION: There is no trunk or limb dysmetria noted.  GAIT/STANCE: Need push-up to get up from seated position, antalgic  REVIEW OF SYSTEMS:  Full 14 system review of systems performed and notable only for as above All other review of systems were  negative.   ALLERGIES: Allergies  Allergen Reactions   Other Itching and Other (See Comments)    Antidepressants-Itching, Gaining Weight     HOME MEDICATIONS: Current Outpatient Medications  Medication Sig Dispense Refill   citalopram (CELEXA) 40 MG tablet Take 40 mg by mouth daily.     COLLAGEN PO Take by mouth.     famciclovir (FAMVIR) 500 MG tablet Take 500 mg by mouth 3 (three) times daily. Pt takes as needed.     fluticasone (CUTIVATE) 0.05 % cream Apply 1 application  topically daily.     meclizine (ANTIVERT) 25 MG tablet Take 25 mg by mouth every 8 (eight) hours as needed.     propranolol (INDERAL) 40 MG tablet Take 40 mg by mouth 2 (two) times daily.     traZODone (DESYREL) 100 MG tablet Take 100 mg by mouth at bedtime as needed.     No current facility-administered medications for this visit.    PAST MEDICAL HISTORY: Past Medical History:  Diagnosis Date   Anxiety    Depression    Hypertension    Motion sickness    Palpitations    Plantar fasciitis    Seizures ([place])    Vertigo     PAST SURGICAL HISTORY: Past Surgical History:  Procedure Laterality Date   BREAST REDUCTION SURGERY     COLONOSCOPY N/A 09/06/2019   Procedure: COLONOSCOPY;  Surgeon: Danie Binder, MD;  [facility];  Service: Endoscopy;  Laterality: N/A;  2:00   POLYPECTOMY  09/06/2019   Procedure: POLYPECTOMY;  Surgeon: Danie Binder, MD;  Location: [facility];  Service: Endoscopy;;   REDUCTION MAMMAPLASTY     TUBAL LIGATION      FAMILY HISTORY: Family History  Problem Relation Age of Onset   Other Mother        unsure of medical history   Other Father        unknown medical history   Drug abuse Brother    Suicidality Brother    Colon cancer Neg Hx     SOCIAL HISTORY: Social History   Socioeconomic History   Marital status: Single    Spouse name: Not on file   Number of children: 2   Years of education: 12   Highest education level: High school graduate   Occupational History   Occupation: Retired  Tobacco Use   Smoking status: Never   Smokeless tobacco: Never  Scientific laboratory technician Use: Never used  Substance and Sexual Activity   Alcohol use: Not Currently   Drug use: Never   Sexual activity: Yes    Birth control/protection: Surgical    Comment: tubal  Other Topics Concern   Not on file  Social History Narrative   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   Right-handed.   Lives with significant other.   One cup caffeine  per day, occasional glass of tea.   Social Determinants of Health   Financial Resource Strain: Medium Risk (08/01/2020)   Overall Financial Resource Strain (CARDIA)    Difficulty of Paying Living Expenses: Somewhat hard  Food Insecurity: No Food Insecurity (08/01/2020)   Hunger Vital Sign    Worried About Running Out of Food in the Last Year: Never true    Ran Out of Food in the Last Year: Never true  Transportation Needs: No Transportation Needs (08/01/2020)   PRAPARE - Hydrologist (Medical): No    Lack of Transportation (Non-Medical): No  Physical Activity: Inactive (08/01/2020)   Exercise Vital Sign    Days of Exercise per Week: 0 days    Minutes of Exercise per Session: 0 min  Stress: Stress Concern Present (08/01/2020)   [place]    Feeling of Stress : Very much  Social Connections: Socially Isolated (08/01/2020)   Social Connection and Isolation Panel [NHANES]    Frequency of Communication with Friends and Family: More than three times a week    Frequency of Social Gatherings with Friends and Family: More than three times a week    Attends Religious Services: Never    Marine scientist or Organizations: No    Attends Archivist Meetings: Never    Marital Status: Divorced  Human resources officer Violence: Not At Risk (08/01/2020)   Humiliation, Afraid, Rape, and Kick questionnaire     Fear of Current or Ex-Partner: No    Emotionally Abused: No    Physically Abused: No    Sexually Abused: No      Marcial Pacas, M.D. Ph.D.  [facility] [address] [place], [place] [address] Ph: [phone] Fax: [phone]  CC:  Carrolyn Meiers, MD [place],   [id number]  Carrolyn Meiers, MD

## 2022-08-06 DIAGNOSIS — M25561 Pain in right knee: Secondary | ICD-10-CM | POA: Diagnosis not present

## 2022-08-06 DIAGNOSIS — M9905 Segmental and somatic dysfunction of pelvic region: Secondary | ICD-10-CM | POA: Diagnosis not present

## 2022-08-06 DIAGNOSIS — M9906 Segmental and somatic dysfunction of lower extremity: Secondary | ICD-10-CM | POA: Diagnosis not present

## 2022-08-12 ENCOUNTER — Ambulatory Visit (INDEPENDENT_AMBULATORY_CARE_PROVIDER_SITE_OTHER): Payer: Medicare Other | Admitting: Clinical

## 2022-08-12 DIAGNOSIS — F411 Generalized anxiety disorder: Secondary | ICD-10-CM

## 2022-08-13 DIAGNOSIS — M9905 Segmental and somatic dysfunction of pelvic region: Secondary | ICD-10-CM | POA: Diagnosis not present

## 2022-08-13 DIAGNOSIS — M25561 Pain in right knee: Secondary | ICD-10-CM | POA: Diagnosis not present

## 2022-08-13 DIAGNOSIS — M9906 Segmental and somatic dysfunction of lower extremity: Secondary | ICD-10-CM | POA: Diagnosis not present

## 2022-08-13 NOTE — BH Specialist Note (Signed)
Integrated Behavioral Health via Telemedicine Visit  08/26/2022 Kathryn Compton [id number]  Number of [place] Clinician visits: Additional Visit  Session Start time: 3875   Session End time: 6433  Total time in minutes: 38   Referring Provider: Darron Doom, MD Patient/Family location: Home Kathryn Compton Provider location: [facility] [place] [facility]  All persons participating in visit: [name] and Kathryn Compton   Types of Service: Individual psychotherapy and Telephone visit  I connected with Kathryn Compton and/or [name] Compton's  n/a  via  Telephone or Video Enabled Telemedicine Application  (Video is Caregility application) and verified that I am speaking with the correct person using two identifiers. Discussed confidentiality: Yes   I discussed the limitations of telemedicine and the availability of in person appointments.  Discussed there is a possibility of technology failure and discussed alternative modes of communication if that failure occurs.  I discussed that engaging in this telemedicine visit, they consent to the provision of behavioral healthcare and the services will be billed under their insurance.  Patient and/or legal guardian expressed understanding and consented to Telemedicine visit: Yes   Presenting Concerns: Patient and/or family reports the following symptoms/concerns: Worry about stress-eating when boyfriend leaves to care for his mother after her cancer diagnosis.  Duration of problem: Recent increase; Severity of problem: moderate  Patient and/or Family's Strengths/Protective Factors: Social connections, Social and Emotional competence, Concrete supports in place (healthy food, safe environments, etc.), Sense of purpose, and Physical Health (exercise, healthy diet, medication compliance, etc.)  Goals Addressed: Patient will:  Reduce symptoms of: anxiety   Increase knowledge  and/or ability of: stress reduction   Demonstrate ability to: Increase healthy adjustment to current life circumstances  Progress towards Goals: Ongoing  Interventions: Interventions utilized:  Solution-Focused Strategies Standardized Assessments completed: Not Needed  Patient and/or Family Response: Patient agrees with treatment plan.   Assessment: Patient currently experiencing Generalized anxiety disorder.   Patient may benefit from continued therapeutic interventions.  Plan: Follow up with behavioral health clinician on : Two weeks Behavioral recommendations:  -Plan to remove Kathryn Compton's snacks prior to or soon after leaving to care for his mother -Continue daily workouts at the [facility] -Consider preparing healthy snacks ahead of time weekly if urge to stress eat occurs (ex. Carrot sticks, etc, as discussed) Referral(s): [place] (In Clinic)  I discussed the assessment and treatment plan with the patient and/or parent/guardian. They were provided an opportunity to ask questions and all were answered. They agreed with the plan and demonstrated an understanding of the instructions.   They were advised to call back or seek an in-person evaluation if the symptoms worsen or if the condition fails to improve as anticipated.  Kathryn Hamman Roscoe Witts, LCSW     04/19/2022    1:47 PM 02/12/2022   11:22 AM 01/15/2022   10:55 AM 09/28/2021   10:25 AM 09/11/2021    1:17 PM  Depression screen PHQ 2/9  Decreased Interest 0 1 0 1 2  Down, Depressed, Hopeless 0 1 0 3 2  PHQ - 2 Score 0 2 0 4 4  Altered sleeping  0 0 3 3  Tired, decreased energy  '1 1 3 3  '$ Change in appetite  0 0 1 2  Feeling bad or failure about yourself   '3 3 3 3  '$ Trouble concentrating  3 3 0 0  Moving slowly or fidgety/restless  0 0 0 0  Suicidal thoughts  0 0 1   PHQ-9 Score  '9 7 15 15      '$ 02/12/2022   11:24 AM 01/15/2022   10:58 AM 09/28/2021   10:23 AM 05/09/2021    3:28 PM  GAD 7 :  Generalized Anxiety Score  Nervous, Anxious, on Edge '1 1 3 2  '$ Control/stop worrying 0 0 0 2  Worry too much - different things 2 0 2 3  Trouble relaxing 0 0 3 2  Restless '3 3 3 2  '$ Easily annoyed or irritable 0 '1 1 3  '$ Afraid - awful might happen 0 0 0 2  Total GAD 7 Score '6 5 12 '$ 16

## 2022-08-14 ENCOUNTER — Ambulatory Visit (INDEPENDENT_AMBULATORY_CARE_PROVIDER_SITE_OTHER): Payer: Medicare Other | Admitting: Obstetrics & Gynecology

## 2022-08-14 ENCOUNTER — Encounter: Payer: Self-pay | Admitting: Obstetrics & Gynecology

## 2022-08-14 VITALS — BP 107/60 | HR 63 | Ht 67.0 in | Wt 264.0 lb

## 2022-08-14 DIAGNOSIS — R87618 Other abnormal cytological findings on specimens from cervix uteri: Secondary | ICD-10-CM

## 2022-08-14 DIAGNOSIS — G4733 Obstructive sleep apnea (adult) (pediatric): Secondary | ICD-10-CM | POA: Diagnosis not present

## 2022-08-14 DIAGNOSIS — L659 Nonscarring hair loss, unspecified: Secondary | ICD-10-CM | POA: Diagnosis not present

## 2022-08-14 DIAGNOSIS — N951 Menopausal and female climacteric states: Secondary | ICD-10-CM

## 2022-08-14 DIAGNOSIS — R7303 Prediabetes: Secondary | ICD-10-CM | POA: Diagnosis not present

## 2022-08-14 DIAGNOSIS — E569 Vitamin deficiency, unspecified: Secondary | ICD-10-CM | POA: Diagnosis not present

## 2022-08-14 DIAGNOSIS — E559 Vitamin D deficiency, unspecified: Secondary | ICD-10-CM | POA: Diagnosis not present

## 2022-08-14 DIAGNOSIS — R5382 Chronic fatigue, unspecified: Secondary | ICD-10-CM | POA: Diagnosis not present

## 2022-08-14 DIAGNOSIS — Z8639 Personal history of other endocrine, nutritional and metabolic disease: Secondary | ICD-10-CM | POA: Diagnosis not present

## 2022-08-14 NOTE — Progress Notes (Signed)
   GYN VISIT Patient name: Kathryn Compton MRN [id number]  Date of birth: 05-23-1951 Chief Complaint:   Hot Flashes  History of Present Illness:   Kathryn Compton is a 71 y.o. [phone] PM female being seen today for the following concerns:  -Getting hot flashes- started in June.  Most noticeable with activity.  Notes increased sweating.  Feels like she is burning from the inside out, very intense.  They typically lasts for up to an hour.  Denies night sweats.  Denies vaginal bleeding, discharge, itching or irritation.   No other acute gyn concerns.  No LMP recorded. Patient is postmenopausal.     04/19/2022    1:47 PM 02/12/2022   11:22 AM 01/15/2022   10:55 AM 09/28/2021   10:25 AM 09/11/2021    1:17 PM  Depression screen PHQ 2/9  Decreased Interest 0 1 0 1 2  Down, Depressed, Hopeless 0 1 0 3 2  PHQ - 2 Score 0 2 0 4 4  Altered sleeping  0 0 3 3  Tired, decreased energy  '1 1 3 3  '$ Change in appetite  0 0 1 2  Feeling bad or failure about yourself   '3 3 3 3  '$ Trouble concentrating  3 3 0 0  Moving slowly or fidgety/restless  0 0 0 0  Suicidal thoughts  0 0 1   PHQ-9 Score  '9 7 15 15     '$ Review of Systems:   Pertinent items are noted in HPI Denies fever/chills, dizziness, headaches, visual disturbances, fatigue, shortness of breath, chest pain, abdominal pain, vomiting. Pertinent History Reviewed:  Reviewed past medical,surgical, social, obstetrical and family history.  Reviewed problem list, medications and allergies. Physical Assessment:   Vitals:   08/14/22 1449  BP: 107/60  Pulse: 63  Weight: 264 lb (119.7 kg)  Height: '5\' 7"'$  (1.702 m)  Body mass index is 41.35 kg/m.       Physical Examination:   General appearance: alert, well appearing, and in no distress  Psych: mood appropriate, normal affect  Skin: warm & dry   Cardiovascular: normal heart rate noted  Respiratory: normal respiratory effort, no distress  Pelvic: pt declined  Chaperone: N/A     Assessment & Plan:  1) Vasomotor symptoms -discussed herbal supplements vs HRT -reviewed risk/benefit of each option -of note upon further questioning, it sounds as though she was previously on HRT and was advised to discontinue this medication -plan for trial of supplements, f/u in 55mo  2) Cervical dysplasia -recent pap 2021- Pap normal, HPV + other '[]'$  pt due for repeat pap and did not want to complete today    Return in about 3 months (around 11/13/2022) for annual/hot flash follow up.   JJanyth Pupa DO Attending [facility]

## 2022-08-16 ENCOUNTER — Telehealth: Payer: Self-pay | Admitting: Neurology

## 2022-08-16 NOTE — Telephone Encounter (Signed)
Pt is wanting to know what the update is on the authorization for her Fremanezumab-vfrm (AJOVY) 225 MG/1.5ML SOAJ Please advise.

## 2022-08-19 MED ORDER — AIMOVIG 70 MG/ML ~~LOC~~ SOAJ: 70.0000 mg | SUBCUTANEOUS | 11 refills | Status: DC

## 2022-08-19 NOTE — Telephone Encounter (Signed)
Meds ordered this encounter  Medications   Erenumab-aooe (AIMOVIG) 70 MG/ML SOAJ    Sig: Inject 70 mg into the skin every 30 (thirty) days.    Dispense:  1.12 mL    Refill:  11

## 2022-08-19 NOTE — Telephone Encounter (Signed)
I attempted PA this morning. Pt's insurance prefers [employer] or Aimovig.  Will send to MD for review.

## 2022-08-21 ENCOUNTER — Telehealth: Payer: Self-pay

## 2022-08-21 NOTE — Telephone Encounter (Signed)
A PA for Aimovig Sureclick '70MG'$ /ML 1 [employer] has been started on CMM.  (Key: [id number]). This request has received a Favorable outcome.

## 2022-08-22 ENCOUNTER — Encounter: Payer: Self-pay | Admitting: Dietician

## 2022-08-22 ENCOUNTER — Encounter: Payer: Medicare Other | Attending: General Surgery | Admitting: Dietician

## 2022-08-22 VITALS — Ht 67.0 in | Wt 270.0 lb

## 2022-08-22 DIAGNOSIS — I10 Essential (primary) hypertension: Secondary | ICD-10-CM | POA: Insufficient documentation

## 2022-08-22 DIAGNOSIS — E559 Vitamin D deficiency, unspecified: Secondary | ICD-10-CM | POA: Diagnosis not present

## 2022-08-22 DIAGNOSIS — Z6841 Body Mass Index (BMI) 40.0 and over, adult: Secondary | ICD-10-CM | POA: Insufficient documentation

## 2022-08-22 DIAGNOSIS — G4733 Obstructive sleep apnea (adult) (pediatric): Secondary | ICD-10-CM | POA: Diagnosis not present

## 2022-08-22 DIAGNOSIS — R5382 Chronic fatigue, unspecified: Secondary | ICD-10-CM | POA: Insufficient documentation

## 2022-08-22 DIAGNOSIS — L659 Nonscarring hair loss, unspecified: Secondary | ICD-10-CM | POA: Diagnosis not present

## 2022-08-22 DIAGNOSIS — Z713 Dietary counseling and surveillance: Secondary | ICD-10-CM | POA: Insufficient documentation

## 2022-08-22 NOTE — Progress Notes (Signed)
Supervised Weight Loss for Bariatric Surgery Appt start time: 1320 end time:  1350  SWL Appointment 5 of 6   Anthropometrics Start Weight at [facility]: 267.9lbs  (04/19/22) Weight: 270.0lbs Height: 5'7"  BMI: 42.29  Clinical Medications: no changes since previous visit  Health/ medical history changes: no changes  Dietary/ Lifestyle Progress: Patient reports some fluid retention due to allergic reaction to MSG recently Continues with 2 days a week of only liquid nutrition, but no further weight loss at this time Was planning to increase to 3 days a week but has not done so.  Her partner will be going out of state for about 2 months to care for mother with cancer, patient is concerned this will affect her eating habits since she will be alone and perhaps dealing with increased stress or boredom. She also voices concern that the weight loss surgery will not work.    Dietary intake: Eating Pattern: 3 meals and 0-1 snacks daily Dining out:   Breakfast: meal replacement shake with veg, fruit and protein supplement  Snack: none  Lunch: recently no lunch; occasionally sandwich or protein + veg + starch Snack: none Dinner: roasted veg zucch onion, peppers, tomatoes and carrots with rotini pasta Snack: small "taste" of partner's sweet snack Beverages: water, coffee,   Usual physical activity: strength training and cardio at gym 60 minutes 3x a week + additional walking 2x a week    Intervention:   Nutrition counseling for weight loss prior to upcoming bariatric surgery. Dicussed factors affecting weight loss, ways the surgery helps, and statistical advantage of surgery particularly when diet alone does not work.  Discussed emotional eating and strategies for managing.                Nutritional Diagnosis:  [place]-3.3 Overweight/obesity related to history of excess calories and inadequate physical activity as evidenced by patient with current BMI of 42.29, following dietary guidelines for  weight loss prior to bariatric surgery.               Teaching Method Utilized:  Animator provided: 101 things to do besides eating handout Tips for managing food cravings handout  Learning Readiness:  Change in progress  Barriers to learning/adherence to lifestyle change: none  Demonstrated degree of understanding via:  Teach Back   Plan:   return for supervised weight loss 09/19/22

## 2022-08-25 DIAGNOSIS — I10 Essential (primary) hypertension: Secondary | ICD-10-CM | POA: Diagnosis not present

## 2022-08-26 ENCOUNTER — Ambulatory Visit (INDEPENDENT_AMBULATORY_CARE_PROVIDER_SITE_OTHER): Payer: Medicare Other | Admitting: Clinical

## 2022-08-26 DIAGNOSIS — F411 Generalized anxiety disorder: Secondary | ICD-10-CM | POA: Diagnosis not present

## 2022-08-28 ENCOUNTER — Ambulatory Visit: Payer: Medicare Other | Admitting: Dietician

## 2022-08-28 NOTE — BH Specialist Note (Signed)
Integrated Behavioral Health via Telemedicine Visit  09/11/2022 Aaralyn Kil [id number]  Number of [place] Clinician visits: Additional Visit  Session Start time: 6568   Session End time: 1129  Total time in minutes: 41  Referring Provider: Darron Doom, MD Patient/Family location: Home [facility] Provider location: [facility] [place] [facility]  All persons participating in visit: Patient Sary Bogie and [place]   Types of Service: Individual psychotherapy and Telephone visit  I connected with [name] and/or [name] Moorman's  n/a  via  Telephone or Video Enabled Telemedicine Application  (Video is Caregility application) and verified that I am speaking with the correct person using two identifiers. Discussed confidentiality: Yes   I discussed the limitations of telemedicine and the availability of in person appointments.  Discussed there is a possibility of technology failure and discussed alternative modes of communication if that failure occurs.  I discussed that engaging in this telemedicine visit, they consent to the provision of behavioral healthcare and the services will be billed under their insurance.  Patient and/or legal guardian expressed understanding and consented to Telemedicine visit: Yes   Presenting Concerns: Patient and/or family reports the following symptoms/concerns: Lack of motivation and "in a funk" after boyfriend left to care for his mother with terminal cancer; toes and knee healing up from an accident/fall at the airport on the day he left.  Duration of problem: Recent event about two weeks.  Severity of problem: moderate  Patient and/or Family's Strengths/Protective Factors: Social connections, Social and Emotional competence, Concrete supports in place (healthy food, safe environments, etc.), Sense of purpose, and Physical Health (exercise, healthy diet, medication  compliance, etc.)  Goals Addressed: Patient will:  Reduce symptoms of: anxiety, depression, and stress   Demonstrate ability to: Increase healthy adjustment to current life circumstances  Progress towards Goals: Revised  Interventions: Interventions utilized:  Motivational Interviewing and Supportive Reflection Standardized Assessments completed: Not Needed  Patient and/or Family Response: Patient agrees with treatment plan.   Assessment: Patient currently experiencing Generalized anxiety disorder.   Patient may benefit from continued therapeutic interventions.  Plan: Follow up with behavioral health clinician on : Two weeks Behavioral recommendations:  -Continue plan to attend first physical therapy appointment for knee tomorrow -Consider deciding upon date to return to [facility], after talking to the PT -Continue working on crochet projects (blanket for Anthony's mother; hat for neighbor child) -Consider kindness to self during this time Referral(s): [place] (In Clinic)  I discussed the assessment and treatment plan with the patient and/or parent/guardian. They were provided an opportunity to ask questions and all were answered. They agreed with the plan and demonstrated an understanding of the instructions.   They were advised to call back or seek an in-person evaluation if the symptoms worsen or if the condition fails to improve as anticipated.  [place], LCSW     04/19/2022    1:47 PM 02/12/2022   11:22 AM 01/15/2022   10:55 AM 09/28/2021   10:25 AM 09/11/2021    1:17 PM  Depression screen PHQ 2/9  Decreased Interest 0 1 0 1 2  Down, Depressed, Hopeless 0 1 0 3 2  PHQ - 2 Score 0 2 0 4 4  Altered sleeping  0 0 3 3  Tired, decreased energy  '1 1 3 3  '$ Change in appetite  0 0 1 2  Feeling bad or failure about yourself   '3 3 3 '$ 3  Trouble concentrating  3 3 0 0  Moving slowly or fidgety/restless  0 0 0 0  Suicidal thoughts  0 0 1    PHQ-9 Score  '9 7 15 15      '$ 02/12/2022   11:24 AM 01/15/2022   10:58 AM 09/28/2021   10:23 AM 05/09/2021    3:28 PM  GAD 7 : Generalized Anxiety Score  Nervous, Anxious, on Edge '1 1 3 2  '$ Control/stop worrying 0 0 0 2  Worry too much - different things 2 0 2 3  Trouble relaxing 0 0 3 2  Restless '3 3 3 2  '$ Easily annoyed or irritable 0 '1 1 3  '$ Afraid - awful might happen 0 0 0 2  Total GAD 7 Score '6 5 12 '$ 16

## 2022-08-29 DIAGNOSIS — L820 Inflamed seborrheic keratosis: Secondary | ICD-10-CM | POA: Diagnosis not present

## 2022-08-29 DIAGNOSIS — B078 Other viral warts: Secondary | ICD-10-CM | POA: Diagnosis not present

## 2022-09-02 ENCOUNTER — Other Ambulatory Visit: Payer: Self-pay | Admitting: *Deleted

## 2022-09-02 DIAGNOSIS — M9906 Segmental and somatic dysfunction of lower extremity: Secondary | ICD-10-CM | POA: Diagnosis not present

## 2022-09-02 DIAGNOSIS — M25561 Pain in right knee: Secondary | ICD-10-CM | POA: Diagnosis not present

## 2022-09-02 DIAGNOSIS — M9905 Segmental and somatic dysfunction of pelvic region: Secondary | ICD-10-CM | POA: Diagnosis not present

## 2022-09-02 MED ORDER — PROPRANOLOL HCL 40 MG PO TABS: 40.0000 mg | ORAL_TABLET | Freq: Two times a day (BID) | ORAL | 6 refills | Status: DC

## 2022-09-10 ENCOUNTER — Ambulatory Visit: Payer: Medicaid Other | Admitting: Neurology

## 2022-09-11 ENCOUNTER — Ambulatory Visit (INDEPENDENT_AMBULATORY_CARE_PROVIDER_SITE_OTHER): Payer: Medicare Other | Admitting: Clinical

## 2022-09-11 DIAGNOSIS — F411 Generalized anxiety disorder: Secondary | ICD-10-CM | POA: Diagnosis not present

## 2022-09-12 ENCOUNTER — Ambulatory Visit (HOSPITAL_COMMUNITY): Payer: Medicare Other | Attending: Internal Medicine | Admitting: Physical Therapy

## 2022-09-12 ENCOUNTER — Encounter (HOSPITAL_COMMUNITY): Payer: Self-pay | Admitting: Physical Therapy

## 2022-09-12 DIAGNOSIS — M25561 Pain in right knee: Secondary | ICD-10-CM | POA: Insufficient documentation

## 2022-09-12 DIAGNOSIS — M25562 Pain in left knee: Secondary | ICD-10-CM | POA: Diagnosis not present

## 2022-09-12 NOTE — BH Specialist Note (Addendum)
Integrated Behavioral Health via Telemedicine Visit  09/25/2022 Kathryn Compton [id number]  Number of [place] Clinician visits: Additional Visit  Session Start time: 3267   Session End time: 1129  Total time in minutes: 41   Referring Provider: Darron Doom, MD Patient/Family location: Home [facility] Provider location: [facility] [place] [facility]  All persons participating in visit: [name] and Kathryn Compton   Types of Service: Individual psychotherapy and Telephone visit  I connected with Kathryn Compton and/or Kathryn Compton's  n/a  via  Telephone or Video Enabled Telemedicine Application  (Video is Caregility application) and verified that I am speaking with the correct person using two identifiers. Discussed confidentiality: Yes   I discussed the limitations of telemedicine and the availability of in person appointments.  Discussed there is a possibility of technology failure and discussed alternative modes of communication if that failure occurs.  I discussed that engaging in this telemedicine visit, they consent to the provision of behavioral healthcare and the services will be billed under their insurance.  Patient and/or legal guardian expressed understanding and consented to Telemedicine visit: Yes   Presenting Concerns: Patient and/or family reports the following symptoms/concerns: Increasing frustration and depressed mood over current life circumstances; poor appetite and low motivation.  Duration of problem: Increasing over time; Severity of problem: moderate  Patient and/or Family's Strengths/Protective Factors: Social connections, Concrete supports in place (healthy food, safe environments, etc.), and Sense of purpose  Goals Addressed: Patient will:  Reduce symptoms of: anxiety, depression, and stress    Demonstrate ability to: Increase motivation to adhere to plan of care  Progress  towards Goals: Ongoing  Interventions: Interventions utilized:  Motivational Interviewing and Supportive Reflection Standardized Assessments completed: Not Needed  Patient and/or Family Response: Patient agrees with treatment plan.   Assessment: Patient currently experiencing Generalized anxiety disorder and Adjustment disorder with depressed mood.   Patient may benefit from continued therapeutic interventions.  Plan: Follow up with behavioral health clinician on : Call Kathryn Compton at [phone], as needed. Behavioral recommendations:  -Continue plan to shower/shave today; go to bed on time, to prepare for visit with orthopedics in the morning (9am appt)  Referral(s): [place] (In Clinic)  I discussed the assessment and treatment plan with the patient and/or parent/guardian. They were provided an opportunity to ask questions and all were answered. They agreed with the plan and demonstrated an understanding of the instructions.   They were advised to call back or seek an in-person evaluation if the symptoms worsen or if the condition fails to improve as anticipated.  Kathryn Hamman Kadajah Kjos, LCSW

## 2022-09-12 NOTE — Therapy (Signed)
OUTPATIENT PHYSICAL THERAPY LOWER EXTREMITY EVALUATION   Patient Name: Kathryn Compton MRN: [id number] DOB:03-Nov-1951, 71 y.o., female Today's Date: 09/12/2022   PT End of Session - 09/12/22 1345     Visit Number 1    Number of Visits 8    Date for PT Re-Evaluation 10/10/22    Authorization Type UHC Medicare/ Medicaid 2ndary    PT Start Time 1305    PT Stop Time 1345    PT Time Calculation (min) 40 min    Activity Tolerance Patient tolerated treatment well    Behavior During Therapy WFL for tasks assessed/performed             Past Medical History:  Diagnosis Date   Anxiety    Depression    Hypertension    Motion sickness    Palpitations    Plantar fasciitis    Seizures ([place])    Vertigo    Past Surgical History:  Procedure Laterality Date   BREAST REDUCTION SURGERY     COLONOSCOPY N/A 09/06/2019   Procedure: COLONOSCOPY;  Surgeon: Danie Binder, MD;  [facility];  Service: Endoscopy;  Laterality: N/A;  2:00   POLYPECTOMY  09/06/2019   Procedure: POLYPECTOMY;  Surgeon: Danie Binder, MD;  [facility];  Service: Endoscopy;;   REDUCTION MAMMAPLASTY     TUBAL LIGATION     Patient Active Problem List   Diagnosis Date Noted   Chronic migraine w/o aura w/o status migrainosus, not intractable 08/01/2022   Morbid obesity ([place]) 07/31/2021   Pressure in head 06/19/2021   Essential hypertension 11/09/2020   Hip pain 11/09/2020   Papanicolaou smear of cervix with positive high risk human papilloma virus (HPV) test 08/01/2020   Encounter for screening fecal occult blood testing 08/01/2020   Routine medical exam 08/01/2020   Encounter for gynecological examination with Papanicolaou smear of cervix 08/01/2020   Abnormal gait 07/27/2020   Difficulty balancing 07/27/2020   Dizziness 07/27/2020   Sleep apnea 07/27/2020   Insomnia 06/01/2020   Obstructive sleep apnea of adult 06/01/2020   Vitamin D deficiency 05/31/2020   Anxiety disorder  04/20/2020   Palpitations 04/20/2020   Sacroiliac joint pain 04/20/2020   Seizure ([place]) 04/20/2020   Syncope and collapse 04/20/2020   Special screening for malignant neoplasms, colon     PCP: Tesfaye Janell Quiet MD  REFERRING PROVIDER: Georgeanna Harrison MD   REFERRING DIAG: Pt Eval And Tx For Right Knee Pain   THERAPY DIAG:  Right knee pain, unspecified chronicity  Left knee pain, unspecified chronicity  Rationale for Evaluation and Treatment Rehabilitation  ONSET DATE: Chronic  SUBJECTIVE:   SUBJECTIVE STATEMENT: Patient presents to therapy with complaint of chronic bilateral knee pain. She states RT> LT. She has had issues with her knees since childhood. She has not had any knee surgeries but has had recent xrays and MRI which showed degeneration and meniscus tear. She also reports that she fell on her knees about [place] at the airport. She manages symptoms with pain medication. She had some injections which ultimately made pain wore. She also uses ice PRN for pain.   PERTINENT HISTORY: Chronic knee pain   PAIN:  Are you having pain? Yes: NPRS scale: 8 (at worst)/10 Pain location: bilateral knees  Pain description: dull, sharp, aching  Aggravating factors: "relaxing and then getting up" Relieving factors: ice, walking   PRECAUTIONS: None  WEIGHT BEARING RESTRICTIONS No  FALLS:  Has patient fallen in last 6 months?  Yes. Number of falls 1  LIVING ENVIRONMENT: Lives with: lives with their family and lives with their spouse Lives in: House/apartment Stairs:  single step threshhold  Has following equipment at home: None  OCCUPATION: Retired   PLOF: Independent  PATIENT GOALS Have less pain    OBJECTIVE:   DIAGNOSTIC FINDINGS: None available  PATIENT SURVEYS:  FOTO 77% function   COGNITION:  Overall cognitive status: Within functional limits for tasks assessed     LOWER EXTREMITY ROM:  Active ROM Right eval Left eval  Hip flexion    Hip  extension    Hip abduction    Hip adduction    Hip internal rotation    Hip external rotation    Knee flexion -4 0  Knee extension 124 127  Ankle dorsiflexion    Ankle plantarflexion    Ankle inversion    Ankle eversion     (Blank rows = not tested)  LOWER EXTREMITY MMT:  MMT Right eval Left eval  Hip flexion 5 4+  Hip extension 4- 4  Hip abduction 4 4+  Hip adduction    Hip internal rotation    Hip external rotation    Knee flexion    Knee extension 5 5  Ankle dorsiflexion 5 5  Ankle plantarflexion    Ankle inversion    Ankle eversion     (Blank rows = not tested)    FUNCTIONAL TESTS:  2 minute walk test: 565 feet   GAIT: Distance walked: 565 feet  Assistive device utilized: None Level of assistance: Complete Independence Comments: unremarkable     TODAY'S TREATMENT: Eval  2 MWT HEP development    PATIENT EDUCATION:  Education details: on eval findings, POC and HEP  Person educated: Patient Education method: Explanation Education comprehension: verbalized understanding   HOME EXERCISE PROGRAM: Access Code: [email or web address] URL: [email or web address] Date: 09/12/2022 Prepared by: Josue Hector  Exercises - Supine Quad Set  - 2 x daily - 7 x weekly - 1 sets - 10 reps - 5 second hold - Active Straight Leg Raise with Quad Set  - 2 x daily - 7 x weekly - 2 sets - 10 reps - Sidelying Hip Abduction  - 2 x daily - 7 x weekly - 2 sets - 10 reps - Supine Bridge  - 2 x daily - 7 x weekly - 2 sets - 10 reps  ASSESSMENT:  CLINICAL IMPRESSION: Patient is a 71 y.o. female who presents to physical therapy with complaint of bilateral knee pain. Patient demonstrates muscle weakness, reduced ROM, and decreased activity tolerance which are likely contributing to symptoms of pain and are negatively impacting patient ability to perform ADLs and functional mobility tasks. Patient will benefit from skilled physical therapy services to address these deficits  to reduce pain and improve level of function with ADLs and functional mobility tasks.    OBJECTIVE IMPAIRMENTS decreased activity tolerance, decreased strength, and pain.   ACTIVITY LIMITATIONS standing, squatting, stairs, and locomotion level  PARTICIPATION LIMITATIONS: cleaning, laundry, shopping, community activity, and yard work  PERSONAL FACTORS Past/current experiences and Time since onset of injury/illness/exacerbation are also affecting patient's functional outcome.   REHAB POTENTIAL: Good  CLINICAL DECISION MAKING: Stable/uncomplicated  EVALUATION COMPLEXITY: Low   GOALS: SHORT TERM GOALS: Target date: 09/26/2022  Patient will be independent with initial HEP and self-management strategies to improve functional outcomes Baseline:  Goal status: INITIAL    LONG TERM GOALS: Target date: 10/10/2022  Patient will be independent with advanced HEP and  self-management strategies to improve functional outcomes Baseline:  Goal status: INITIAL  2.  Patient will improve FOTO score by 5% to indicate improvement in functional outcomes Baseline: 77% Goal status: INITIAL  3.  Patient will report at least 75% overall improvement in subjective complaint to indicate improvement in ability to perform ADLs. Baseline:  Goal status: INITIAL  4. Patient will have equal to or > 4+/5 MMT throughout BLE to improve ability to perform functional mobility, stair ambulation and ADLs.  Baseline: See MMT Goal status: INITIAL  5. Patient will report reduction of knee pain to <3/10 with activity for improved quality of life and ability to perform ADLs Baseline: 8/10 Goal status: INITIAL  PLAN: PT FREQUENCY: 1-2x/week  PT DURATION: 4 weeks  PLANNED INTERVENTIONS: Therapeutic exercises, Therapeutic activity, Neuromuscular re-education, Balance training, Gait training, Patient/Family education, Joint manipulation, Joint mobilization, Stair training, Aquatic Therapy, Dry Needling, Electrical  stimulation, Spinal manipulation, Spinal mobilization, Cryotherapy, Moist heat, scar mobilization, Taping, Traction, Ultrasound, Biofeedback, Ionotophoresis '4mg'$ /ml Dexamethasone, and Manual therapy.   PLAN FOR NEXT SESSION: Progress glute and quad strength as tolerated. Update weekly HEP for exercise program at [facility].   1:53 PM, 09/12/22 Josue Hector PT DPT  Physical Therapist with [facility]  [phone]

## 2022-09-16 DIAGNOSIS — M533 Sacrococcygeal disorders, not elsewhere classified: Secondary | ICD-10-CM | POA: Diagnosis not present

## 2022-09-16 DIAGNOSIS — M47816 Spondylosis without myelopathy or radiculopathy, lumbar region: Secondary | ICD-10-CM | POA: Diagnosis not present

## 2022-09-19 ENCOUNTER — Ambulatory Visit (HOSPITAL_COMMUNITY)
Admission: RE | Admit: 2022-09-19 | Discharge: 2022-09-19 | Disposition: A | Discharge status: Home / Self Care | Payer: Medicare Other | Source: Ambulatory Visit | Attending: Gerontology | Admitting: Gerontology

## 2022-09-19 ENCOUNTER — Ambulatory Visit (HOSPITAL_COMMUNITY): Payer: Medicare Other

## 2022-09-19 ENCOUNTER — Encounter: Payer: Self-pay | Admitting: Dietician

## 2022-09-19 ENCOUNTER — Encounter: Payer: Medicare Other | Attending: General Surgery | Admitting: Dietician

## 2022-09-19 ENCOUNTER — Other Ambulatory Visit (HOSPITAL_COMMUNITY): Payer: Self-pay | Admitting: Gerontology

## 2022-09-19 VITALS — Ht 67.0 in | Wt 268.0 lb

## 2022-09-19 DIAGNOSIS — M25562 Pain in left knee: Secondary | ICD-10-CM

## 2022-09-19 DIAGNOSIS — R5382 Chronic fatigue, unspecified: Secondary | ICD-10-CM | POA: Insufficient documentation

## 2022-09-19 DIAGNOSIS — I10 Essential (primary) hypertension: Secondary | ICD-10-CM | POA: Insufficient documentation

## 2022-09-19 DIAGNOSIS — Z713 Dietary counseling and surveillance: Secondary | ICD-10-CM | POA: Diagnosis not present

## 2022-09-19 DIAGNOSIS — G4733 Obstructive sleep apnea (adult) (pediatric): Secondary | ICD-10-CM | POA: Insufficient documentation

## 2022-09-19 DIAGNOSIS — M79672 Pain in left foot: Secondary | ICD-10-CM | POA: Insufficient documentation

## 2022-09-19 DIAGNOSIS — M25561 Pain in right knee: Secondary | ICD-10-CM | POA: Diagnosis not present

## 2022-09-19 DIAGNOSIS — M79675 Pain in left toe(s): Secondary | ICD-10-CM | POA: Diagnosis not present

## 2022-09-19 NOTE — Therapy (Signed)
OUTPATIENT PHYSICAL THERAPY LOWER EXTREMITY TREATMENT   Patient Name: Kathryn Compton MRN: [id number] DOB:11/28/1951, 71 y.o., female Today's Date: 09/19/2022   PT End of Session - 09/19/22 1517     Visit Number 2    Number of Visits 8    Date for PT Re-Evaluation 10/10/22    Authorization Type UHC Medicare/ Medicaid 2ndary    PT Start Time 1516    PT Stop Time 1558    PT Time Calculation (min) 42 min    Activity Tolerance Patient tolerated treatment well    Behavior During Therapy WFL for tasks assessed/performed              Past Medical History:  Diagnosis Date   Anxiety    Depression    Hypertension    Motion sickness    Palpitations    Plantar fasciitis    Seizures ([place])    Vertigo    Past Surgical History:  Procedure Laterality Date   BREAST REDUCTION SURGERY     COLONOSCOPY N/A 09/06/2019   Procedure: COLONOSCOPY;  Surgeon: Danie Binder, MD;  [facility];  Service: Endoscopy;  Laterality: N/A;  2:00   POLYPECTOMY  09/06/2019   Procedure: POLYPECTOMY;  Surgeon: Danie Binder, MD;  [facility];  Service: Endoscopy;;   REDUCTION MAMMAPLASTY     TUBAL LIGATION     Patient Active Problem List   Diagnosis Date Noted   Chronic migraine w/o aura w/o status migrainosus, not intractable 08/01/2022   Morbid obesity ([place]) 07/31/2021   Pressure in head 06/19/2021   Essential hypertension 11/09/2020   Hip pain 11/09/2020   Papanicolaou smear of cervix with positive high risk human papilloma virus (HPV) test 08/01/2020   Encounter for screening fecal occult blood testing 08/01/2020   Routine medical exam 08/01/2020   Encounter for gynecological examination with Papanicolaou smear of cervix 08/01/2020   Abnormal gait 07/27/2020   Difficulty balancing 07/27/2020   Dizziness 07/27/2020   Sleep apnea 07/27/2020   Insomnia 06/01/2020   Obstructive sleep apnea of adult 06/01/2020   Vitamin D deficiency 05/31/2020   Anxiety disorder  04/20/2020   Palpitations 04/20/2020   Sacroiliac joint pain 04/20/2020   Seizure ([place]) 04/20/2020   Syncope and collapse 04/20/2020   Special screening for malignant neoplasms, colon     PCP: Tesfaye Janell Quiet MD  REFERRING PROVIDER: Georgeanna Harrison MD   REFERRING DIAG: Pt Eval And Tx For Right Knee Pain   THERAPY DIAG:  Right knee pain, unspecified chronicity  Left knee pain, unspecified chronicity  Rationale for Evaluation and Treatment Rehabilitation  ONSET DATE: Chronic  SUBJECTIVE:   SUBJECTIVE STATEMENT: Knees still hurting; at rest no pain.  Pain with kneeling and stairs. Toes getting x-rayed today; they still hurt from the fall.     Eval:Patient presents to therapy with complaint of chronic bilateral knee pain. She states RT> LT. She has had issues with her knees since childhood. She has not had any knee surgeries but has had recent xrays and MRI which showed degeneration and meniscus tear. She also reports that she [place] She manages symptoms with pain medication. She had some injections which ultimately made pain wore. She also uses ice PRN for pain.   PERTINENT HISTORY: Chronic knee pain   PAIN:  Are you having pain? Yes: NPRS scale: 8 (at worst)/10 Pain location: bilateral knees  Pain description: dull, sharp, aching  Aggravating factors: "  relaxing and then getting up" Relieving factors: ice, walking   PRECAUTIONS: None  WEIGHT BEARING RESTRICTIONS No  FALLS:  Has patient fallen in last 6 months? Yes. Number of falls 1  LIVING ENVIRONMENT: Lives with: lives with their family and lives with their spouse Lives in: House/apartment Stairs:  single step threshhold  Has following equipment at home: None  OCCUPATION: Retired   PLOF: Independent  PATIENT GOALS Have less pain    OBJECTIVE:   DIAGNOSTIC FINDINGS: None available  PATIENT SURVEYS:  FOTO 77% function   COGNITION:  Overall cognitive  status: Within functional limits for tasks assessed     LOWER EXTREMITY ROM:  Active ROM Right eval Left eval  Hip flexion    Hip extension    Hip abduction    Hip adduction    Hip internal rotation    Hip external rotation    Knee flexion -4 0  Knee extension 124 127  Ankle dorsiflexion    Ankle plantarflexion    Ankle inversion    Ankle eversion     (Blank rows = not tested)  LOWER EXTREMITY MMT:  MMT Right eval Left eval  Hip flexion 5 4+  Hip extension 4- 4  Hip abduction 4 4+  Hip adduction    Hip internal rotation    Hip external rotation    Knee flexion    Knee extension 5 5  Ankle dorsiflexion 5 5  Ankle plantarflexion    Ankle inversion    Ankle eversion     (Blank rows = not tested)    FUNCTIONAL TESTS:  2 minute walk test: 565 feet   GAIT: Distance walked: 565 feet  Assistive device utilized: None Level of assistance: Complete Independence Comments: unremarkable     TODAY'S TREATMENT: 09/19/22 Review of HEP and goals Supine: Quad set 5" x 10 SLR 2 x 10 each SAQ's 3 x 10 1# Bridge x 5 (painful low back)  Sitting: LAQ's 1# 3 x 10 Hip flexion 1# 2 x 10  Standing: 1# hip abduction 2 x 10 1# hip extension 2 x 10   Eval  2 MWT HEP development    PATIENT EDUCATION:  Education details: on eval findings, POC and HEP  Person educated: Patient Education method: Explanation Education comprehension: verbalized understanding   HOME EXERCISE PROGRAM: Access Code: [email or web address] URL: [email or web address] Date: 09/12/2022 Prepared by: Josue Hector  Exercises - Supine Quad Set  - 2 x daily - 7 x weekly - 1 sets - 10 reps - 5 second hold - Active Straight Leg Raise with Quad Set  - 2 x daily - 7 x weekly - 2 sets - 10 reps - Sidelying Hip Abduction  - 2 x daily - 7 x weekly - 2 sets - 10 reps - Supine Bridge  - 2 x daily - 7 x weekly - 2 sets - 10 reps  ASSESSMENT:  CLINICAL IMPRESSION: Today's session began with  review of HEP and goals.  Progressed to standing exercises today.  Patient painful with right quad set today and  had some low back pain with bridge.  Patient verbalizes agreement with set rehab goals.   Patient will benefit from skilled physical therapy services to address  deficits to reduce pain and improve level of function with ADLs and functional mobility tasks.    OBJECTIVE IMPAIRMENTS decreased activity tolerance, decreased strength, and pain.   ACTIVITY LIMITATIONS standing, squatting, stairs, and locomotion level  PARTICIPATION LIMITATIONS: cleaning, laundry, shopping, community activity, and yard  work  PERSONAL FACTORS Past/current experiences and Time since onset of injury/illness/exacerbation are also affecting patient's functional outcome.   REHAB POTENTIAL: Good  CLINICAL DECISION MAKING: Stable/uncomplicated  EVALUATION COMPLEXITY: Low   GOALS: SHORT TERM GOALS: Target date: 09/26/2022  Patient will be independent with initial HEP and self-management strategies to improve functional outcomes Baseline:  Goal status: IN PROGRESS    LONG TERM GOALS: Target date: 10/10/2022  Patient will be independent with advanced HEP and self-management strategies to improve functional outcomes Baseline:  Goal status: IN PROGRESS  2.  Patient will improve FOTO score by 5% to indicate improvement in functional outcomes Baseline: 77% Goal status: IN PROGRESS  3.  Patient will report at least 75% overall improvement in subjective complaint to indicate improvement in ability to perform ADLs. Baseline:  Goal status: IN PROGRESS  4. Patient will have equal to or > 4+/5 MMT throughout BLE to improve ability to perform functional mobility, stair ambulation and ADLs.  Baseline: See MMT Goal status: IN PROGRESS  5. Patient will report reduction of knee pain to <3/10 with activity for improved quality of life and ability to perform ADLs Baseline: 8/10 Goal status: IN  PROGRESS  PLAN: PT FREQUENCY: 1-2x/week  PT DURATION: 4 weeks  PLANNED INTERVENTIONS: Therapeutic exercises, Therapeutic activity, Neuromuscular re-education, Balance training, Gait training, Patient/Family education, Joint manipulation, Joint mobilization, Stair training, Aquatic Therapy, Dry Needling, Electrical stimulation, Spinal manipulation, Spinal mobilization, Cryotherapy, Moist heat, scar mobilization, Taping, Traction, Ultrasound, Biofeedback, Ionotophoresis '4mg'$ /ml Dexamethasone, and Manual therapy.   PLAN FOR NEXT SESSION: Progress glute and quad strength as tolerated. Update weekly HEP for exercise program at [facility].    3:56 PM, 09/19/22 Kathryn Compton Daryle Boyington MPT [facility] [phone]

## 2022-09-19 NOTE — Patient Instructions (Addendum)
Gradually reduce caffeine in coffee by mixing regular and decaf and gradually reduce the regular.  Continue to eat something every 3-5 hours during the day, including lean protein foods and some veg and fruits until starting pre-op diet 2 weeks prior to surgery.

## 2022-09-19 NOTE — Progress Notes (Signed)
Supervised Weight Loss for Bariatric Surgery Appt start time: 1130 end time:  1200  SWL Appointment 6 of 6   Anthropometrics Start Weight at [facility]: 267.9lbs  (04/19/22) Weight: 268.0lbs  Height: 5'7"  BMI: 41.97  Clinical Medications: citalopram, erenumab-aooe, famciclovir, fluticasone, meclizine, propranolol, traZODone, collagen Health/ medical history changes: foot injury  Dietary/ Lifestyle Progress: Recent foot injury due to fall Partner is out of town for unknown period of time, helping his mother who has stage 4 pancreatic cancer, so his mood and subsequently patient's mood is somewhat depressed currently.  She is not cooking many meals as she is living alone at this time, mostly light simple meals and some snacks. Physical activity limited due to injury   Dietary intake: Eating Pattern: 2-3 meals and 0-1snacks daily Dining out:   Breakfast: meal replacement shake with veg, fruit, and protein supplement  Snack:  fruit/ cheese Lunch: same as dinner; salad Snack: none or fruit or protein drink Dinner: [place] sandwich or pb; cottage cheese and fruit Snack: none Beverages: water, coffee, occ diet coke  Usual physical activity: limited recently due to food injury    Intervention:   Nutrition counseling for weight loss prior to upcoming bariatric surgery. Instructed on basics or pre-op diet Patient continues to limit caloric intake as well as starchy foods.  She receives support from partner. She voices understanding of necessary changes for the post-op diet and the importance of lifelong adherence to healthy eating and lifestyle habits.   Nutritionally she is a good candidate for bariatric surgery.               Nutritional Diagnosis:  [place]-3.3 Overweight/obesity related to history of excess calories and inadequate physical activity as evidenced by patient with current BMI of 41.97, following dietary guidelines for weight loss prior to bariatric surgery.                Teaching Method Utilized:  Visual Auditory Hands on  Materials provided: Visit summary with goals/ instructions  Learning Readiness:  Change in progress  Barriers to learning/adherence to lifestyle change: none  Demonstrated degree of understanding via:  Teach Back   Plan:   return for pre-op class at least 2 weeks prior to surgery Return for post-op MNT beginning 2 weeks after surgery

## 2022-09-20 ENCOUNTER — Ambulatory Visit: Payer: Medicare Other

## 2022-09-25 ENCOUNTER — Ambulatory Visit (INDEPENDENT_AMBULATORY_CARE_PROVIDER_SITE_OTHER): Payer: Medicare Other | Admitting: Clinical

## 2022-09-25 ENCOUNTER — Ambulatory Visit (HOSPITAL_COMMUNITY): Payer: Medicare Other | Admitting: Physical Therapy

## 2022-09-25 DIAGNOSIS — F411 Generalized anxiety disorder: Secondary | ICD-10-CM

## 2022-09-25 NOTE — Patient Instructions (Signed)
[facility] [address] [place], [place] [address] [phone] (main office) [phone] (Misty Foutz's office)   

## 2022-09-26 ENCOUNTER — Telehealth: Payer: Self-pay | Admitting: Orthopaedic Surgery

## 2022-09-26 ENCOUNTER — Ambulatory Visit (INDEPENDENT_AMBULATORY_CARE_PROVIDER_SITE_OTHER): Payer: Medicare Other | Admitting: Orthopaedic Surgery

## 2022-09-26 ENCOUNTER — Encounter: Payer: Self-pay | Admitting: Orthopaedic Surgery

## 2022-09-26 VITALS — BP 132/85 | HR 67 | Ht 67.0 in | Wt 268.0 lb

## 2022-09-26 DIAGNOSIS — S92525A Nondisplaced fracture of medial phalanx of left lesser toe(s), initial encounter for closed fracture: Secondary | ICD-10-CM | POA: Diagnosis not present

## 2022-09-26 NOTE — Patient Instructions (Addendum)
As the weather changes and gets cooler, you may notice you are affected more. You may have more pain in your joints. This is normal. Dress warmly and make sure that area is covered well.   My name is Abby and I work with Dr.Keeling. If you have any questions or concerns, just send me a mychart message. It is quicker to send a message via mychart, but you can call if you would like.   Dr.Keeling is here all day on Tuesdays, Wednesday mornings, and Thursday mornings. If you need anything such as a medication refill, please either call BEFORE the end of the day on [facility] or send a message through [place]. Your pharmacy can send a refill request for you. Calling by the end of the day on [facility] allows [place] time to send Dr.Keeling the request and for him to respond before he leaves on Thursdays.  If Dr. Luna Glasgow is out of the office, we may send it to one of the other providers and they may not refill it for the same amount that your original prescription is for.   "Buddy wrap" your second toe to the third toe and put gauze in between.

## 2022-09-26 NOTE — Telephone Encounter (Signed)
Patient inquired at time of initial visit with Dr Luna Glasgow today, 09/26/22, about being seen for a right knee problem, and presented with a copy of her MRI report from provider in [place].  Discussed 2nd opinion protocol, including obtaining provider's notes, Xray report/CD, and MRI CD, and to let us know when she has received this information, for Dr Luna Glasgow to review and advise.

## 2022-09-26 NOTE — Progress Notes (Signed)
Subjective:    Patient ID: Kathryn Compton, female    DOB: 07-19-1951, 71 y.o.   MRN: [id number]  HPI She hurt her foot on 08-28-22 at the [place] airport while taking a person to get on a plane.  She has had pain of the left second toe since then. She had swelling but it has gone down.  Her pain continued and she finally went to get an x-ray on 09-19-22.  It showed: IMPRESSION: Age indeterminate fracture at the base of the 2nd toe middle phalanx. Correlate with pain.   An appointment was made here.  She has really done nothing for the toe.  I have reviewed the X-rays.  The fracture is healing.  I have independently reviewed and interpreted x-rays of this patient done at another site by another physician or qualified health professional.    Review of Systems  Constitutional:  Positive for activity change.  Musculoskeletal:  Positive for gait problem.  All other systems reviewed and are negative. For Review of Systems, all other systems reviewed and are negative.  The following is a summary of the past history medically, past history surgically, known current medicines, social history and family history.  This information is gathered electronically by the computer from prior information and documentation.  I review this each visit and have found including this information at this point in the chart is beneficial and informative.   Past Medical History:  Diagnosis Date   Anxiety    Depression    Hypertension    Motion sickness    Palpitations    Plantar fasciitis    Seizures ([place])    Vertigo     Past Surgical History:  Procedure Laterality Date   BREAST REDUCTION SURGERY     COLONOSCOPY N/A 09/06/2019   Procedure: COLONOSCOPY;  Surgeon: Danie Binder, MD;  [facility];  Service: Endoscopy;  Laterality: N/A;  2:00   POLYPECTOMY  09/06/2019   Procedure: POLYPECTOMY;  Surgeon: Danie Binder, MD;  Location: [facility];  Service: Endoscopy;;   REDUCTION  MAMMAPLASTY     TUBAL LIGATION      Current Outpatient Medications on File Prior to Visit  Medication Sig Dispense Refill   citalopram (CELEXA) 40 MG tablet Take 40 mg by mouth daily.     COLLAGEN PO Take by mouth.     Erenumab-aooe (AIMOVIG) 70 MG/ML SOAJ Inject 70 mg into the skin every 30 (thirty) days. 1.12 mL 11   fluticasone (CUTIVATE) 0.05 % cream Apply 1 application  topically daily. (Patient not taking: Reported on 08/14/2022)     meclizine (ANTIVERT) 25 MG tablet Take 25 mg by mouth every 8 (eight) hours as needed. (Patient not taking: Reported on 08/14/2022)     propranolol (INDERAL) 40 MG tablet Take 1 tablet (40 mg total) by mouth 2 (two) times daily. 60 tablet 6   traZODone (DESYREL) 100 MG tablet Take 100 mg by mouth at bedtime as needed.     No current facility-administered medications on file prior to visit.    Social History   Socioeconomic History   Marital status: Single    Spouse name: Not on file   Number of children: 2   Years of education: 12   Highest education level: High school graduate  Occupational History   Occupation: Retired  Tobacco Use   Smoking status: Never   Smokeless tobacco: Never  Vaping Use   Vaping Use: Never used  Substance and Sexual Activity  Alcohol use: Not Currently   Drug use: Never   Sexual activity: Yes    Birth control/protection: Surgical    Comment: tubal  Other Topics Concern   Not on file  Social History Narrative   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   Right-handed.   Lives with significant other.   One cup caffeine per day, occasional glass of tea.   Social Determinants of Health   Financial Resource Strain: Medium Risk (08/01/2020)   Overall Financial Resource Strain (CARDIA)    Difficulty of Paying Living Expenses: Somewhat hard  Food Insecurity: No Food Insecurity (08/01/2020)   Hunger Vital Sign    Worried About Running Out of Food in the Last Year: Never true    Ran Out of Food in the  Last Year: Never true  Transportation Needs: No Transportation Needs (08/01/2020)   PRAPARE - Hydrologist (Medical): No    Lack of Transportation (Non-Medical): No  Physical Activity: Inactive (08/01/2020)   Exercise Vital Sign    Days of Exercise per Week: 0 days    Minutes of Exercise per Session: 0 min  Stress: Stress Concern Present (08/01/2020)   [place]    Feeling of Stress : Very much  Social Connections: Socially Isolated (08/01/2020)   Social Connection and Isolation Panel [NHANES]    Frequency of Communication with Friends and Family: More than three times a week    Frequency of Social Gatherings with Friends and Family: More than three times a week    Attends Religious Services: Never    Marine scientist or Organizations: No    Attends Archivist Meetings: Never    Marital Status: Divorced  Human resources officer Violence: Not At Risk (08/01/2020)   Humiliation, Afraid, Rape, and Kick questionnaire    Fear of Current or Ex-Partner: No    Emotionally Abused: No    Physically Abused: No    Sexually Abused: No    Family History  Problem Relation Age of Onset   Other Mother        unsure of medical history   Other Father        unknown medical history   Drug abuse Brother    Suicidality Brother    Colon cancer Neg Hx     BP 132/85   Pulse 67   Ht '5\' 7"'$  (1.702 m)   Wt 268 lb (121.6 kg)   BMI 41.97 kg/m   Body mass index is 41.97 kg/m.      Objective:   Physical Exam Vitals and nursing note reviewed. Exam conducted with a chaperone present.  Constitutional:      Appearance: She is well-developed.  HENT:     Head: Normocephalic and atraumatic.  Eyes:     Conjunctiva/sclera: Conjunctivae normal.     Pupils: Pupils are equal, round, and reactive to light.  Cardiovascular:     Rate and Rhythm: Normal rate and regular rhythm.  Pulmonary:     Effort:  Pulmonary effort is normal.  Abdominal:     Palpations: Abdomen is soft.  Musculoskeletal:     Cervical back: Normal range of motion and neck supple.       Feet:  Skin:    General: Skin is warm and dry.  Neurological:     Mental Status: She is alert and oriented to person, place, and time.     Cranial Nerves: No cranial nerve deficit.  Motor: No abnormal muscle tone.     Coordination: Coordination normal.     Deep Tendon Reflexes: Reflexes are normal and symmetric. Reflexes normal.  Psychiatric:        Behavior: Behavior normal.        Thought Content: Thought content normal.        Judgment: Judgment normal.           Assessment & Plan:   Encounter Diagnosis  Name Primary?   Closed nondisplaced fracture of middle phalanx of lesser toe of left foot, initial encounter Yes   I have shown her the X-rays and explained that the toe is healing.  I have told her about buddy taping toe and have done so.  The toe may have slight lateral tilt but it is too late to correct without surgery.  Return in three weeks.  Call if any problem.  Precautions discussed.  Electronically Signed Sanjuana Kava, MD 10/26/202310:00 AM

## 2022-09-30 ENCOUNTER — Encounter: Payer: Medicare Other | Attending: General Surgery | Admitting: Dietician

## 2022-09-30 ENCOUNTER — Encounter: Payer: Self-pay | Admitting: Dietician

## 2022-09-30 DIAGNOSIS — Z713 Dietary counseling and surveillance: Secondary | ICD-10-CM | POA: Insufficient documentation

## 2022-09-30 DIAGNOSIS — Z6841 Body Mass Index (BMI) 40.0 and over, adult: Secondary | ICD-10-CM | POA: Diagnosis not present

## 2022-09-30 DIAGNOSIS — E119 Type 2 diabetes mellitus without complications: Secondary | ICD-10-CM | POA: Insufficient documentation

## 2022-09-30 DIAGNOSIS — E669 Obesity, unspecified: Secondary | ICD-10-CM

## 2022-09-30 NOTE — Progress Notes (Signed)
Pre-Operative Nutrition Class:    Patient was seen on 09/30/2022 for Pre-Operative Bariatric Surgery Education at [facility].    Surgery date:  Surgery type: Sleeve Gastrectomy  Anthropometrics Start Weight at [facility]: 267.9lbs  (04/19/22) Height: 5'7"  Weight today: 267.4 lbs BMI: 41.88  Clinical Medications: no changes since previous visit  Health/ medical history changes: no changes  Samples given per MNT protocol. Patient educated on appropriate usage:   [place] Lot # [id number] Exp: 9/26   Bariatric Advantage Calcium  Lot # [id number] Exp:10/22/2022   Ensure Max Protein Shake Lot # [id number] Exp: [id number]  The following the learning objectives were met by the patient during this course: Identify Pre-Op Dietary Goals and will begin 2 weeks pre-operatively Identify appropriate sources of fluids and proteins  State protein recommendations and appropriate sources pre and post-operatively Identify Post-Operative Dietary Goals and will follow for 2 weeks post-operatively Identify appropriate multivitamin and calcium sources Describe the need for physical activity post-operatively and will follow MD recommendations State when to call healthcare provider regarding medication questions or post-operative complications When having a diagnosis of diabetes understanding hypoglycemia symptoms and the inclusion of 1 complex carbohydrate per meal  Handouts given during class include: Pre-Op Bariatric Surgery Diet Handout Protein Shake Handout Post-Op Bariatric Surgery Nutrition Handout BELT Program Information Flyer Support Group Information Flyer [facility] Bariatric Supplements Price List  Follow-Up Plan: Patient will follow-up at [facility] 2 weeks post operatively for diet advancement per MD.

## 2022-10-01 DIAGNOSIS — M533 Sacrococcygeal disorders, not elsewhere classified: Secondary | ICD-10-CM | POA: Diagnosis not present

## 2022-10-10 ENCOUNTER — Ambulatory Visit (INDEPENDENT_AMBULATORY_CARE_PROVIDER_SITE_OTHER): Payer: Medicare Other

## 2022-10-10 ENCOUNTER — Encounter: Payer: Self-pay | Admitting: Orthopaedic Surgery

## 2022-10-10 ENCOUNTER — Ambulatory Visit (INDEPENDENT_AMBULATORY_CARE_PROVIDER_SITE_OTHER): Payer: Medicare Other | Admitting: Orthopaedic Surgery

## 2022-10-10 VITALS — Ht 67.0 in | Wt 267.0 lb

## 2022-10-10 DIAGNOSIS — M25561 Pain in right knee: Secondary | ICD-10-CM | POA: Diagnosis not present

## 2022-10-10 DIAGNOSIS — S92525D Nondisplaced fracture of medial phalanx of left lesser toe(s), subsequent encounter for fracture with routine healing: Secondary | ICD-10-CM

## 2022-10-10 DIAGNOSIS — G8929 Other chronic pain: Secondary | ICD-10-CM

## 2022-10-10 DIAGNOSIS — S92525S Nondisplaced fracture of medial phalanx of left lesser toe(s), sequela: Secondary | ICD-10-CM | POA: Diagnosis not present

## 2022-10-10 NOTE — Patient Instructions (Addendum)
As the weather changes and gets cooler, you may notice you are affected more. You may have more pain in your joints. This is normal. Dress warmly and make sure that area is covered well.   Your toe will still hurt for a while. The bump will eventually go away.   We will work on approval for gel injections. We will call you to schedule you to come in for the injections when we get approval.

## 2022-10-10 NOTE — Progress Notes (Signed)
My foot is better but my knee is hurting.  She has less pain of the left foot second toe.    X-rays were done of the left foot, reported separately.  Her toe fracture on the left has healed.  She still has some tenderness.  Her main problem today is right knee pain.  She had MRI of the knee earlier this year with [facility].  I do not have the report and the CD she brought did not have the knee on it but other body parts.  She had cortisone injection in the right knee several months ago but it did not last more then two weeks.  I have discussed possibility of viscosupplementation.  She would like to try that.  I will have her return after we get permission from her insurance company for the viscosupplementation.   Right knee has crepitus, slight effusion, stable, ROM 0 to 110, slight limp right, NV intact, no distal edema.  Encounter Diagnoses  Name Primary?   Closed nondisplaced fracture of middle phalanx of lesser toe of left foot, sequela Yes   Chronic pain of right knee    Return for viscosupplementation.  Call if any problem.  Precautions discussed.  Electronically Signed Sanjuana Kava, MD 11/9/202311:06 AM

## 2022-10-16 ENCOUNTER — Other Ambulatory Visit: Payer: Self-pay | Admitting: Radiology

## 2022-10-16 DIAGNOSIS — M1711 Unilateral primary osteoarthritis, right knee: Secondary | ICD-10-CM

## 2022-10-16 NOTE — BH Specialist Note (Signed)
Integrated Behavioral Health via Telemedicine Visit  10/29/2022 Kathryn Compton [id number]  Number of [place] Clinician visits: Additional Visit  Session Start time: 0932   Session End time: 6712  Total time in minutes: 56   Referring Provider: Darron Doom, MD Patient/Family location: Home [facility] Provider location: [facility] [place] [facility]  All persons participating in visit: [name] and Kathryn Compton   Types of Service: Individual psychotherapy and Telephone visit  I connected with Kathryn Compton and/or Kathryn Compton's  n/a  via  Telephone or Video Enabled Telemedicine Application  (Video is Caregility application) and verified that I am speaking with the correct person using two identifiers. Discussed confidentiality: Yes   I discussed the limitations of telemedicine and the availability of in person appointments.  Discussed there is a possibility of technology failure and discussed alternative modes of communication if that failure occurs.  I discussed that engaging in this telemedicine visit, they consent to the provision of behavioral healthcare and the services will be billed under their insurance.  Patient and/or legal guardian expressed understanding and consented to Telemedicine visit: Yes   Presenting Concerns: Patient and/or family reports the following symptoms/concerns: Processing increased feelings of depression regarding recent life stress (Partner's mother passed, numerous car repairs, preparing for surgery); worry about going into surgery without partner present as he makes arrangements for his mother.  Duration of problem: Ongoing; Severity of problem: moderate  Patient and/or Family's Strengths/Protective Factors: Social connections, Concrete supports in place (healthy food, safe environments, etc.), and Sense of purpose  Goals Addressed: Patient will:  Reduce symptoms  of: anxiety, depression, and stress    Demonstrate ability to: Increase healthy adjustment to current life circumstances  Progress towards Goals: Ongoing  Interventions: Interventions utilized:  Solution-Focused Strategies Standardized Assessments completed: Not Needed  Patient and/or Family Response: Patient agrees with treatment plan.   Assessment: Patient currently experiencing Generalized anxiety disorder and Adjustment disorder with depressed mood.   Patient may benefit from continued therapeutic interventions.  Plan: Follow up with behavioral health clinician on : Two weeks Behavioral recommendations:  -Continue following pre-surgery diet for the next week -Prior to surgery, prepare home to care for self at home (post-surgery liquids stocked, book(s) ready, plan shows to watch during recovery, etc.) -Plan for transportation to and from surgery -Continue talking to partner daily by phone Referral(s): [place] (In Clinic)  I discussed the assessment and treatment plan with the patient and/or parent/guardian. They were provided an opportunity to ask questions and all were answered. They agreed with the plan and demonstrated an understanding of the instructions.   They were advised to call back or seek an in-person evaluation if the symptoms worsen or if the condition fails to improve as anticipated.  [place], LCSW     04/19/2022    1:47 PM 02/12/2022   11:22 AM 01/15/2022   10:55 AM 09/28/2021   10:25 AM 09/11/2021    1:17 PM  Depression screen PHQ 2/9  Decreased Interest 0 1 0 1 2  Down, Depressed, Hopeless 0 1 0 3 2  PHQ - 2 Score 0 2 0 4 4  Altered sleeping  0 0 3 3  Tired, decreased energy  '1 1 3 3  '$ Change in appetite  0 0 1 2  Feeling bad or failure about yourself   '3 3 3 3  '$ Trouble concentrating  3 3 0 0  Moving slowly or fidgety/restless  0 0 0 0  Suicidal thoughts  0 0 1   PHQ-9 Score  '9 7 15 15      '$ 02/12/2022    11:24 AM 01/15/2022   10:58 AM 09/28/2021   10:23 AM 05/09/2021    3:28 PM  GAD 7 : Generalized Anxiety Score  Nervous, Anxious, on Edge '1 1 3 2  '$ Control/stop worrying 0 0 0 2  Worry too much - different things 2 0 2 3  Trouble relaxing 0 0 3 2  Restless '3 3 3 2  '$ Easily annoyed or irritable 0 '1 1 3  '$ Afraid - awful might happen 0 0 0 2  Total GAD 7 Score '6 5 12 '$ 16

## 2022-10-17 ENCOUNTER — Ambulatory Visit: Payer: Self-pay | Admitting: General Surgery

## 2022-10-17 DIAGNOSIS — G4733 Obstructive sleep apnea (adult) (pediatric): Secondary | ICD-10-CM | POA: Diagnosis not present

## 2022-10-17 DIAGNOSIS — E569 Vitamin deficiency, unspecified: Secondary | ICD-10-CM | POA: Diagnosis not present

## 2022-10-23 ENCOUNTER — Ambulatory Visit: Payer: Medicare Other | Admitting: Orthopaedic Surgery

## 2022-10-23 NOTE — Progress Notes (Addendum)
Anesthesia Review:  PCP: Nash Dimmer  Cardiologist : DR Deretha Emory- LOV 05/10/22  Chest x-ray : 2v- 04/10/22  EKG : 05/10/22  Echo : Stress test: 10/12/21  Carotids- 07/19/21  Cardiac Cath :  Activity level: can do a flight of stairs without difficutly   Sleep Study/ CPAP : none  Fasting Blood Sugar :      / Checks Blood Sugar -- times a day:   Blood Thinner/ Instructions /Last Dose: ASA / Instructions/ Last Dose :

## 2022-10-28 DIAGNOSIS — I10 Essential (primary) hypertension: Secondary | ICD-10-CM | POA: Diagnosis not present

## 2022-10-28 NOTE — Patient Instructions (Addendum)
SURGICAL WAITING ROOM VISITATION Patients having surgery or a procedure may have no more than 2 support people in the waiting area - these visitors may rotate.   Children under the age of 65 must have an adult with them who is not the patient. If the patient needs to stay at the hospital during part of their recovery, the visitor guidelines for inpatient rooms apply. Pre-op nurse will coordinate an appropriate time for 1 support person to accompany patient in pre-op.  This support person may not rotate.    Please refer to the [facility] website for the visitor guidelines for Inpatients (after your surgery is over and you are in a regular room).       Your procedure is scheduled on:  11/05/2022    Report to [facility] Main Entrance    Report to admitting at   0900AM   Call this number if you have problems the morning of surgery [phone]   Do not eat food :After Midnight.   After Midnight you may have the following liquids until __ 0800____ AM/ PM DAY OF SURGERY  Water Non-Citrus Juices (without pulp, NO RED) Carbonated Beverages Black Coffee (NO MILK/CREAM OR CREAMERS, sugar ok)  Clear Tea (NO MILK/CREAM OR CREAMERS, sugar ok) regular and decaf                             Plain Jell-O (NO RED)                                           Fruit ices (not with fruit pulp, NO RED)                                     Popsicles (NO RED)                                                               Sports drinks like Gatorade (NO RED)                      The day of surgery:  Drink ONE (1) Pre-Surgery Clear Ensure or G2 at  0800   ( have completed by )  the morning of surgery. Drink in one sitting. Do not sip.  This drink was given to you during your hospital  pre-op appointment visit. Nothing else to drink after completing the  Pre-Surgery Clear Ensure or G2.          If you have questions, please contact your surgeon's office.        Oral Hygiene is also  important to reduce your risk of infection.                                    Remember - BRUSH YOUR TEETH THE MORNING OF SURGERY WITH YOUR REGULAR TOOTHPASTE   Do NOT smoke after Midnight   Take these medicines the morning of surgery with A SIP OF WATER:  celexa,  propanolol   DO NOT TAKE ANY ORAL DIABETIC MEDICATIONS DAY OF YOUR SURGERY  Bring CPAP mask and tubing day of surgery.                              You may not have any metal on your body including hair pins, jewelry, and body piercing             Do not wear make-up, lotions, powders, perfumes/cologne, or deodorant  Do not wear nail polish including gel and S&S, artificial/acrylic nails, or any other type of covering on natural nails including finger and toenails. If you have artificial nails, gel coating, etc. that needs to be removed by a nail salon please have this removed prior to surgery or surgery may need to be canceled/ delayed if the surgeon/ anesthesia feels like they are unable to be safely monitored.   Do not shave  48 hours prior to surgery.               Men may shave face and neck.   Do not bring valuables to the hospital. [place].   Contacts, dentures or bridgework may not be worn into surgery.   Bring small overnight bag day of surgery.   DO NOT [place]. PHARMACY WILL DISPENSE MEDICATIONS LISTED ON YOUR MEDICATION LIST TO YOU DURING YOUR ADMISSION [place]!    Patients discharged on the day of surgery will not be allowed to drive home.  Someone NEEDS to stay with you for the first 24 hours after anesthesia.   Special Instructions: Bring a copy of your healthcare power of attorney and living will documents the day of surgery if you haven't scanned them before.              Please read over the following fact sheets you were given: IF [place]  [phone]   If you received a COVID test during your pre-op visit  it is requested that you wear a mask when out in public, stay away from anyone that may not be feeling well and notify your surgeon if you develop symptoms. If you test positive for Covid or have been in contact with anyone that has tested positive in the last 10 days please notify you surgeon.    [place] - Preparing for Surgery Before surgery, you can play an important role.  Because skin is not sterile, your skin needs to be as free of germs as possible.  You can reduce the number of germs on your skin by washing with CHG (chlorahexidine gluconate) soap before surgery.  CHG is an antiseptic cleaner which kills germs and bonds with the skin to continue killing germs even after washing. Please DO NOT use if you have an allergy to CHG or antibacterial soaps.  If your skin becomes reddened/irritated stop using the CHG and inform your nurse when you arrive at Short Stay. Do not shave (including legs and underarms) for at least 48 hours prior to the first CHG shower.  You may shave your face/neck. Please follow these instructions carefully:  1.  Shower with CHG Soap the night before surgery and the  morning of Surgery.  2.  If you choose to wash your hair, wash your hair first  as usual with your  normal  shampoo.  3.  After you shampoo, rinse your hair and body thoroughly to remove the  shampoo.                           4.  Use CHG as you would any other liquid soap.  You can apply chg directly  to the skin and wash                       Gently with a scrungie or clean washcloth.  5.  Apply the CHG Soap to your body ONLY FROM THE NECK DOWN.   Do not use on face/ open                           Wound or open sores. Avoid contact with eyes, ears mouth and genitals (private parts).                       Wash face,  Genitals (private parts) with your normal soap.             6.  Wash thoroughly, paying special attention to the area  where your surgery  will be performed.  7.  Thoroughly rinse your body with warm water from the neck down.  8.  DO NOT shower/wash with your normal soap after using and rinsing off  the CHG Soap.                9.  Pat yourself dry with a clean towel.            10.  Wear clean pajamas.            11.  Place clean sheets on your bed the night of your first shower and do not  sleep with pets. Day of Surgery : Do not apply any lotions/deodorants the morning of surgery.  Please wear clean clothes to the hospital/surgery center.  FAILURE TO FOLLOW THESE INSTRUCTIONS MAY RESULT IN THE CANCELLATION OF YOUR SURGERY PATIENT SIGNATURE_________________________________  NURSE SIGNATURE__________________________________  ________________________________________________________________________

## 2022-10-29 ENCOUNTER — Ambulatory Visit (INDEPENDENT_AMBULATORY_CARE_PROVIDER_SITE_OTHER): Payer: Medicare Other | Admitting: Clinical

## 2022-10-29 DIAGNOSIS — F411 Generalized anxiety disorder: Secondary | ICD-10-CM | POA: Diagnosis not present

## 2022-10-29 DIAGNOSIS — F4321 Adjustment disorder with depressed mood: Secondary | ICD-10-CM

## 2022-10-30 ENCOUNTER — Encounter (HOSPITAL_COMMUNITY)
Admission: RE | Admit: 2022-10-30 | Discharge: 2022-10-30 | Disposition: A | Discharge status: Home / Self Care | Payer: Medicare Other | Source: Ambulatory Visit | Attending: General Surgery | Admitting: General Surgery

## 2022-10-30 ENCOUNTER — Encounter (HOSPITAL_COMMUNITY): Payer: Self-pay

## 2022-10-30 ENCOUNTER — Other Ambulatory Visit: Payer: Self-pay

## 2022-10-30 DIAGNOSIS — Z6841 Body Mass Index (BMI) 40.0 and over, adult: Secondary | ICD-10-CM | POA: Insufficient documentation

## 2022-10-30 DIAGNOSIS — Z01812 Encounter for preprocedural laboratory examination: Secondary | ICD-10-CM | POA: Insufficient documentation

## 2022-10-30 DIAGNOSIS — I10 Essential (primary) hypertension: Secondary | ICD-10-CM | POA: Diagnosis not present

## 2022-10-30 HISTORY — DX: Headache, unspecified: R51.9

## 2022-10-30 HISTORY — DX: Gastro-esophageal reflux disease without esophagitis: K21.9

## 2022-10-30 HISTORY — DX: Unspecified osteoarthritis, unspecified site: M19.90

## 2022-10-30 LAB — COMPREHENSIVE METABOLIC PANEL
ALT: 23 U/L (ref 0–44)
AST: 22 U/L (ref 15–41)
Albumin: 3.8 g/dL (ref 3.5–5.0)
Alkaline Phosphatase: 60 U/L (ref 38–126)
Anion gap: 7 (ref 5–15)
BUN: 33 mg/dL — ABNORMAL HIGH (ref 8–23)
CO2: 27 mmol/L (ref 22–32)
Calcium: 9.4 mg/dL (ref 8.9–10.3)
Chloride: 107 mmol/L (ref 98–111)
Creatinine, Ser: 0.7 mg/dL (ref 0.44–1.00)
GFR, Estimated: >60 mL/min (ref >60)
Glucose, Bld: 91 mg/dL (ref 70–99)
Potassium: 4.7 mmol/L (ref 3.5–5.1)
Sodium: 141 mmol/L (ref 135–145)
Total Bilirubin: 0.7 mg/dL (ref 0.3–1.2)
Total Protein: 7.5 g/dL (ref 6.5–8.1)

## 2022-10-30 LAB — CBC WITH DIFFERENTIAL/PLATELET
Abs Immature Granulocytes: 0.04 10*3/uL (ref 0.00–0.07)
Basophils Absolute: 0.1 10*3/uL (ref 0.0–0.1)
Basophils Relative: 1 %
Eosinophils Absolute: 0.2 10*3/uL (ref 0.0–0.5)
Eosinophils Relative: 2 %
HCT: 43.3 % (ref 36.0–46.0)
Hemoglobin: 13.4 g/dL (ref 12.0–15.0)
Immature Granulocytes: 0 %
Lymphocytes Relative: 23 %
Lymphs Abs: 2.3 10*3/uL (ref 0.7–4.0)
MCH: 27.4 pg (ref 26.0–34.0)
MCHC: 30.9 g/dL (ref 30.0–36.0)
MCV: 88.5 fL (ref 80.0–100.0)
Monocytes Absolute: 0.8 10*3/uL (ref 0.1–1.0)
Monocytes Relative: 8 %
Neutro Abs: 6.6 10*3/uL (ref 1.7–7.7)
Neutrophils Relative %: 66 %
Platelets: 354 10*3/uL (ref 150–400)
RBC: 4.89 MIL/uL (ref 3.87–5.11)
RDW: 15.5 % (ref 11.5–15.5)
WBC: 10 10*3/uL (ref 4.0–10.5)
nRBC: 0 % (ref 0.0–0.2)

## 2022-10-31 NOTE — Progress Notes (Signed)
Anesthesia Chart Review   Case: [id number] Date/Time: 11/05/22 1045   Procedures:      LAPAROSCOPIC SLEEVE GASTRECTOMY     UPPER GI ENDOSCOPY   Anesthesia type: General   Pre-op diagnosis: MORBID OBESITY   Location: [facility] ROOM 02 / [facility]   Surgeons: Kinsinger, Arta Bruce, MD       DISCUSSION:71 y.o. never smoker with h/o HTN, palpitations, morbid obesity scheduled for above procedure 11/05/2022 with Dr. Gurney Maxin.   Pt seen by cardiology 05/10/2022.  Per OV note,  "- 10/2021 stress test was normal - ok for bariatric surgery if indicated from cardiac standpoint"  Anticipate pt can proceed with planned procedure barring acute status change.   VS: BP (!) 145/80   Pulse 75   Temp 36.9 C (Oral)   Resp 16   Ht '5\' 7"'$  (1.702 m)   Wt 121.6 kg   SpO2 95%   BMI 41.97 kg/m   PROVIDERS: Carrolyn Meiers, MD is PCP   Carlyle Dolly, MD is Cardiologist  LABS: Labs reviewed: Acceptable for surgery. (all labs ordered are listed, but only abnormal results are displayed)  Labs Reviewed  COMPREHENSIVE METABOLIC PANEL - Abnormal; Notable for the following components:      Result Value   BUN 33 (*)    All other components within normal limits  CBC WITH DIFFERENTIAL/PLATELET  TYPE AND SCREEN     IMAGES:   EKG:   CV: Myocardial Perfusion 10/12/2021   The study is normal. There are no perfusion defects consistent with prior infarct or current ischemia.  The study is low risk.   No ST deviation was noted.   Left ventricular function is normal. Nuclear stress EF: 69 %. The left ventricular ejection fraction is hyperdynamic (>65%). End diastolic cavity size is normal. Past Medical History:  Diagnosis Date   Anxiety    Arthritis    Depression    GERD (gastroesophageal reflux disease)    Headache    Hypertension    Motion sickness    Palpitations    Plantar fasciitis    Seizures (HCC)    Vertigo     Past Surgical History:  Procedure Laterality Date   BREAST  REDUCTION SURGERY     COLONOSCOPY N/A 09/06/2019   Procedure: COLONOSCOPY;  Surgeon: Danie Binder, MD;  Location: [facility] SU[facility];  Service: Endoscopy;  Laterality: N/A;  2:00   POLYPECTOMY  09/06/2019   Procedure: POLYPECTOMY;  Surgeon: Danie Binder, MD;  Location: [facility];  Service: Endoscopy;;   REDUCTION MAMMAPLASTY     TUBAL LIGATION      MEDICATIONS:  CALCIUM PO   citalopram (CELEXA) 20 MG tablet   citalopram (CELEXA) 40 MG tablet   COLLAGEN PO   Erenumab-aooe (AIMOVIG) 70 MG/ML SOAJ   fluticasone (CUTIVATE) 0.05 % cream   hydrOXYzine (ATARAX) 25 MG tablet   ketotifen (ZADITOR) 0.035 % ophthalmic solution   Multiple Vitamin (MULTIVITAMIN WITH MINERALS) TABS tablet   naproxen sodium (ALEVE) 220 MG tablet   propranolol (INDERAL) 40 MG tablet   VITAMIN D PO   No current facility-administered medications for this encounter.      Konrad Felix Ward, PA-C [facility] Pre-Surgical Testing [phone]

## 2022-11-04 NOTE — Anesthesia Preprocedure Evaluation (Signed)
Anesthesia Evaluation  Patient identified by MRN, date of birth, ID band Patient awake    Reviewed: Allergy & Precautions, NPO status , Patient's Chart, lab work & pertinent test results  Airway Mallampati: III  TM Distance: >3 FB Neck ROM: Full    Dental no notable dental hx. (+) Dental Advisory Given, Teeth Intact   Pulmonary sleep apnea    Pulmonary exam normal breath sounds clear to auscultation       Cardiovascular hypertension, Pt. on home beta blockers  Rhythm:Regular Rate:Normal  Stress MPS 10/2021   The study is normal. There are no perfusion defects consistent with prior infarct or current ischemia.  The study is low risk.   No ST deviation was noted.   Left ventricular function is normal. Nuclear stress EF: 69 %. The left ventricular ejection fraction is hyperdynamic (>65%). End diastolic cavity size is normal.    Neuro/Psych  Headaches, Seizures -,  PSYCHIATRIC DISORDERS Anxiety Depression       GI/Hepatic Neg liver ROS,GERD  ,,  Endo/Other    Morbid obesity  Renal/GU negative Renal ROS     Musculoskeletal  (+) Arthritis ,    Abdominal  (+) + obese  Peds  Hematology negative hematology ROS (+)   Anesthesia Other Findings   Reproductive/Obstetrics                             Anesthesia Physical Anesthesia Plan  ASA: 3  Anesthesia Plan: General   Post-op Pain Management: Tylenol PO (pre-op)* and Gabapentin PO (pre-op)*   Induction: Intravenous  PONV Risk Score and Plan: 4 or greater and Ondansetron, Dexamethasone, Aprepitant, Treatment may vary due to age or medical condition, Midazolam and Scopolamine patch - Pre-op  Airway Management Planned: Oral ETT  Additional Equipment:   Intra-op Plan:   Post-operative Plan: Extubation in OR  Informed Consent: I have reviewed the patients History and Physical, chart, labs and discussed the procedure including the risks,  benefits and alternatives for the proposed anesthesia with the patient or authorized representative who has indicated his/her understanding and acceptance.     Dental advisory given  Plan Discussed with: CRNA  Anesthesia Plan Comments:         Anesthesia Quick Evaluation

## 2022-11-04 NOTE — BH Specialist Note (Deleted)
Integrated Behavioral Health via Telemedicine Visit  11/04/2022 Kathryn Compton [id number]  Number of [place] Clinician visits: Additional Visit  Session Start time: 9024   Session End time: 0973  Total time in minutes: 56   Referring Provider: *** Patient/Family location: [facility] Provider location: *** All persons participating in visit: *** Types of Service: {CHL AMB TYPE OF SERVICE:[phone]}  I connected with Kathryn Compton and/or Kathryn Compton's {family members:20773} via  Telephone or Video Enabled Telemedicine Application  (Video is Caregility application) and verified that I am speaking with the correct person using two identifiers. Discussed confidentiality: {YES/NO:21197}  I discussed the limitations of telemedicine and the availability of in person appointments.  Discussed there is a possibility of technology failure and discussed alternative modes of communication if that failure occurs.  I discussed that engaging in this telemedicine visit, they consent to the provision of behavioral healthcare and the services will be billed under their insurance.  Patient and/or legal guardian expressed understanding and consented to Telemedicine visit: {YES/NO:21197}  Presenting Concerns: Patient and/or family reports the following symptoms/concerns: *** Duration of problem: ***; Severity of problem: {Mild/Moderate/Severe:20260}  Patient and/or Family's Strengths/Protective Factors: {CHL AMB BH PROTECTIVE FACTORS:[phone]}  Goals Addressed: Patient will:  Reduce symptoms of: {IBH Symptoms:21014056}   Increase knowledge and/or ability of: {IBH Patient Tools:21014057}   Demonstrate ability to: {IBH Goals:21014053}  Progress towards Goals: {CHL AMB BH PROGRESS TOWARDS GOALS:[phone]}  Interventions: Interventions utilized:  {IBH Interventions:21014054} Standardized Assessments completed: {IBH Screening Tools:21014051}  Patient and/or  Family Response: ***  Assessment: Patient currently experiencing ***.   Patient may benefit from ***.  Plan: Follow up with behavioral health clinician on : *** Behavioral recommendations: *** Referral(s): {IBH Referrals:21014055}  I discussed the assessment and treatment plan with the patient and/or parent/guardian. They were provided an opportunity to ask questions and all were answered. They agreed with the plan and demonstrated an understanding of the instructions.   They were advised to call back or seek an in-person evaluation if the symptoms worsen or if the condition fails to improve as anticipated.  Caroleen Hamman Tamim Skog, LCSW

## 2022-11-05 ENCOUNTER — Other Ambulatory Visit: Payer: Self-pay

## 2022-11-05 ENCOUNTER — Inpatient Hospital Stay (HOSPITAL_COMMUNITY): Payer: Medicare Other | Admitting: Physician Assistant

## 2022-11-05 ENCOUNTER — Inpatient Hospital Stay (HOSPITAL_COMMUNITY)
Admission: RE | Admit: 2022-11-05 | Discharge: 2022-11-07 | DRG: 621 | Disposition: A | Discharge status: Home / Self Care | Payer: Medicare Other | Attending: General Surgery | Admitting: General Surgery

## 2022-11-05 ENCOUNTER — Inpatient Hospital Stay (HOSPITAL_COMMUNITY): Payer: Medicare Other | Admitting: Anesthesiology

## 2022-11-05 ENCOUNTER — Encounter (HOSPITAL_COMMUNITY): Payer: Self-pay | Admitting: General Surgery

## 2022-11-05 ENCOUNTER — Encounter (HOSPITAL_COMMUNITY)
Admission: RE | Disposition: A | Discharge status: Home / Self Care | Payer: Self-pay | Source: Home / Self Care | Attending: General Surgery

## 2022-11-05 DIAGNOSIS — I10 Essential (primary) hypertension: Secondary | ICD-10-CM | POA: Diagnosis not present

## 2022-11-05 DIAGNOSIS — E569 Vitamin deficiency, unspecified: Secondary | ICD-10-CM | POA: Diagnosis present

## 2022-11-05 DIAGNOSIS — Z6841 Body Mass Index (BMI) 40.0 and over, adult: Secondary | ICD-10-CM | POA: Diagnosis not present

## 2022-11-05 DIAGNOSIS — G4733 Obstructive sleep apnea (adult) (pediatric): Secondary | ICD-10-CM | POA: Diagnosis present

## 2022-11-05 DIAGNOSIS — G4730 Sleep apnea, unspecified: Secondary | ICD-10-CM

## 2022-11-05 DIAGNOSIS — E669 Obesity, unspecified: Secondary | ICD-10-CM | POA: Diagnosis present

## 2022-11-05 DIAGNOSIS — F419 Anxiety disorder, unspecified: Secondary | ICD-10-CM | POA: Diagnosis present

## 2022-11-05 DIAGNOSIS — Z79899 Other long term (current) drug therapy: Secondary | ICD-10-CM

## 2022-11-05 HISTORY — PX: LAPAROSCOPIC GASTRIC SLEEVE RESECTION: SHX5895

## 2022-11-05 HISTORY — PX: UPPER GI ENDOSCOPY: SHX6162

## 2022-11-05 LAB — CBC
HCT: 41.3 % (ref 36.0–46.0)
Hemoglobin: 13.1 g/dL (ref 12.0–15.0)
MCH: 27.8 pg (ref 26.0–34.0)
MCHC: 31.7 g/dL (ref 30.0–36.0)
MCV: 87.7 fL (ref 80.0–100.0)
Platelets: 274 10*3/uL (ref 150–400)
RBC: 4.71 MIL/uL (ref 3.87–5.11)
RDW: 15.6 % — ABNORMAL HIGH (ref 11.5–15.5)
WBC: 13.1 10*3/uL — ABNORMAL HIGH (ref 4.0–10.5)
nRBC: 0 % (ref 0.0–0.2)

## 2022-11-05 LAB — TYPE AND SCREEN
ABO/RH(D): B POS
Antibody Screen: NEGATIVE

## 2022-11-05 LAB — ABO/RH: ABO/RH(D): B POS

## 2022-11-05 LAB — CREATININE, SERUM
Creatinine, Ser: 0.77 mg/dL (ref 0.44–1.00)
GFR, Estimated: >60 mL/min (ref >60)

## 2022-11-05 SURGERY — GASTRECTOMY, SLEEVE, LAPAROSCOPIC
Anesthesia: General

## 2022-11-05 MED ORDER — EPHEDRINE 5 MG/ML INJ
INTRAVENOUS | Status: AC
  Filled 2022-11-05: qty 5

## 2022-11-05 MED ORDER — HYDRALAZINE HCL 20 MG/ML IJ SOLN: 10.0000 mg | INTRAMUSCULAR | Status: DC | PRN

## 2022-11-05 MED ORDER — LACTATED RINGERS IV SOLN: INTRAVENOUS | Status: DC

## 2022-11-05 MED ORDER — PROPRANOLOL HCL 10 MG PO TABS
40.0000 mg | ORAL_TABLET | Freq: Two times a day (BID) | ORAL | Status: DC
  Administered 2022-11-05 – 2022-11-06 (×2): 40 mg via ORAL
  Filled 2022-11-05 (×3): qty 4

## 2022-11-05 MED ORDER — BUPIVACAINE HCL (PF) 0.5 % IJ SOLN
INTRAMUSCULAR | Status: AC
  Filled 2022-11-05: qty 30

## 2022-11-05 MED ORDER — PROMETHAZINE HCL 25 MG/ML IJ SOLN: 6.2500 mg | INTRAMUSCULAR | Status: DC | PRN

## 2022-11-05 MED ORDER — ROCURONIUM BROMIDE 10 MG/ML (PF) SYRINGE
PREFILLED_SYRINGE | INTRAVENOUS | Status: DC | PRN
  Administered 2022-11-05: 20 mg via INTRAVENOUS
  Administered 2022-11-05: 70 mg via INTRAVENOUS

## 2022-11-05 MED ORDER — HYDROMORPHONE HCL 1 MG/ML IJ SOLN
INTRAMUSCULAR | Status: AC
  Filled 2022-11-05: qty 1

## 2022-11-05 MED ORDER — DEXAMETHASONE SODIUM PHOSPHATE 4 MG/ML IJ SOLN: 4.0000 mg | INTRAMUSCULAR | Status: DC

## 2022-11-05 MED ORDER — DEXTROSE-NACL 5-0.45 % IV SOLN: INTRAVENOUS | Status: DC

## 2022-11-05 MED ORDER — SCOPOLAMINE 1 MG/3DAYS TD PT72
1.0000 | MEDICATED_PATCH | TRANSDERMAL | Status: DC
  Administered 2022-11-05: 1.5 mg via TRANSDERMAL
  Filled 2022-11-05: qty 1

## 2022-11-05 MED ORDER — HEPARIN SODIUM (PORCINE) 5000 UNIT/ML IJ SOLN
5000.0000 [IU] | INTRAMUSCULAR | Status: AC
  Administered 2022-11-05: 5000 [IU] via SUBCUTANEOUS
  Filled 2022-11-05: qty 1

## 2022-11-05 MED ORDER — PHENYLEPHRINE HCL (PRESSORS) 10 MG/ML IV SOLN
INTRAVENOUS | Status: AC
  Filled 2022-11-05: qty 1

## 2022-11-05 MED ORDER — BUPIVACAINE LIPOSOME 1.3 % IJ SUSP
INTRAMUSCULAR | Status: DC | PRN
  Administered 2022-11-05: 20 mL

## 2022-11-05 MED ORDER — FENTANYL CITRATE (PF) 250 MCG/5ML IJ SOLN
INTRAMUSCULAR | Status: DC | PRN
  Administered 2022-11-05 (×2): 50 ug via INTRAVENOUS

## 2022-11-05 MED ORDER — SIMETHICONE 80 MG PO CHEW
80.0000 mg | CHEWABLE_TABLET | Freq: Four times a day (QID) | ORAL | Status: DC | PRN
  Administered 2022-11-05 – 2022-11-06 (×4): 80 mg via ORAL
  Filled 2022-11-05 (×5): qty 1

## 2022-11-05 MED ORDER — SUGAMMADEX SODIUM 500 MG/5ML IV SOLN
INTRAVENOUS | Status: DC | PRN
  Administered 2022-11-05: 250 mg via INTRAVENOUS

## 2022-11-05 MED ORDER — STERILE WATER FOR IRRIGATION IR SOLN
Status: DC | PRN
  Administered 2022-11-05 (×2): 1000 mL

## 2022-11-05 MED ORDER — HYDROMORPHONE HCL 1 MG/ML IJ SOLN
0.2500 mg | INTRAMUSCULAR | Status: DC | PRN
  Administered 2022-11-05 (×2): 0.5 mg via INTRAVENOUS

## 2022-11-05 MED ORDER — ENSURE MAX PROTEIN PO LIQD
2.0000 [oz_av] | ORAL | Status: DC
  Administered 2022-11-06 – 2022-11-07 (×9): 2 [oz_av] via ORAL

## 2022-11-05 MED ORDER — DEXAMETHASONE SODIUM PHOSPHATE 10 MG/ML IJ SOLN
INTRAMUSCULAR | Status: AC
  Filled 2022-11-05: qty 1

## 2022-11-05 MED ORDER — ACETAMINOPHEN 160 MG/5ML PO SOLN: 1000.0000 mg | Freq: Three times a day (TID) | ORAL | Status: DC

## 2022-11-05 MED ORDER — EPHEDRINE SULFATE-NACL 50-0.9 MG/10ML-% IV SOSY
PREFILLED_SYRINGE | INTRAVENOUS | Status: DC | PRN
  Administered 2022-11-05: 10 mg via INTRAVENOUS
  Administered 2022-11-05 (×2): 5 mg via INTRAVENOUS

## 2022-11-05 MED ORDER — BUPIVACAINE LIPOSOME 1.3 % IJ SUSP
INTRAMUSCULAR | Status: AC
  Filled 2022-11-05: qty 20

## 2022-11-05 MED ORDER — CHLORHEXIDINE GLUCONATE CLOTH 2 % EX PADS: 6.0000 | MEDICATED_PAD | Freq: Once | CUTANEOUS | Status: DC

## 2022-11-05 MED ORDER — FENTANYL CITRATE (PF) 250 MCG/5ML IJ SOLN
INTRAMUSCULAR | Status: AC
  Filled 2022-11-05: qty 5

## 2022-11-05 MED ORDER — HEPARIN SODIUM (PORCINE) 5000 UNIT/ML IJ SOLN
5000.0000 [IU] | Freq: Three times a day (TID) | INTRAMUSCULAR | Status: DC
  Administered 2022-11-05 – 2022-11-07 (×5): 5000 [IU] via SUBCUTANEOUS
  Filled 2022-11-05 (×5): qty 1

## 2022-11-05 MED ORDER — 0.9 % SODIUM CHLORIDE (POUR BTL) OPTIME
TOPICAL | Status: DC | PRN
  Administered 2022-11-05: 1000 mL

## 2022-11-05 MED ORDER — MIDAZOLAM HCL 2 MG/2ML IJ SOLN
INTRAMUSCULAR | Status: AC
  Filled 2022-11-05: qty 2

## 2022-11-05 MED ORDER — FAMOTIDINE IN NACL 20-0.9 MG/50ML-% IV SOLN
20.0000 mg | Freq: Two times a day (BID) | INTRAVENOUS | Status: DC
  Administered 2022-11-05 – 2022-11-06 (×3): 20 mg via INTRAVENOUS
  Filled 2022-11-05 (×3): qty 50

## 2022-11-05 MED ORDER — CHLORHEXIDINE GLUCONATE 0.12 % MT SOLN
15.0000 mL | Freq: Once | OROMUCOSAL | Status: AC
  Administered 2022-11-05: 15 mL via OROMUCOSAL

## 2022-11-05 MED ORDER — AMISULPRIDE (ANTIEMETIC) 5 MG/2ML IV SOLN
INTRAVENOUS | Status: AC
  Filled 2022-11-05: qty 4

## 2022-11-05 MED ORDER — AMISULPRIDE (ANTIEMETIC) 5 MG/2ML IV SOLN
10.0000 mg | Freq: Once | INTRAVENOUS | Status: AC | PRN
  Administered 2022-11-05: 10 mg via INTRAVENOUS

## 2022-11-05 MED ORDER — SUGAMMADEX SODIUM 500 MG/5ML IV SOLN
INTRAVENOUS | Status: AC
  Filled 2022-11-05: qty 5

## 2022-11-05 MED ORDER — ONDANSETRON HCL 4 MG/2ML IJ SOLN
INTRAMUSCULAR | Status: DC | PRN
  Administered 2022-11-05: 4 mg via INTRAVENOUS

## 2022-11-05 MED ORDER — LIDOCAINE 2% (20 MG/ML) 5 ML SYRINGE
INTRAMUSCULAR | Status: DC | PRN
  Administered 2022-11-05: 100 mg via INTRAVENOUS

## 2022-11-05 MED ORDER — LIDOCAINE HCL (PF) 2 % IJ SOLN
INTRAMUSCULAR | Status: AC
  Filled 2022-11-05: qty 5

## 2022-11-05 MED ORDER — PROPOFOL 10 MG/ML IV BOLUS
INTRAVENOUS | Status: DC | PRN
  Administered 2022-11-05: 200 mg via INTRAVENOUS

## 2022-11-05 MED ORDER — MEPERIDINE HCL 50 MG/ML IJ SOLN: 6.2500 mg | INTRAMUSCULAR | Status: DC | PRN

## 2022-11-05 MED ORDER — PROPOFOL 10 MG/ML IV BOLUS
INTRAVENOUS | Status: AC
  Filled 2022-11-05: qty 20

## 2022-11-05 MED ORDER — OXYCODONE HCL 5 MG/5ML PO SOLN
5.0000 mg | Freq: Four times a day (QID) | ORAL | Status: DC | PRN
  Administered 2022-11-06 (×2): 5 mg via ORAL
  Filled 2022-11-05 (×3): qty 5

## 2022-11-05 MED ORDER — ONDANSETRON HCL 4 MG/2ML IJ SOLN
INTRAMUSCULAR | Status: AC
  Filled 2022-11-05: qty 2

## 2022-11-05 MED ORDER — DEXAMETHASONE SODIUM PHOSPHATE 10 MG/ML IJ SOLN
INTRAMUSCULAR | Status: DC | PRN
  Administered 2022-11-05: 10 mg via INTRAVENOUS

## 2022-11-05 MED ORDER — ONDANSETRON HCL 4 MG/2ML IJ SOLN: 4.0000 mg | INTRAMUSCULAR | Status: DC | PRN

## 2022-11-05 MED ORDER — MIDAZOLAM HCL 2 MG/2ML IJ SOLN
INTRAMUSCULAR | Status: DC | PRN
  Administered 2022-11-05: 2 mg via INTRAVENOUS

## 2022-11-05 MED ORDER — BUPIVACAINE LIPOSOME 1.3 % IJ SUSP: 20.0000 mL | Freq: Once | INTRAMUSCULAR | Status: DC

## 2022-11-05 MED ORDER — ENOXAPARIN SODIUM 30 MG/0.3ML IJ SOSY: 30.0000 mg | PREFILLED_SYRINGE | Freq: Two times a day (BID) | INTRAMUSCULAR | Status: DC

## 2022-11-05 MED ORDER — MORPHINE SULFATE (PF) 2 MG/ML IV SOLN: 1.0000 mg | INTRAVENOUS | Status: DC | PRN

## 2022-11-05 MED ORDER — KETAMINE HCL 10 MG/ML IJ SOLN
INTRAMUSCULAR | Status: DC | PRN
  Administered 2022-11-05: 50 mg via INTRAVENOUS

## 2022-11-05 MED ORDER — LACTATED RINGERS IR SOLN
Status: DC | PRN
  Administered 2022-11-05: 1000 mL

## 2022-11-05 MED ORDER — ACETAMINOPHEN 500 MG PO TABS
1000.0000 mg | ORAL_TABLET | Freq: Three times a day (TID) | ORAL | Status: DC
  Administered 2022-11-05 – 2022-11-06 (×4): 1000 mg via ORAL
  Filled 2022-11-05 (×6): qty 2

## 2022-11-05 MED ORDER — APREPITANT 40 MG PO CAPS
40.0000 mg | ORAL_CAPSULE | ORAL | Status: AC
  Administered 2022-11-05: 40 mg via ORAL
  Filled 2022-11-05: qty 1

## 2022-11-05 MED ORDER — ROCURONIUM BROMIDE 10 MG/ML (PF) SYRINGE
PREFILLED_SYRINGE | INTRAVENOUS | Status: AC
  Filled 2022-11-05: qty 10

## 2022-11-05 MED ORDER — SODIUM CHLORIDE 0.9 % IV SOLN
2.0000 g | INTRAVENOUS | Status: AC
  Administered 2022-11-05: 2 g via INTRAVENOUS
  Filled 2022-11-05: qty 2

## 2022-11-05 MED ORDER — ACETAMINOPHEN 500 MG PO TABS
1000.0000 mg | ORAL_TABLET | ORAL | Status: AC
  Administered 2022-11-05: 1000 mg via ORAL
  Filled 2022-11-05: qty 2

## 2022-11-05 MED ORDER — BUPIVACAINE HCL (PF) 0.5 % IJ SOLN
INTRAMUSCULAR | Status: DC | PRN
  Administered 2022-11-05: 30 mL

## 2022-11-05 MED ORDER — BUPIVACAINE HCL (PF) 0.25 % IJ SOLN
INTRAMUSCULAR | Status: AC
  Filled 2022-11-05: qty 30

## 2022-11-05 SURGICAL SUPPLY — 61 items
ANTIFOG SOL W/FOAM PAD STRL (MISCELLANEOUS) ×1
APPLIER CLIP ROT 13.4 12 LRG (CLIP)
BAG COUNTER SPONGE SURGICOUNT (BAG) IMPLANT
BAG LAPAROSCOPIC 12 15 PORT 16 (BASKET) ×1 IMPLANT
BAG RETRIEVAL 12/15 (BASKET) ×1
BENZOIN TINCTURE PRP APPL 2/3 (GAUZE/BANDAGES/DRESSINGS) ×1 IMPLANT
BLADE SURG SZ11 CARB STEEL (BLADE) ×1 IMPLANT
BNDG ADH 1X3 SHEER STRL LF (GAUZE/BANDAGES/DRESSINGS) ×6 IMPLANT
CABLE HIGH FREQUENCY MONO STRZ (ELECTRODE) IMPLANT
CHLORAPREP W/TINT 26 (MISCELLANEOUS) ×1 IMPLANT
CLIP APPLIE ROT 13.4 12 LRG (CLIP) IMPLANT
COVER SURGICAL LIGHT HANDLE (MISCELLANEOUS) ×1 IMPLANT
DRAPE UTILITY XL STRL (DRAPES) ×2 IMPLANT
ELECT REM PT RETURN 15FT ADLT (MISCELLANEOUS) ×1 IMPLANT
GAUZE 4X4 16PLY ~~LOC~~+RFID DBL (SPONGE) ×1 IMPLANT
GLOVE BIOGEL PI IND STRL 7.0 (GLOVE) ×1 IMPLANT
GLOVE SURG SS PI 7.0 STRL IVOR (GLOVE) ×1 IMPLANT
GOWN STRL REUS W/ TWL LRG LVL3 (GOWN DISPOSABLE) ×1 IMPLANT
GOWN STRL REUS W/ TWL XL LVL3 (GOWN DISPOSABLE) IMPLANT
GOWN STRL REUS W/TWL LRG LVL3 (GOWN DISPOSABLE) ×1
GOWN STRL REUS W/TWL XL LVL3 (GOWN DISPOSABLE)
GRASPER SUT TROCAR 14GX15 (MISCELLANEOUS) ×1 IMPLANT
IRRIG SUCT STRYKERFLOW 2 WTIP (MISCELLANEOUS) ×1
IRRIGATION SUCT STRKRFLW 2 WTP (MISCELLANEOUS) ×1 IMPLANT
KIT BASIN OR (CUSTOM PROCEDURE TRAY) ×1 IMPLANT
KIT TURNOVER KIT A (KITS) IMPLANT
MARKER SKIN DUAL TIP RULER LAB (MISCELLANEOUS) ×1 IMPLANT
MAT PREVALON FULL STRYKER (MISCELLANEOUS) IMPLANT
NDL SPNL 22GX3.5 QUINCKE BK (NEEDLE) ×1 IMPLANT
NEEDLE SPNL 22GX3.5 QUINCKE BK (NEEDLE) ×1 IMPLANT
PACK BASIC VI WITH GOWN DISP (CUSTOM PROCEDURE TRAY) IMPLANT
RELOAD STAPLE 60 3.6 BLU REG (STAPLE) IMPLANT
RELOAD STAPLE 60 3.8 GOLD REG (STAPLE) IMPLANT
RELOAD STAPLE 60 4.1 GRN THCK (STAPLE) IMPLANT
RELOAD STAPLER BLUE 60MM (STAPLE) ×4 IMPLANT
RELOAD STAPLER GOLD 60MM (STAPLE) ×2 IMPLANT
RELOAD STAPLER GREEN 60MM (STAPLE) IMPLANT
SCISSORS LAP 5X45 EPIX DISP (ENDOMECHANICALS) IMPLANT
SET TUBE SMOKE EVAC HIGH FLOW (TUBING) ×1 IMPLANT
SHEARS HARMONIC ACE PLUS 45CM (MISCELLANEOUS) ×1 IMPLANT
SLEEVE GASTRECTOMY 40FR VISIGI (MISCELLANEOUS) ×1 IMPLANT
SLEEVE Z-THREAD 5X100MM (TROCAR) ×2 IMPLANT
SOLUTION ANTFG W/FOAM PAD STRL (MISCELLANEOUS) ×1 IMPLANT
SPIKE FLUID TRANSFER (MISCELLANEOUS) ×1 IMPLANT
STAPLER ECHELON LONG 3000 60 (ENDOMECHANICALS) ×1 IMPLANT
STAPLER ECHELON LONG 60 440 (INSTRUMENTS) ×1 IMPLANT
STAPLER RELOAD BLUE 60MM (STAPLE) ×4
STAPLER RELOAD GOLD 60MM (STAPLE) ×2
STAPLER RELOAD GREEN 60MM (STAPLE)
STRIP CLOSURE SKIN 1/2X4 (GAUZE/BANDAGES/DRESSINGS) ×1 IMPLANT
SUT ETHIBOND 0 36 GRN (SUTURE) IMPLANT
SUT MNCRL AB 4-0 PS2 18 (SUTURE) ×1 IMPLANT
SUT VICRYL 0 TIES 12 18 (SUTURE) ×1 IMPLANT
SYR 20ML LL LF (SYRINGE) ×1 IMPLANT
SYR 50ML LL SCALE MARK (SYRINGE) ×1 IMPLANT
SYS KII OPTICAL ACCESS 15MM (TROCAR) ×1
SYSTEM KII OPTICAL ACCESS 15MM (TROCAR) ×1 IMPLANT
TOWEL OR 17X26 10 PK STRL BLUE (TOWEL DISPOSABLE) ×1 IMPLANT
TOWEL OR NON WOVEN STRL DISP B (DISPOSABLE) ×1 IMPLANT
TROCAR Z-THREAD OPTICAL 5X100M (TROCAR) ×1 IMPLANT
TUBING CONNECTING 10 (TUBING) ×2 IMPLANT

## 2022-11-05 NOTE — Transfer of Care (Signed)
Immediate Anesthesia Transfer of Care Note  Patient: Kathryn Compton  Procedure(s) Performed: LAPAROSCOPIC SLEEVE GASTRECTOMY UPPER GI ENDOSCOPY  Patient Location: PACU  Anesthesia Type:General  Level of Consciousness: drowsy  Airway & Oxygen Therapy: Patient Spontanous Breathing and Patient connected to face mask oxygen  Post-op Assessment: Report given to RN and Post -op Vital signs reviewed and stable  Post vital signs: Reviewed and stable  Last Vitals:  Vitals Value Taken Time  BP    Temp    Pulse 73 11/05/22 1218  Resp 11 11/05/22 1218  SpO2 98 % 11/05/22 1218  Vitals shown include unvalidated device data.  Last Pain:  Vitals:   11/05/22 1008  TempSrc: Oral         Complications: No notable events documented.

## 2022-11-05 NOTE — Anesthesia Procedure Notes (Signed)
Procedure Name: Intubation Date/Time: 11/05/2022 10:54 AM  Performed by: Sharlette Dense, CRNAPre-anesthesia Checklist: Patient identified, Emergency Drugs available, Suction available and Patient being monitored Patient Re-evaluated:Patient Re-evaluated prior to induction Oxygen Delivery Method: Circle system utilized Preoxygenation: Pre-oxygenation with 100% oxygen Induction Type: IV induction Ventilation: Mask ventilation without difficulty Laryngoscope Size: Miller and 2 Grade View: Grade I Tube type: Oral Tube size: 7.5 mm Number of attempts: 1 Airway Equipment and Method: Stylet Placement Confirmation: ETT inserted through vocal cords under direct vision, positive ETCO2 and breath sounds checked- equal and bilateral Secured at: 22 cm Tube secured with: Tape Dental Injury: Teeth and Oropharynx as per pre-operative assessment

## 2022-11-05 NOTE — Anesthesia Postprocedure Evaluation (Signed)
Anesthesia Post Note  Patient: Kathryn Compton  Procedure(s) Performed: LAPAROSCOPIC SLEEVE GASTRECTOMY UPPER GI ENDOSCOPY     Patient location during evaluation: PACU Anesthesia Type: General Level of consciousness: sedated and patient cooperative Pain management: pain level controlled Vital Signs Assessment: post-procedure vital signs reviewed and stable Respiratory status: spontaneous breathing Cardiovascular status: stable Anesthetic complications: no   No notable events documented.  Last Vitals:  Vitals:   11/05/22 1600 11/05/22 1615  BP: 136/67 135/69  Pulse: 66 65  Resp: 14 12  Temp:  36.7 C  SpO2: 92% 94%    Last Pain:  Vitals:   11/05/22 1556  TempSrc:   PainSc: [place]

## 2022-11-05 NOTE — Op Note (Signed)
Kathryn Compton [id number] 1951-10-10 11/05/2022  Preoperative diagnosis: severe obesity  Postoperative diagnosis: Same   Procedure: upper endoscopy   Surgeon: Leighton Ruff. Ophelia Sipe M.D., FACS   Anesthesia: Gen.   Indications for procedure: 71 y.o. year old female undergoing Laparoscopic Gastric Sleeve Resection and an EGD was requested to evaluate the new gastric sleeve.   Description of procedure: After we have completed the sleeve resection, I scrubbed out and obtained the Olympus endoscope. I gently placed endoscope in the patient's oropharynx and gently glided it down the esophagus without any difficulty under direct visualization. Once I was in the gastric sleeve, I insufflated the stomach with air. I was able to cannulate and advanced the scope through the gastric sleeve. I was able to cannulate the duodenum with ease. Dr. Kieth Brightly had placed saline in the upper abdomen. Upon further insufflation of the gastric sleeve there was no evidence of bubbles. GE junction located at 38 cm.  Upon further inspection of the gastric sleeve, the mucosa appeared normal. There is no evidence of any mucosal abnormality. The sleeve was widely patent at the angularis. There was no evidence of bleeding. The gastric sleeve was decompressed. The scope was withdrawn. The patient tolerated this portion of the procedure well. Please see Dr Amie Portland operative note for details regarding the laparoscopic gastric sleeve resection.   Leighton Ruff. Redmond Pulling, MD, FACS  General, Bariatric, & Minimally Invasive Surgery  [facility], [place]

## 2022-11-05 NOTE — Progress Notes (Signed)
PHARMACY CONSULT FOR:  Risk Assessment for Post-Discharge VTE Following Bariatric Surgery  Post-Discharge VTE Risk Assessment: This patient's probability of 30-day post-discharge VTE is increased due to the factors marked: X Sleeve gastrectomy   Liver disorder (transplant, cirrhosis, or nonalcoholic steatohepatitis)   Hx of VTE   Hemorrhage requiring transfusion   GI perforation, leak, or obstruction   ====================================================    Female  X  Age >/=60 years    BMI >/=50 kg/m2    CHF    Dyspnea at Rest    Paraplegia   X Non-gastric-band surgery    Operation Time >/=3 hr    Return to OR     Length of Stay >/= 3 d   Hypercoagulable condition   Significant venous stasis      Predicted probability of 30-day post-discharge VTE:  Other patient-specific factors to consider: 0.31%  Recommendation for Discharge: No pharmacologic prophylaxis post-discharge Kathryn Compton is a 71 y.o. female who underwent laparoscopic sleeve gastrectomy 11/05/2022   Allergies  Allergen Reactions   Other Itching and Other (See Comments)    Antidepressants-Itching, Gaining Weight Tolerates citalopram     Patient Measurements: Height: '5\' 7"'$  (170.2 cm) Weight: 122.9 kg (271 lb) IBW/kg (Calculated) : 61.6 Body mass index is 42.44 kg/m.  No results for input(s): "WBC", "HGB", "HCT", "PLT", "APTT", "CREATININE", "LABCREA", "CREAT24HRUR", "MG", "PHOS", "ALBUMIN", "PROT", "AST", "ALT", "ALKPHOS", "BILITOT", "BILIDIR", "IBILI" in the last 72 hours. Estimated Creatinine Clearance: 90.2 mL/min (by C-G formula based on SCr of 0.7 mg/dL).    Past Medical History:  Diagnosis Date   Anxiety    Arthritis    Depression    GERD (gastroesophageal reflux disease)    Headache    Hypertension    Motion sickness    Palpitations    Plantar fasciitis    Seizures (HCC)    Vertigo      Medications Prior to Admission  Medication Sig Dispense Refill Last Dose   CALCIUM PO Take  1 tablet by mouth daily.   11/04/2022   citalopram (CELEXA) 20 MG tablet Take 20 mg by mouth daily as needed (anxiety).   11/05/2022 at 0800   citalopram (CELEXA) 40 MG tablet Take 40 mg by mouth daily.   11/05/2022 at 0800   COLLAGEN PO Take 1 Scoop by mouth daily.   11/04/2022   Erenumab-aooe (AIMOVIG) 70 MG/ML SOAJ Inject 70 mg into the skin every 30 (thirty) days. 1.12 mL 11 Past Month   fluticasone (CUTIVATE) 0.05 % cream Apply 1 application  topically daily as needed (rash).   Past Week   hydrOXYzine (ATARAX) 25 MG tablet Take 6.25 mg by mouth at bedtime as needed (sleep).   Past Week   ketotifen (ZADITOR) 0.035 % ophthalmic solution Place 1 drop into both eyes 2 (two) times daily as needed (itching).   Past Week   Multiple Vitamin (MULTIVITAMIN WITH MINERALS) TABS tablet Take 1 tablet by mouth daily.   Past Week   naproxen sodium (ALEVE) 220 MG tablet Take 220 mg by mouth 2 (two) times daily as needed (pain).   Past Week   propranolol (INDERAL) 40 MG tablet Take 1 tablet (40 mg total) by mouth 2 (two) times daily. 60 tablet 6 11/05/2022 at 0800   VITAMIN D PO Take 1 capsule by mouth daily.   Past Week    Eudelia Bunch, Pharm.D Use secure chat for questions 11/05/2022 4:49 PM

## 2022-11-05 NOTE — Op Note (Signed)
Preop Diagnosis: Obesity Class III  Postop Diagnosis: same  Procedure performed: laparoscopic Sleeve Gastrectomy  Assitant: Greer Pickerel  Indications:  The patient is a 71 y.o. year-old morbidly obese female who has been followed in the Bariatric Clinic as an outpatient. This patient was diagnosed with morbid obesity with a BMI of Body mass index is 42.44 kg/m. and significant co-morbidities including hypertension.  The patient was counseled extensively in the Bariatric Outpatient Clinic and after a thorough explanation of the risks and benefits of surgery (including death from complications, bowel leak, infection such as peritonitis and/or sepsis, internal hernia, bleeding, need for blood transfusion, bowel obstruction, organ failure, pulmonary embolus, deep venous thrombosis, wound infection, incisional hernia, skin breakdown, and others entailed on the consent form) and after a compliant diet and exercise program, the patient was scheduled for an elective laparoscopic sleeve gastrectomy.  Description of Operation:  Following informed consent, the patient was taken to the operating room and placed on the operating table in the supine position.  She had previously received prophylactic antibiotics and subcutaneous heparin for DVT prophylaxis in the pre-op holding area.  After induction of general endotracheal anesthesia by the anesthesiologist, the patient underwent placement of sequential compression devices and an oro-gastric tube.  A timeout was confirmed by the surgery and anesthesia teams.  The patient was adequately padded at all pressure points and placed on a footboard to prevent slippage from the OR table during extremes of position during surgery.  She underwent a routine sterile prep and drape of her entire abdomen.    Next, A transverse incision was made under the left subcostal area and a 3m optical viewing trocar was introduced into the peritoneal cavity. Pneumoperitoneum was applied  with a high flow and low pressure. A laparoscope was inserted to confirm placement. A extraperitoneal block was then placed at the lateral abdominal wall using exparel diluted with marcaine. 5 additional incisions were placed: 1 537mtrocar to the left of the midline. 1 additional 23m68mrocar in the left lateral area, 1 62m26mocar in the right mid abdomen, 1 23mm 37mcar in the right subcostal area, and a Nathanson retractor was placed through a subxiphoid incision.  Next, a hole was created through the lesser omentum along the greater curve of the stomach to enter the lesser sac. The vessels along the greater omentum were  Then ligated and divided using the Harmonic scalpel moving towards the spleen and then short gastric vessels were ligated and divided in the same fashion to fully mobilize the fundus. The left crus was identified to ensure completion of the dissection. Next the antrum was measured and dissection continued inferiorly along the greater curve towards the pylorus and stopped 6cm from the pylorus.  On dissecting the hiatus there was a lipoma along the esophagus concerning for hiatal hernia. THe UGI was negative. The posterior area was dissected out showing a small hiatal hernia. This was repaired with 2-0 Ethibond.   A 40Fr ViSiGi dilator was placed into the esophgaus and along the lesser curve of the stomach and placed on suction. 2 60mm 7m load echelon stapler(s) followed by 4 60mm b80mload echelon stapler(s) were used to make the resection along the antrum being sure to stay well away from the angularis by angling the jaws of the stapler towards the greater curve and later completing the resection staying along the ViSiGi [place] the fundus was not retained by appropriately retracting it lateral. Air was inserted through the ViSiGi [place] a  leak test showing no bubbles and a neutral lie of the stomach.  The assistant then went and performed an upper endoscopy and leak test. No  bubbles were seen and the sleeve and antrum distended appropriately. The specimen was then placed in an endocatch bag and removed by the 19m port. The pressure was changed to 8 mm Hg. Hemostasis was ensured. The fascia of the 146mport was closed with a 0 vicryl by suture passer. Pneumoperitoneum was evacuated, all ports were removed and all incisions closed with 4-0 monocryl suture in subcuticular fashion. Steristrips and bandaids were put in place for dressing. The patient awoke from anesthesia and was brought to pacu in stable condition. All counts were correct.  Estimated blood loss: <306mSpecimens:  Sleeve gastrectomy  Local Anesthesia: 50 ml Exparel:0.5% Marcaine mix  Post-Op Plan:       Pain Management: PO, prn      Antibiotics: Prophylactic      Anticoagulation: Prophylactic, Starting now      Post Op Studies/Consults: Not applicable      Intended Discharge: within 48h      Intended Outpatient Follow-Up: Two Week      Intended Outpatient Studies: Not Applicable      Other: Not Applicable  LukArta Brucensinger

## 2022-11-05 NOTE — H&P (Signed)
Chief Complaint: Post Operative Visit (weightloss)   History of Present Illness: Kathryn Compton is a 71 y.o. female who is seen today for bariatric preop.  She is doing well. She has no new medications. She is excited and ready for the surgery  Review of Systems: A complete review of systems was obtained from the patient. I have reviewed this information and discussed as appropriate with the patient. See HPI as well for other ROS.  Review of Systems  Constitutional: Negative.  HENT: Negative.  Eyes: Negative.  Respiratory: Negative.  Cardiovascular: Negative.  Gastrointestinal: Negative.  Genitourinary: Negative.  Musculoskeletal: Negative.  Skin: Negative.  Neurological: Negative.  Endo/Heme/Allergies: Negative.  Psychiatric/Behavioral: Negative.    Medical History: Past Medical History:  Diagnosis Date  Anxiety  Hypertension  Sleep apnea   There is no problem list on file for this patient.  Past Surgical History:  Procedure Laterality Date  REDUCTION MAMMAPLASTY  2000    No Known Allergies  Current Outpatient Medications on File Prior to Visit  Medication Sig Dispense Refill  citalopram (CELEXA) 40 MG tablet Take 40 mg by mouth once daily  propranoloL (INDERAL) 40 MG tablet   No current facility-administered medications on file prior to visit.   Family History  Problem Relation Age of Onset  Diabetes Maternal Grandmother    Social History   Tobacco Use  Smoking Status Never  Smokeless Tobacco Never    Social History   Socioeconomic History  Marital status: Single  Tobacco Use  Smoking status: Never  Smokeless tobacco: Never  Vaping Use  Vaping Use: Never used  Substance and Sexual Activity  Alcohol use: Never  Drug use: Never   Objective:   Vitals:  10/17/22 1433  BP: 130/80  Pulse: 89  Temp: 36.7 C (98 F)  SpO2: 98%  Weight: (!) 121.1 kg (267 lb)  Height: 170.2 cm ('5\' 7"'$ )   Body mass index is 41.82 kg/m.  Physical  Exam Constitutional:  Appearance: Normal appearance.  HENT:  Head: Normocephalic and atraumatic.  Pulmonary:  Effort: Pulmonary effort is normal.  Musculoskeletal:  General: Normal range of motion.  Cervical back: Normal range of motion.  Neurological:  General: No focal deficit present.  Mental Status: She is alert and oriented to person, place, and time. Mental status is at baseline.  Psychiatric:  Mood and Affect: Mood normal.  Behavior: Behavior normal.  Thought Content: Thought content normal.   Labs, Imaging and Diagnostic Testing:  I reviewed dietitian notes, recent labs, and upper GI x-ray images  Assessment and Plan:   Diagnoses and all orders for this visit:  Morbid (severe) obesity due to excess calories (CMS-HCC)  Obstructive sleep apnea syndrome  Vitamin deficiency  The patient meets weight loss surgery criteria. Due to the above reasons, I think minimally invasive vertical sleeve gastrectomy is the best option for the patient.   We discussed sleeve gastrectomy. We discussed the preoperative, operative and postoperative process. I explained the surgery in detail including the performance of an EGD near the end of the surgery to test for leak. We discussed the typical hospital course including a 1-2 day stay baring any complications. The patient was given educational material. I quoted the patient that most patients can lose up to 50-70% of their excess weight. We did discuss the possibility of weight regain several years after the procedure.   The risks of infection, bleeding, pain, scarring, weight regain, too little or too much weight loss, vitamin deficiencies and need for lifelong vitamin  supplementation, hair loss, need for protein supplementation, leaks, stricture, reflux, food intolerance, gallstone formation, hernia, need for reoperation, need for open surgery, injury to spleen or surrounding structures, DVT's, PE, and death again discussed with the patient  and the patient expressed understanding and desires to proceed with minimally invasive sleeve gastrectomy, possible open, intraoperative endoscopy.  We discussed that before and after surgery that there would be an alteration in their diet. I explained that we may put them on a diet 2 weeks before surgery. I also explained that they would be on a liquid diet for 2 weeks after surgery. We discussed that they would have to avoid certain foods after surgery. We discussed the importance of physical activity as well as compliance with our dietary and supplement recommendations and routine follow-up.

## 2022-11-05 NOTE — Discharge Instructions (Signed)

## 2022-11-06 ENCOUNTER — Telehealth: Payer: Self-pay | Admitting: Orthopaedic Surgery

## 2022-11-06 ENCOUNTER — Encounter (HOSPITAL_COMMUNITY): Payer: Self-pay | Admitting: General Surgery

## 2022-11-06 LAB — CBC WITH DIFFERENTIAL/PLATELET
Abs Immature Granulocytes: 0.03 10*3/uL (ref 0.00–0.07)
Basophils Absolute: 0 10*3/uL (ref 0.0–0.1)
Basophils Relative: 0 %
Eosinophils Absolute: 0 10*3/uL (ref 0.0–0.5)
Eosinophils Relative: 0 %
HCT: 41 % (ref 36.0–46.0)
Hemoglobin: 12.8 g/dL (ref 12.0–15.0)
Immature Granulocytes: 0 %
Lymphocytes Relative: 11 %
Lymphs Abs: 1.1 10*3/uL (ref 0.7–4.0)
MCH: 27.5 pg (ref 26.0–34.0)
MCHC: 31.2 g/dL (ref 30.0–36.0)
MCV: 88.2 fL (ref 80.0–100.0)
Monocytes Absolute: 0.5 10*3/uL (ref 0.1–1.0)
Monocytes Relative: 6 %
Neutro Abs: 7.9 10*3/uL — ABNORMAL HIGH (ref 1.7–7.7)
Neutrophils Relative %: 83 %
Platelets: 262 10*3/uL (ref 150–400)
RBC: 4.65 MIL/uL (ref 3.87–5.11)
RDW: 15.8 % — ABNORMAL HIGH (ref 11.5–15.5)
WBC: 9.6 10*3/uL (ref 4.0–10.5)
nRBC: 0 % (ref 0.0–0.2)

## 2022-11-06 MED ORDER — CITALOPRAM HYDROBROMIDE 20 MG PO TABS
40.0000 mg | ORAL_TABLET | Freq: Every day | ORAL | Status: DC
  Administered 2022-11-06: 40 mg via ORAL
  Filled 2022-11-06: qty 2

## 2022-11-06 NOTE — Progress Notes (Signed)
   Progress Note: Metabolic and Bariatric Surgery Service   Chief Complaint/Subjective: Gas pain issues, tolerating water well  Objective: Vital signs in last 24 hours: Temp:  [97.7 F (36.5 C)-98.5 F (36.9 C)] 97.7 F (36.5 C) (12/06 0901) Pulse Rate:  [60-73] 60 (12/06 0901) Resp:  [12-20] 17 (12/06 0901) BP: (134-160)/(63-78) 147/63 (12/06 0901) SpO2:  [91 %-98 %] 96 % (12/06 0901) Weight:  [121.6 kg-122.9 kg] 122.9 kg (12/05 0954) Last BM Date : 11/05/22  Intake/Output from previous day: 12/05 0701 - 12/06 0700 In: 2305.5 [P.O.:300; I.V.:1855.5; IV Piggyback:150] Out: 825 [Urine:800; Blood:25] Intake/Output this shift: Total I/O In: 73 [P.O.:60] Out: 300 [Urine:300]  Lungs: nonlabored  Cardiovascular: RRR  Abd: soft, incisions c/d/I, nontender  Extremities: no edema  Neuro: AOx4  Lab Results: CBC  Recent Labs    11/05/22 1659 11/06/22 0443  WBC 13.1* 9.6  HGB 13.1 12.8  HCT 41.3 41.0  PLT 274 262   BMET Recent Labs    11/05/22 1659  CREATININE 0.77   PT/INR No results for input(s): "LABPROT", "INR" in the last 72 hours. ABG No results for input(s): "PHART", "HCO3" in the last 72 hours.  Invalid input(s): "PCO2", "PO2"  Studies/Results:  Anti-infectives: Anti-infectives (From admission, onward)    Start     Dose/Rate Route Frequency Ordered Stop   11/05/22 1000  cefoTEtan (CEFOTAN) 2 g in sodium chloride 0.9 % 100 mL IVPB        2 g 200 mL/hr over 30 Minutes Intravenous On call to O.R. 11/05/22 0948 11/06/22 0620       Medications: Scheduled Meds:  acetaminophen  1,000 mg Oral Q8H   Or   acetaminophen (TYLENOL) oral liquid 160 mg/5 mL  1,000 mg Oral Q8H   heparin injection (subcutaneous)  5,000 Units Subcutaneous Q8H   propranolol  40 mg Oral BID   Ensure Max Protein  2 oz Oral Q2H   Continuous Infusions:  dextrose 5 % and 0.45% NaCl 75 mL/hr at 11/06/22 0621   famotidine (PEPCID) IV 20 mg (11/05/22 2210)   lactated ringers 50  mL/hr at 11/06/22 0620   PRN Meds:.hydrALAZINE, morphine injection, ondansetron (ZOFRAN) IV, oxyCODONE, simethicone  Assessment/Plan: Patient Active Problem List   Diagnosis Date Noted   Obesity 11/05/2022   Chronic migraine w/o aura w/o status migrainosus, not intractable 08/01/2022   Morbid obesity ([place]) 07/31/2021   Pressure in head 06/19/2021   Essential hypertension 11/09/2020   Hip pain 11/09/2020   Papanicolaou smear of cervix with positive high risk human papilloma virus (HPV) test 08/01/2020   Encounter for screening fecal occult blood testing 08/01/2020   Routine medical exam 08/01/2020   Encounter for gynecological examination with Papanicolaou smear of cervix 08/01/2020   Abnormal gait 07/27/2020   Difficulty balancing 07/27/2020   Dizziness 07/27/2020   Sleep apnea 07/27/2020   Insomnia 06/01/2020   Obstructive sleep apnea of adult 06/01/2020   Vitamin D deficiency 05/31/2020   Anxiety disorder 04/20/2020   Palpitations 04/20/2020   Sacroiliac joint pain 04/20/2020   Seizure ([place]) 04/20/2020   Syncope and collapse 04/20/2020   Special screening for malignant neoplasms, colon    s/p Procedure(s): LAPAROSCOPIC SLEEVE GASTRECTOMY UPPER GI ENDOSCOPY 11/05/2022 -ambulate -continue liquid challenge -possible discharge this afternoon if gas pain lets up  Disposition:  LOS: 1 day  The patient will be in the hospital for normal postop protocol  Mickeal Skinner, MD [phone] [facility]

## 2022-11-06 NOTE — Telephone Encounter (Signed)
Patient lvm checking to see if her injections has been approved yet.  Please call the patient and advise.  Pt's # [phone]

## 2022-11-06 NOTE — Progress Notes (Signed)
Patient alert and oriented, pain is controlled. Patient is tolerating fluids, advanced to protein shake today, patient is tolerating well.  Reviewed Gastric sleeve discharge instructions with patient and patient is able to articulate understanding.  Provided information on BELT program, Support Group and [facility] outpatient pharmacy. All questions answered, will continue to monitor.  

## 2022-11-06 NOTE — Progress Notes (Signed)
Mobility Specialist - Progress Note   11/06/22 1242  Mobility  Activity Ambulated with assistance in hallway  Level of Assistance Modified independent, requires aide device or extra time  Distance Ambulated (ft) 400 ft  Activity Response Tolerated well  Mobility Referral Yes  $Mobility charge 1 Mobility   Pt received in recliner and agreeable to mobility. No complaints during mobility. Pt to recliner after session with all needs met.     [facility]

## 2022-11-06 NOTE — TOC CM/SW Note (Signed)
Transition of Care [facility]) Screening Note  Patient Details  Name: Kathryn Compton Date of Birth: 07-08-1951  Transition of Care [facility]) CM/SW Contact:    Sherie Don, LCSW Phone Number: 11/06/2022, 9:42 AM  Transition of Care Department [facility]) has reviewed patient and no TOC needs have been identified at this time. We will continue to monitor patient advancement through interdisciplinary progression rounds. If new patient transition needs arise, please place a TOC consult.

## 2022-11-07 LAB — CBC WITH DIFFERENTIAL/PLATELET
Abs Immature Granulocytes: 0.03 10*3/uL (ref 0.00–0.07)
Basophils Absolute: 0.1 10*3/uL (ref 0.0–0.1)
Basophils Relative: 1 %
Eosinophils Absolute: 0 10*3/uL (ref 0.0–0.5)
Eosinophils Relative: 0 %
HCT: 39.4 % (ref 36.0–46.0)
Hemoglobin: 11.9 g/dL — ABNORMAL LOW (ref 12.0–15.0)
Immature Granulocytes: 0 %
Lymphocytes Relative: 31 %
Lymphs Abs: 2.7 10*3/uL (ref 0.7–4.0)
MCH: 27.2 pg (ref 26.0–34.0)
MCHC: 30.2 g/dL (ref 30.0–36.0)
MCV: 90.2 fL (ref 80.0–100.0)
Monocytes Absolute: 0.6 10*3/uL (ref 0.1–1.0)
Monocytes Relative: 7 %
Neutro Abs: 5.4 10*3/uL (ref 1.7–7.7)
Neutrophils Relative %: 61 %
Platelets: 136 10*3/uL — ABNORMAL LOW (ref 150–400)
RBC: 4.37 MIL/uL (ref 3.87–5.11)
RDW: 15.8 % — ABNORMAL HIGH (ref 11.5–15.5)
WBC: 8.8 10*3/uL (ref 4.0–10.5)
nRBC: 0 % (ref 0.0–0.2)

## 2022-11-07 MED ORDER — OXYCODONE HCL 5 MG PO TABS: 5.0000 mg | ORAL_TABLET | Freq: Four times a day (QID) | ORAL | 0 refills | Status: DC | PRN

## 2022-11-07 MED ORDER — PANTOPRAZOLE SODIUM 40 MG PO TBEC: 40.0000 mg | DELAYED_RELEASE_TABLET | Freq: Every day | ORAL | 0 refills | Status: DC

## 2022-11-07 MED ORDER — ONDANSETRON 4 MG PO TBDP: 4.0000 mg | ORAL_TABLET | Freq: Four times a day (QID) | ORAL | 0 refills | Status: DC | PRN

## 2022-11-07 NOTE — Telephone Encounter (Signed)
We submitted for Euflexxa, that is not a preferred drug.  I have now submitted with Synvisc.  Will watch this today and call patient once BV available.

## 2022-11-07 NOTE — Progress Notes (Signed)
Pt given d/c instructions and all questions answered. Pt taken via wheelchair by NT to front entrance.

## 2022-11-07 NOTE — Discharge Summary (Signed)
Physician Discharge Summary  Kathryn Compton BHA:[id number] DOB: [birth year] DOA: 11/05/2022  PCP: Carrolyn Meiers, MD  Admit date: 11/05/2022 Discharge date: 11/07/2022  Recommendations for Outpatient Follow-up:   (include homehealth, outpatient follow-up instructions, specific recommendations for PCP to follow-up on, etc.)   Follow-up Information     Juanmiguel Defelice, Arta Bruce, MD. Go on 12/05/2022.   Specialty: General Surgery Why: at 2:30pm. Please arrive 15 minutes prior to your appointment time. Thank you. Contact information: [address] [address] [phone]         Kess Mcilwain, Arta Bruce, MD. Go on 01/08/2023.   Specialty: General Surgery Why: at 9:10am. Please arrive 15 minutes prior to your appointment time. Thank you. Contact information: [address] [place] [address] [phone]                Discharge Diagnoses:  Principal Problem:   Obesity   Surgical Procedure: laparoscopic sleeve gastrectomy, upper endoscopy  Discharge Condition: Good Disposition: Home  Diet recommendation: Postoperative sleeve gastrectomy diet (liquids only)  Filed Weights   11/05/22 0953 11/05/22 0954  Weight: 121.6 kg 122.9 kg     Hospital Course:  The patient was admitted after undergoing laparoscopic sleeve gastrectomy. POD 0 she ambulated well. POD 1 she was started on the water diet protocol and tolerated 360 ml in the first shift. Once meeting the water amount she was advanced to bariatric protein shakes which they tolerated and were discharged home POD 2.  Treatments: surgery: laparoscopic sleeve gastrectomy  Discharge Instructions  Discharge Instructions     Ambulate hourly while awake   Complete by: As directed    Call MD for:  difficulty breathing, headache or visual disturbances   Complete by: As directed    Call MD for:  persistant dizziness or light-headedness   Complete by: As directed    Call  MD for:  persistant nausea and vomiting   Complete by: As directed    Call MD for:  redness, tenderness, or signs of infection (pain, swelling, redness, odor or green/yellow discharge around incision site)   Complete by: As directed    Call MD for:  severe uncontrolled pain   Complete by: As directed    Call MD for:  temperature >101 F   Complete by: As directed    Diet bariatric full liquid   Complete by: As directed    Discharge wound care:   Complete by: As directed    Remove Bandaids tomorrow, ok to shower tomorrow. Steristrips may fall off in 1-3 weeks.   Incentive spirometry   Complete by: As directed    Perform hourly while awake      Allergies as of 11/07/2022       Reactions   Other Itching, Other (See Comments)   Antidepressants-Itching, Gaining Weight Tolerates citalopram         Medication List     STOP taking these medications    naproxen sodium 220 MG tablet Commonly known as: ALEVE       TAKE these medications    Aimovig 70 MG/ML Soaj Generic drug: Erenumab-aooe Inject 70 mg into the skin every 30 (thirty) days.   CALCIUM PO Take 1 tablet by mouth daily.   citalopram 20 MG tablet Commonly known as: CELEXA Take 20 mg by mouth daily as needed (anxiety).   citalopram 40 MG tablet Commonly known as: CELEXA Take 40 mg by mouth daily.   COLLAGEN PO Take 1 Scoop by  mouth daily.   fluticasone 0.05 % cream Commonly known as: CUTIVATE Apply 1 application  topically daily as needed (rash).   hydrOXYzine 25 MG tablet Commonly known as: ATARAX Take 6.25 mg by mouth at bedtime as needed (sleep).   ketotifen 0.035 % ophthalmic solution Commonly known as: ZADITOR Place 1 drop into both eyes 2 (two) times daily as needed (itching).   multivitamin with minerals Tabs tablet Take 1 tablet by mouth daily.   ondansetron 4 MG disintegrating tablet Commonly known as: ZOFRAN-ODT Take 1 tablet (4 mg total) by mouth every 6 (six) hours as needed for  nausea or vomiting.   oxyCODONE 5 MG immediate release tablet Commonly known as: Oxy IR/ROXICODONE Take 1 tablet (5 mg total) by mouth every 6 (six) hours as needed for severe pain.   pantoprazole 40 MG tablet Commonly known as: PROTONIX Take 1 tablet (40 mg total) by mouth daily.   propranolol 40 MG tablet Commonly known as: INDERAL Take 1 tablet (40 mg total) by mouth 2 (two) times daily. Notes to patient: Monitor Blood Pressure Daily and keep a log for primary care physician.  Monitor for symptoms of dehydration.  You may need to make changes to your medications with rapid weight loss.      VITAMIN D PO Take 1 capsule by mouth daily.               Discharge Care Instructions  (From admission, onward)           Start     Ordered   11/07/22 0000  Discharge wound care:       Comments: Remove Bandaids tomorrow, ok to shower tomorrow. Steristrips may fall off in 1-3 weeks.   11/07/22 0913            Follow-up Information     Madalen Gavin, Arta Bruce, MD. Go on 12/05/2022.   Specialty: General Surgery Why: at 2:30pm. Please arrive 15 minutes prior to your appointment time. Thank you. Contact information: [address] [address] [phone]         Zaryia Markel, Arta Bruce, MD. Go on 01/08/2023.   Specialty: General Surgery Why: at 9:10am. Please arrive 15 minutes prior to your appointment time. Thank you. Contact information: [address] [place] [address] [phone]                  The results of significant diagnostics from this hospitalization (including imaging, microbiology, ancillary and laboratory) are listed below for reference.    Significant Diagnostic Studies: DG Foot Complete Left  Result Date: 10/10/2022 Clinical:  fracture second toe left foot X-rays were done of the left foot, three views. There is a healed nondisplaced fracture of the middle phalanx of the left second toe.  Bone  quality is good. Impression:  healed nondisplaced fracture of middle phalanx left second toe. Electronically Signed Sanjuana Kava, MD 11/9/202310:15 AM   Labs: Basic Metabolic Panel: Recent Labs  Lab 11/05/22 1659  CREATININE 0.77   Liver Function Tests: No results for input(s): "AST", "ALT", "ALKPHOS", "BILITOT", "PROT", "ALBUMIN" in the last 168 hours.  CBC: Recent Labs  Lab 11/05/22 1659 11/06/22 0443 11/07/22 0503  WBC 13.1* 9.6 8.8  NEUTROABS  --  7.9* 5.4  HGB 13.1 12.8 11.9*  HCT 41.3 41.0 39.4  MCV 87.7 88.2 90.2  PLT 274 262 136*    CBG: No results for input(s): "GLUCAP" in the last 168 hours.  Principal Problem:  Obesity   VTE plan: no chemical prophylaxis recommended ([email or web address])  Time coordinating discharge: 15 min

## 2022-11-08 ENCOUNTER — Telehealth (HOSPITAL_COMMUNITY): Payer: Self-pay | Admitting: *Deleted

## 2022-11-08 NOTE — Telephone Encounter (Signed)
1.  Tell me about your pain and pain management? Pt denies any current pain, just states that she is sore. Pt c/o right lower abdominal incisional pain with movement and exertion. Discussed with patient to try and splint her abdomen with changing positions to assist with the discomfort.  Encouraged pt to try options and/or contact [facility] if still concerned.   2.  Let's talk about fluid intake.  How much total fluid are you taking in? Pt states that she is getting in at least 40oz of fluid including protein shakes and bottled water. We discussed the goal of 64oz and to continue to liquids to meet fluid goals.  Pt instructed to assess status and suggestions daily utilizing Hydration Action Plan on discharge folder and to call [facility] if in the "red zone".   3.  How much protein have you taken in the last 2 days? Pt states that she is working to meet goal of goal of 60g of protein today.  Pt has already consumed one protein shake.  Pt plans to drink remainder of protein throughout the rest of the day to meet goal.  4.  Have you had nausea?  Tell me about when have experienced nausea and what you did to help? Pt denies nausea.   5.  Has the frequency or color changed with your urine? Pt states that she is urinating "fine" with no changes in frequency or urgency.     6.  Tell me what your incisions look like? Pt has not showered. Instructed pt to remove outer bandages prior to shower and leave incisions open to air and monitor for any abnormal symptoms.   7.  Have you been passing gas? BM? Pt states that she has not had a BM.  Pt instructed to take either Miralax or MoM as instructed per "Gastric Bypass/Sleeve Discharge Home Care Instructions".  Pt to call surgeon's office if not able to have BM with medication.   8.  If a problem or question were to arise who would you call?  Do you know contact numbers for [facility], [facility], and [facility]? Pt denies dehydration symptoms.  Pt can describe s/sx of dehydration.  Pt  knows to call [facility] for surgical, [facility] for nutrition, and [facility] for non-urgent questions or concerns.   9.  How has the walking going? Pt states she is walking around and able to be active without difficulty.   10. Are you still using your incentive spirometer?  If so, how often? Pt states that she forgot the I.S. at discharge.  Discussed with the pt about TCDB and being intentional about performing the lung exercise at least 10x every hour while awake until she sees the surgeon.  11.  How are your vitamins and calcium going?  How are you taking them? Pt states that she will begin the supplements tomorrow.  Reinforced education about taking supplements at least two hours apart.  Reminded patient that the first 30 days post-operatively are important for successful recovery.  Practice good hand hygiene, wearing a mask when appropriate (since optional in most places), and minimizing exposure to people who live outside of the home, especially if they are exhibiting any respiratory, GI, or illness-like symptoms.

## 2022-11-11 LAB — SURGICAL PATHOLOGY

## 2022-11-11 NOTE — Telephone Encounter (Signed)
Checked online, still pending.

## 2022-11-14 NOTE — BH Specialist Note (Signed)
Integrated Behavioral Health via Telemedicine Visit  11/14/2022 Kathryn Compton [id number]  Number of Integrated Behavioral Health Clinician visits: Additional Visit  Session Start time: 1050   Session End time: 1146  Total time in minutes: 56   Referring Provider: *** Patient/Family location: Home*** [facility] Provider location: [facility]  All persons participating in visit: [name] and [facility] Kathryn Compton ***  Types of Service: {CHL AMB TYPE OF SERVICE:[phone]}  I connected with Kathryn Compton and/or Kathryn Compton {family members:[id number]} via  Telephone or Video Enabled Telemedicine Application  (Video is Caregility application) and verified that I am speaking with the correct person using two identifiers. Discussed confidentiality: Yes   I discussed the limitations of telemedicine and the availability of in person appointments.  Discussed there is a possibility of technology failure and discussed alternative modes of communication if that failure occurs.  I discussed that engaging in this telemedicine visit, they consent to the provision of behavioral healthcare and the services will be billed under their insurance.  Patient and/or legal guardian expressed understanding and consented to Telemedicine visit: Yes   Presenting Concerns: Patient and/or family reports the following symptoms/concerns: *** Duration of problem: ***; Severity of problem: {Mild/Moderate/Severe:20260}  Patient and/or Family's Strengths/Protective Factors: {CHL AMB BH PROTECTIVE FACTORS:[id number]}  Goals Addressed: Patient will:  Reduce symptoms of: {IBH Symptoms:21014056}   Increase knowledge and/or ability of: {IBH Patient Tools:21014057}   Demonstrate ability to: {IBH Goals:21014053}  Progress towards Goals: {CHL AMB BH PROGRESS TOWARDS GOALS:[phone]}  Interventions: Interventions utilized:  {IBH  Interventions:21014054} Standardized Assessments completed: {IBH Screening Tools:21014051}  Patient and/or Family Response: Patient agrees with treatment plan. ***  Assessment: Patient currently experiencing ***.   Patient may benefit from continued therapeutic interventions***.  Plan: Follow up with behavioral health clinician on : *** Behavioral recommendations:  -*** -*** Referral(s): {IBH Referrals:21014055}  I discussed the assessment and treatment plan with the patient and/or parent/guardian. They were provided an opportunity to ask questions and all were answered. They agreed with the plan and demonstrated an understanding of the instructions.   They were advised to call back or seek an in-person evaluation if the symptoms worsen or if the condition fails to improve as anticipated.  [name] Maylani Embree, LCSW     04/19/2022    1:47 PM 02/12/2022   11:22 AM 01/15/2022   10:55 AM 09/28/2021   10:25 AM 09/11/2021    1:17 PM  Depression screen PHQ 2/9  Decreased Interest 0 1 0 1 2  Down, Depressed, Hopeless 0 1 0 3 2  PHQ - 2 Score 0 2 0 4 4  Altered sleeping  0 0 3 3  Tired, decreased energy  1 1 3 3   Change in appetite  0 0 1 2  Feeling bad or failure about yourself   3 3 3 3   Trouble concentrating  3 3 0 0  Moving slowly or fidgety/restless  0 0 0 0  Suicidal thoughts  0 0 1   PHQ-9 Score  9 7 15 15       02/12/2022   11:24 AM 01/15/2022   10:58 AM 09/28/2021   10:23 AM 05/09/2021    3:28 PM  GAD 7 : Generalized Anxiety Score  Nervous, Anxious, on Edge 1 1 3 2   Control/stop worrying 0 0 0 2  Worry too much - different things 2 0 2 3  Trouble relaxing 0 0 3 2  Restless 3 3 3  2  Easily annoyed or irritable 0 1 1 3   Afraid - awful might happen 0 0 0 2  Total GAD 7 Score 6 5 12  16

## 2022-11-19 ENCOUNTER — Encounter: Payer: Self-pay | Admitting: Dietician

## 2022-11-19 ENCOUNTER — Encounter: Payer: Medicare Other | Attending: General Surgery | Admitting: Dietician

## 2022-11-19 VITALS — Ht 67.0 in | Wt 256.4 lb

## 2022-11-19 DIAGNOSIS — Z9109 Other allergy status, other than to drugs and biological substances: Secondary | ICD-10-CM | POA: Diagnosis not present

## 2022-11-19 DIAGNOSIS — K55069 Acute infarction of intestine, part and extent unspecified: Secondary | ICD-10-CM | POA: Diagnosis not present

## 2022-11-19 DIAGNOSIS — I10 Essential (primary) hypertension: Secondary | ICD-10-CM | POA: Diagnosis not present

## 2022-11-19 DIAGNOSIS — M1990 Unspecified osteoarthritis, unspecified site: Secondary | ICD-10-CM | POA: Diagnosis not present

## 2022-11-19 DIAGNOSIS — K5900 Constipation, unspecified: Secondary | ICD-10-CM | POA: Diagnosis not present

## 2022-11-19 DIAGNOSIS — R1310 Dysphagia, unspecified: Secondary | ICD-10-CM | POA: Diagnosis not present

## 2022-11-19 DIAGNOSIS — Z888 Allergy status to other drugs, medicaments and biological substances status: Secondary | ICD-10-CM | POA: Diagnosis not present

## 2022-11-19 DIAGNOSIS — R1013 Epigastric pain: Secondary | ICD-10-CM | POA: Diagnosis not present

## 2022-11-19 DIAGNOSIS — I81 Portal vein thrombosis: Secondary | ICD-10-CM | POA: Diagnosis not present

## 2022-11-19 DIAGNOSIS — E669 Obesity, unspecified: Secondary | ICD-10-CM | POA: Insufficient documentation

## 2022-11-19 DIAGNOSIS — R109 Unspecified abdominal pain: Secondary | ICD-10-CM | POA: Diagnosis not present

## 2022-11-19 DIAGNOSIS — Z79899 Other long term (current) drug therapy: Secondary | ICD-10-CM | POA: Diagnosis not present

## 2022-11-19 DIAGNOSIS — F32A Depression, unspecified: Secondary | ICD-10-CM | POA: Diagnosis not present

## 2022-11-19 DIAGNOSIS — Z9884 Bariatric surgery status: Secondary | ICD-10-CM | POA: Diagnosis not present

## 2022-11-19 DIAGNOSIS — K219 Gastro-esophageal reflux disease without esophagitis: Secondary | ICD-10-CM | POA: Diagnosis not present

## 2022-11-19 NOTE — Progress Notes (Signed)
2 Week Post-Operative Nutrition Class   Patient was seen on 11/19/2022 for Post-Operative Nutrition education at the [facility]    Surgery date: 11/05/2022 Surgery type: Sleeve Gastrectomy  Anthropometrics: Weight: 267.9 lbs Height: 5'7" Weight today: 256.4 lbs. BMI: 40.16   Patient's Goal Weight: 150lbs (BMI would be 23.5)  Clinical: Medical History: hypertension, depression, back and knee pain, sleep apnea, chronic fatigue, vitamin deficiency Medications and Supplements: citalopram, hydrOXYzine, orlistat, propranolol, collagen supplement Relevant labs: 02/06/21 total cholesterol 169, HDL 30, LDL 104, triglycerides 230, HbA1C 5.4% Notable symptoms: back and knee pain, chronic; fatigue Drug allergies: none known Food allergies: none known Bowel Habits: Every day to every other day no complaints   The following the learning objectives were met by the patient during this course: Identifies Phase 3 (Soft, High Proteins) Dietary Goals and will begin from 2 weeks post-operatively to 2 months post-operatively Identifies appropriate sources of fluids and proteins  Identifies appropriate fat sources and healthy verses unhealthy fat types   States protein recommendations and appropriate sources post-operatively Identifies the need for appropriate texture modifications, mastication, and bite sizes when consuming solids Identifies appropriate fat consumption and sources Identifies appropriate multivitamin and calcium sources post-operatively Describes the need for physical activity post-operatively and will follow MD recommendations States when to call healthcare provider regarding medication questions or post-operative complications   Handouts given during class include: Phase 3A: Soft, High Protein Diet Handout Phase 3 High Protein Meals Healthy Fats   Follow-Up Plan: Patient will follow-up at [facility] in 6 weeks for 2 month post-op nutrition visit for diet  advancement per MD.

## 2022-11-20 ENCOUNTER — Ambulatory Visit: Payer: Medicare Other | Admitting: Cardiology

## 2022-11-21 ENCOUNTER — Other Ambulatory Visit: Payer: Self-pay | Admitting: General Surgery

## 2022-11-21 DIAGNOSIS — E860 Dehydration: Secondary | ICD-10-CM

## 2022-11-21 MED ORDER — SODIUM CHLORIDE 0.9 % IV BOLUS: 2000.0000 mL | Freq: Once | INTRAVENOUS | Status: AC

## 2022-11-21 MED ORDER — ONDANSETRON 4 MG PO TBDP: 4.0000 mg | ORAL_TABLET | ORAL | Status: AC | PRN

## 2022-11-21 MED ORDER — SODIUM CHLORIDE 0.9 % IV SOLN: 4.0000 mg | INTRAVENOUS | Status: AC | PRN

## 2022-11-22 ENCOUNTER — Other Ambulatory Visit: Payer: Self-pay

## 2022-11-22 ENCOUNTER — Encounter (HOSPITAL_COMMUNITY): Payer: Self-pay | Admitting: Emergency Medicine

## 2022-11-22 ENCOUNTER — Inpatient Hospital Stay (HOSPITAL_COMMUNITY)
Admission: EM | Admit: 2022-11-22 | Discharge: 2022-11-26 | DRG: 443 | Disposition: A | Discharge status: Home / Self Care | Payer: Medicare Other | Attending: Family Medicine | Admitting: Family Medicine

## 2022-11-22 ENCOUNTER — Emergency Department (HOSPITAL_COMMUNITY): Payer: Medicare Other

## 2022-11-22 DIAGNOSIS — Z9109 Other allergy status, other than to drugs and biological substances: Secondary | ICD-10-CM

## 2022-11-22 DIAGNOSIS — K55069 Acute infarction of intestine, part and extent unspecified: Secondary | ICD-10-CM | POA: Diagnosis not present

## 2022-11-22 DIAGNOSIS — F32A Depression, unspecified: Secondary | ICD-10-CM | POA: Diagnosis present

## 2022-11-22 DIAGNOSIS — Z9884 Bariatric surgery status: Secondary | ICD-10-CM

## 2022-11-22 DIAGNOSIS — R1013 Epigastric pain: Secondary | ICD-10-CM | POA: Diagnosis not present

## 2022-11-22 DIAGNOSIS — M1990 Unspecified osteoarthritis, unspecified site: Secondary | ICD-10-CM | POA: Diagnosis present

## 2022-11-22 DIAGNOSIS — I81 Portal vein thrombosis: Principal | ICD-10-CM | POA: Diagnosis present

## 2022-11-22 DIAGNOSIS — R1310 Dysphagia, unspecified: Secondary | ICD-10-CM | POA: Diagnosis present

## 2022-11-22 DIAGNOSIS — R109 Unspecified abdominal pain: Secondary | ICD-10-CM | POA: Diagnosis not present

## 2022-11-22 DIAGNOSIS — Z888 Allergy status to other drugs, medicaments and biological substances status: Secondary | ICD-10-CM

## 2022-11-22 DIAGNOSIS — Z79899 Other long term (current) drug therapy: Secondary | ICD-10-CM

## 2022-11-22 DIAGNOSIS — F419 Anxiety disorder, unspecified: Secondary | ICD-10-CM | POA: Diagnosis present

## 2022-11-22 DIAGNOSIS — I10 Essential (primary) hypertension: Secondary | ICD-10-CM | POA: Diagnosis present

## 2022-11-22 DIAGNOSIS — K219 Gastro-esophageal reflux disease without esophagitis: Secondary | ICD-10-CM | POA: Diagnosis present

## 2022-11-22 DIAGNOSIS — K5900 Constipation, unspecified: Secondary | ICD-10-CM | POA: Diagnosis present

## 2022-11-22 LAB — URINALYSIS, ROUTINE W REFLEX MICROSCOPIC
Bilirubin Urine: NEGATIVE
Glucose, UA: NEGATIVE mg/dL
Hgb urine dipstick: NEGATIVE
Ketones, ur: 5 mg/dL — AB
Nitrite: NEGATIVE
Protein, ur: NEGATIVE mg/dL
Specific Gravity, Urine: 1.023 (ref 1.005–1.030)
pH: 5 (ref 5.0–8.0)

## 2022-11-22 LAB — CBC
HCT: 40.3 % (ref 36.0–46.0)
Hemoglobin: 12.8 g/dL (ref 12.0–15.0)
MCH: 27.3 pg (ref 26.0–34.0)
MCHC: 31.8 g/dL (ref 30.0–36.0)
MCV: 85.9 fL (ref 80.0–100.0)
Platelets: 371 10*3/uL (ref 150–400)
RBC: 4.69 MIL/uL (ref 3.87–5.11)
RDW: 15.3 % (ref 11.5–15.5)
WBC: 10.4 10*3/uL (ref 4.0–10.5)
nRBC: 0 % (ref 0.0–0.2)

## 2022-11-22 LAB — COMPREHENSIVE METABOLIC PANEL
ALT: 23 U/L (ref 0–44)
AST: 21 U/L (ref 15–41)
Albumin: 3.8 g/dL (ref 3.5–5.0)
Alkaline Phosphatase: 65 U/L (ref 38–126)
Anion gap: 9 (ref 5–15)
BUN: 18 mg/dL (ref 8–23)
CO2: 26 mmol/L (ref 22–32)
Calcium: 9.1 mg/dL (ref 8.9–10.3)
Chloride: 103 mmol/L (ref 98–111)
Creatinine, Ser: 0.99 mg/dL (ref 0.44–1.00)
GFR, Estimated: >60 mL/min (ref >60)
Glucose, Bld: 105 mg/dL — ABNORMAL HIGH (ref 70–99)
Potassium: 4 mmol/L (ref 3.5–5.1)
Sodium: 138 mmol/L (ref 135–145)
Total Bilirubin: 0.9 mg/dL (ref 0.3–1.2)
Total Protein: 8.2 g/dL — ABNORMAL HIGH (ref 6.5–8.1)

## 2022-11-22 LAB — LIPASE, BLOOD: Lipase: 40 U/L (ref 11–51)

## 2022-11-22 MED ORDER — SODIUM CHLORIDE 0.9 % IV SOLN: INTRAVENOUS | Status: AC

## 2022-11-22 MED ORDER — SODIUM CHLORIDE 0.9 % IV SOLN: INTRAVENOUS | Status: DC

## 2022-11-22 MED ORDER — ONDANSETRON HCL 4 MG PO TABS: 4.0000 mg | ORAL_TABLET | Freq: Four times a day (QID) | ORAL | Status: DC | PRN

## 2022-11-22 MED ORDER — ONDANSETRON HCL 4 MG/2ML IJ SOLN
4.0000 mg | Freq: Four times a day (QID) | INTRAMUSCULAR | Status: DC | PRN
  Administered 2022-11-24: 4 mg via INTRAVENOUS
  Filled 2022-11-22: qty 2

## 2022-11-22 MED ORDER — ACETAMINOPHEN 325 MG PO TABS: 650.0000 mg | ORAL_TABLET | Freq: Four times a day (QID) | ORAL | Status: DC | PRN

## 2022-11-22 MED ORDER — HYDROMORPHONE HCL 1 MG/ML IJ SOLN
1.0000 mg | Freq: Once | INTRAMUSCULAR | Status: AC
  Administered 2022-11-22: 1 mg via INTRAVENOUS
  Filled 2022-11-22: qty 1

## 2022-11-22 MED ORDER — PANTOPRAZOLE SODIUM 40 MG PO TBEC
40.0000 mg | DELAYED_RELEASE_TABLET | Freq: Every day | ORAL | Status: DC
  Administered 2022-11-22 – 2022-11-23 (×2): 40 mg via ORAL
  Filled 2022-11-22 (×2): qty 1

## 2022-11-22 MED ORDER — CITALOPRAM HYDROBROMIDE 20 MG PO TABS
40.0000 mg | ORAL_TABLET | Freq: Every day | ORAL | Status: DC
  Administered 2022-11-22 – 2022-11-26 (×5): 40 mg via ORAL
  Filled 2022-11-22 (×5): qty 2

## 2022-11-22 MED ORDER — SODIUM CHLORIDE 0.9 % IV BOLUS
500.0000 mL | Freq: Once | INTRAVENOUS | Status: AC
  Administered 2022-11-22: 500 mL via INTRAVENOUS

## 2022-11-22 MED ORDER — IOHEXOL 300 MG/ML  SOLN
100.0000 mL | Freq: Once | INTRAMUSCULAR | Status: AC | PRN
  Administered 2022-11-22: 100 mL via INTRAVENOUS

## 2022-11-22 MED ORDER — ONDANSETRON HCL 4 MG/2ML IJ SOLN
4.0000 mg | Freq: Once | INTRAMUSCULAR | Status: AC
  Administered 2022-11-22: 4 mg via INTRAVENOUS
  Filled 2022-11-22: qty 2

## 2022-11-22 MED ORDER — HEPARIN (PORCINE) 25000 UT/250ML-% IV SOLN
1650.0000 [IU]/h | INTRAVENOUS | Status: AC
  Administered 2022-11-22: 1500 [IU]/h via INTRAVENOUS
  Administered 2022-11-23 – 2022-11-25 (×4): 1650 [IU]/h via INTRAVENOUS
  Filled 2022-11-22 (×6): qty 250

## 2022-11-22 MED ORDER — HEPARIN BOLUS VIA INFUSION
3000.0000 [IU] | Freq: Once | INTRAVENOUS | Status: AC
  Administered 2022-11-22: 3000 [IU] via INTRAVENOUS
  Filled 2022-11-22: qty 3000

## 2022-11-22 MED ORDER — HYDROMORPHONE HCL 1 MG/ML IJ SOLN
1.0000 mg | INTRAMUSCULAR | Status: DC | PRN
  Administered 2022-11-22 – 2022-11-24 (×7): 1 mg via INTRAVENOUS
  Filled 2022-11-22 (×7): qty 1

## 2022-11-22 MED ORDER — PROPRANOLOL HCL 20 MG PO TABS
40.0000 mg | ORAL_TABLET | Freq: Two times a day (BID) | ORAL | Status: DC
  Administered 2022-11-22 – 2022-11-26 (×8): 40 mg via ORAL
  Filled 2022-11-22 (×8): qty 2

## 2022-11-22 MED ORDER — ACETAMINOPHEN 650 MG RE SUPP: 650.0000 mg | Freq: Four times a day (QID) | RECTAL | Status: DC | PRN

## 2022-11-22 NOTE — Consult Note (Signed)
      Chief Complaint/Subjective: She had been having difficulty with food and feeling like food was stacking in her throat and getting dehydrated. She started having a new abdominal pain yesterday that progressed throughout today which is why she came to the ED.  Objective: Vital signs in last 24 hours: Temp:  [98.2 F (36.8 C)-99.9 F (37.7 C)] 98.2 F (36.8 C) (12/22 1700) Pulse Rate:  [65-87] 65 (12/22 1700) Resp:  [14-18] 16 (12/22 1700) BP: (117-158)/(52-99) 117/77 (12/22 1700) SpO2:  [83 %-99 %] 98 % (12/22 1700)   Intake/Output from previous day: No intake/output data recorded.  PE: Gen: NAd Resp: nonlabored Card: RRR Abd: soft, incisions c/d/i  Lab Results:  Recent Labs    11/22/22 1143  WBC 10.4  HGB 12.8  HCT 40.3  PLT 371   Recent Labs    11/22/22 1143  NA 138  K 4.0  CL 103  CO2 26  GLUCOSE 105*  BUN 18  CREATININE 0.99  CALCIUM 9.1   No results for input(s): "LABPROT", "INR" in the last 72 hours.    Component Value Date/Time   NA 138 11/22/2022 1143   NA 140 08/07/2021 0952   K 4.0 11/22/2022 1143   CL 103 11/22/2022 1143   CO2 26 11/22/2022 1143   GLUCOSE 105 (H) 11/22/2022 1143   BUN 18 11/22/2022 1143   BUN 12 08/07/2021 0952   CREATININE 0.99 11/22/2022 1143   CALCIUM 9.1 11/22/2022 1143   PROT 8.2 (H) 11/22/2022 1143   PROT 7.0 08/07/2021 0952   ALBUMIN 3.8 11/22/2022 1143   ALBUMIN 4.1 08/07/2021 0952   AST 21 11/22/2022 1143   ALT 23 11/22/2022 1143   ALKPHOS 65 11/22/2022 1143   BILITOT 0.9 11/22/2022 1143   BILITOT 0.6 08/07/2021 0952   GFRNONAA >60 11/22/2022 1143    Anti-infectives: Anti-infectives (From admission, onward)    None       Assessment/Plan 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  This care required high  level of medical decision making.   [facility] 11/22/2022, 5:08 PM Please see Amion for pager number during day hours 7:00am-4:30pm or 7:00am -11:30am on weekends

## 2022-11-22 NOTE — Progress Notes (Signed)
[place] for IV heparin Indication: SM/portal vein thrombosis  Allergies  Allergen Reactions   Other Itching and Other (See Comments)    Antidepressants-Itching, Gaining Weight Tolerates citalopram     Patient Measurements:   Heparin Dosing Weight: 89 kg (used [place] nomogram for initial dosing)  Vital Signs: Temp: 98.2 F (36.8 C) (12/22 1700) Temp Source: Oral (12/22 1700) BP: 117/77 (12/22 1700) Pulse Rate: 65 (12/22 1700)  Labs: Recent Labs    11/22/22 1143  HGB 12.8  HCT 40.3  PLT 371  CREATININE 0.99    Estimated Creatinine Clearance: 70.7 mL/min (by C-G formula based on SCr of 0.99 mg/dL).   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 No plans for vascular intervention at this time; Pharmacy to dose IV heparin  Baseline INR, aPTT: not done Prior anticoagulation: none  Significant events:  Today, 11/22/2022: CBC: WNL SCr WNL & at baseline No bleeding or infusion issues per nursing  Goal of Therapy: Heparin level 0.3-0.7 units/ml Monitor platelets by anticoagulation protocol: Yes  Plan: Heparin 3000 units IV bolus x 1 Heparin 1500 units/hr IV infusion Check heparin level 8 hrs after start (Pt < 70 but will allow extra time for tissue distribution given body habitus) Daily CBC, daily heparin level once stable Monitor for signs of bleeding or thrombosis F/u plans for long-term anticoag  (anticipate DOAC)  Reuel Boom, PharmD, BCPS [phone] 11/22/2022, 5:44 PM

## 2022-11-22 NOTE — ED Triage Notes (Signed)
Pt reports generalized abdominal pain that started last night. Pt reports nausea. Pt also reports she had gastric sleeve surgery on the 12/5.

## 2022-11-22 NOTE — H&P (Signed)
TRH H&P    Patient Demographics:    Kathryn Compton, is a 71 y.o. female  MRN: [id number]  DOB - May 19, 1951  Admit Date - 11/22/2022  Referring MD/NP/PA: Ria Comment  Outpatient Primary MD for the patient is Carrolyn Meiers, MD  Patient coming from: Home  Chief complaint-abdominal pain   HPI:    Kathryn Compton  is a 71 y.o. female, with history of hypertension, depression came to ED with abdominal pain.  Patient recently had gastric sleeve surgery on December 5 done by Dr. Alvino Blood.  Patient started having significant epigastric abdominal pain since last night.  Patient states that she was having intermittent nausea and abdominal pain since surgery but pain became worse last night. Complains of nausea but no vomiting.  Denies diarrhea. Denies chest pain or shortness of breath Denies dysuria  In the ED CT abdomen showed thrombosis of SMV and portal vein.  General surgery was consulted, vascular surgery Dr. Doren Custard was called, he did not require any intervention.  Recommended anticoagulation with heparin.    Review of systems:    In addition to the HPI above,   All other systems reviewed and are negative.    Past History of the following :    Past Medical History:  Diagnosis Date   Anxiety    Arthritis    Depression    GERD (gastroesophageal reflux disease)    Headache    Hypertension    Motion sickness    Palpitations    Plantar fasciitis    Seizures ([place])    Vertigo       Past Surgical History:  Procedure Laterality Date   BREAST REDUCTION SURGERY     COLONOSCOPY N/A 09/06/2019   Procedure: COLONOSCOPY;  Surgeon: Danie Binder, MD;  Location: [facility];  Service: Endoscopy;  Laterality: N/A;  2:00   LAPAROSCOPIC GASTRIC SLEEVE RESECTION N/A 11/05/2022   Procedure: LAPAROSCOPIC SLEEVE GASTRECTOMY;  Surgeon: Kieth Brightly Arta Bruce, MD;  Location: [facility];  Service:  General;  Laterality: N/A;   POLYPECTOMY  09/06/2019   Procedure: POLYPECTOMY;  Surgeon: Danie Binder, MD;  Location: [facility];  Service: Endoscopy;;   REDUCTION MAMMAPLASTY     TUBAL LIGATION     UPPER GI ENDOSCOPY N/A 11/05/2022   Procedure: UPPER GI ENDOSCOPY;  Surgeon: Kieth Brightly Arta Bruce, MD;  Location: [facility];  Service: General;  Laterality: N/A;      Social History:      Social History   Tobacco Use   Smoking status: Never   Smokeless tobacco: Never  Substance Use Topics   Alcohol use: Never       Family History :     Family History  Problem Relation Age of Onset   Other Mother        unsure of medical history   Other Father        unknown medical history   Drug abuse Brother    Suicidality Brother    Colon cancer Neg Hx       Home Medications:  Prior to Admission medications   Medication Sig Start Date End Date Taking? Authorizing Provider  CALCIUM PO Take 1 tablet by mouth daily.    [provider]  citalopram (CELEXA) 20 MG tablet Take 20 mg by mouth daily as needed (anxiety).    [provider]  citalopram (CELEXA) 40 MG tablet Take 40 mg by mouth daily. 01/11/22   [provider]  COLLAGEN PO Take 1 Scoop by mouth daily.    [provider]  Erenumab-aooe (AIMOVIG) 70 MG/ML SOAJ Inject 70 mg into the skin every 30 (thirty) days. 08/19/22   Marcial Pacas, MD  fluticasone (CUTIVATE) 0.05 % cream Apply 1 application  topically daily as needed (rash). 04/19/22   [provider]  hydrOXYzine (ATARAX) 25 MG tablet Take 6.25 mg by mouth at bedtime as needed (sleep).    [provider]  ketotifen (ZADITOR) 0.035 % ophthalmic solution Place 1 drop into both eyes 2 (two) times daily as needed (itching).    [provider]  Multiple Vitamin (MULTIVITAMIN WITH MINERALS) TABS tablet Take 1 tablet by mouth daily.    [provider]  ondansetron (ZOFRAN-ODT) 4 MG disintegrating tablet Take 1  tablet (4 mg total) by mouth every 6 (six) hours as needed for nausea or vomiting. 11/07/22   Kinsinger, Arta Bruce, MD  oxyCODONE (OXY IR/ROXICODONE) 5 MG immediate release tablet Take 1 tablet (5 mg total) by mouth every 6 (six) hours as needed for severe pain. 11/07/22   Kinsinger, Arta Bruce, MD  pantoprazole (PROTONIX) 40 MG tablet Take 1 tablet (40 mg total) by mouth daily. 11/07/22   Kinsinger, Arta Bruce, MD  propranolol (INDERAL) 40 MG tablet Take 1 tablet (40 mg total) by mouth 2 (two) times daily. 09/02/22   Arnoldo Lenis, MD  VITAMIN D PO Take 1 capsule by mouth daily.    [provider]     Allergies:     Allergies  Allergen Reactions   Other Itching and Other (See Comments)    Antidepressants-Itching, Gaining Weight Tolerates citalopram      Physical Exam:   Vitals  Blood pressure 117/77, pulse 65, temperature 98.2 F (36.8 C), temperature source Oral, resp. rate 16, SpO2 98 %.  1.  General: Appears in no acute distress  2. Psychiatric: Alert, oriented x 3, intact insight and judgment  3. Neurologic: Cranial nerves II through XII grossly intact, no focal deficit noted, moving all extremities  4. HEENMT:  Atraumatic normocephalic, extraocular muscles are intact  5. Respiratory : Clear to auscultation bilaterally  6. Cardiovascular : S1-S2, regular, no murmur auscultated  7. Gastrointestinal:  Abdomen is soft, nontender, no organomegaly  8. Skin:  No rashes noted     Data Review:    CBC Recent Labs  Lab 11/22/22 1143  WBC 10.4  HGB 12.8  HCT 40.3  PLT 371  MCV 85.9  MCH 27.3  MCHC 31.8  RDW 15.3   ------------------------------------------------------------------------------------------------------------------  Results for orders placed or performed during the hospital encounter of 11/22/22 (from the past 48 hour(s))  Lipase, blood     Status: None   Collection Time: 11/22/22 11:43 AM  Result Value Ref Range   Lipase 40 11 -  51 U/L    Comment: Performed at [facility], [place] [address] [place], [place] [address]  Comprehensive metabolic panel     Status: Abnormal   Collection Time: 11/22/22 11:43 AM  Result Value Ref Range   Sodium 138 135 -  145 mmol/L   Potassium 4.0 3.5 - 5.1 mmol/L   Chloride 103 98 - 111 mmol/L   CO2 26 22 - 32 mmol/L   Glucose, Bld 105 (H) 70 - 99 mg/dL    Comment: Glucose reference range applies only to samples taken after fasting for at least 8 hours.   BUN 18 8 - 23 mg/dL   Creatinine, Ser 0.99 0.44 - 1.00 mg/dL   Calcium 9.1 8.9 - 10.3 mg/dL   Total Protein 8.2 (H) 6.5 - 8.1 g/dL   Albumin 3.8 3.5 - 5.0 g/dL   AST 21 15 - 41 U/L   ALT 23 0 - 44 U/L   Alkaline Phosphatase 65 38 - 126 U/L   Total Bilirubin 0.9 0.3 - 1.2 mg/dL   GFR, Estimated >60 >60 mL/min    Comment: (NOTE) Calculated using the CKD-EPI Creatinine Equation (2021)    Anion gap 9 5 - 15    Comment: Performed at [facility], [place] [address], [place], [place] [address]  CBC     Status: None   Collection Time: 11/22/22 11:43 AM  Result Value Ref Range   WBC 10.4 4.0 - 10.5 K/uL   RBC 4.69 3.87 - 5.11 MIL/uL   Hemoglobin 12.8 12.0 - 15.0 g/dL   HCT 40.3 36.0 - 46.0 %   MCV 85.9 80.0 - 100.0 fL   MCH 27.3 26.0 - 34.0 pg   MCHC 31.8 30.0 - 36.0 g/dL   RDW 15.3 11.5 - 15.5 %   Platelets 371 150 - 400 K/uL   nRBC 0.0 0.0 - 0.2 %    Comment: Performed at [facility], [place] [address] [place], [place] [address]  Urinalysis, Routine w reflex microscopic Urine, Clean Catch     Status: Abnormal   Collection Time: 11/22/22 11:43 AM  Result Value Ref Range   Color, Urine AMBER (A) YELLOW    Comment: BIOCHEMICALS MAY BE AFFECTED BY COLOR   APPearance HAZY (A) CLEAR   Specific Gravity, Urine 1.023 1.005 - 1.030   pH 5.0 5.0 - 8.0   Glucose, UA NEGATIVE NEGATIVE mg/dL   Hgb urine dipstick NEGATIVE NEGATIVE   Bilirubin Urine NEGATIVE NEGATIVE   Ketones, ur 5  (A) NEGATIVE mg/dL   Protein, ur NEGATIVE NEGATIVE mg/dL   Nitrite NEGATIVE NEGATIVE   Leukocytes,Ua MODERATE (A) NEGATIVE   RBC / HPF 0-5 0 - 5 RBC/hpf   WBC, UA 11-20 0 - 5 WBC/hpf   Bacteria, UA RARE (A) NONE SEEN   Squamous Epithelial / LPF 0-5 0 - 5   Mucus PRESENT    Hyaline Casts, UA PRESENT     Comment: Performed at [facility], [place] [address] [place], [place] [address]    Chemistries  Recent Labs  Lab 11/22/22 1143  NA 138  K 4.0  CL 103  CO2 26  GLUCOSE 105*  BUN 18  CREATININE 0.99  CALCIUM 9.1  AST 21  ALT 23  ALKPHOS 65  BILITOT 0.9   ------------------------------------------------------------------------------------------------------------------  ------------------------------------------------------------------------------------------------------------------ GFR: Estimated Creatinine Clearance: 70.7 mL/min (by C-G formula based on SCr of 0.99 mg/dL). Liver Function Tests: Recent Labs  Lab 11/22/22 1143  AST 21  ALT 23  ALKPHOS 65  BILITOT 0.9  PROT 8.2*  ALBUMIN 3.8   Recent Labs  Lab 11/22/22 1143  LIPASE 40   No results for input(s): "AMMONIA" in the last 168 hours. Coagulation Profile: No results for input(s): "INR", "PROTIME" in the  last 168 hours. Cardiac Enzymes: No results for input(s): "CKTOTAL", "CKMB", "CKMBINDEX", "TROPONINI" in the last 168 hours. BNP (last 3 results) No results for input(s): "PROBNP" in the last 8760 hours. HbA1C: No results for input(s): "HGBA1C" in the last 72 hours. CBG: No results for input(s): "GLUCAP" in the last 168 hours. Lipid Profile: No results for input(s): "CHOL", "HDL", "LDLCALC", "TRIG", "CHOLHDL", "LDLDIRECT" in the last 72 hours. Thyroid Function Tests: No results for input(s): "TSH", "T4TOTAL", "FREET4", "T3FREE", "THYROIDAB" in the last 72 hours. Anemia Panel: No results for input(s): "VITAMINB12", "FOLATE", "FERRITIN", "TIBC", "IRON", "RETICCTPCT" in the last 72  hours.  --------------------------------------------------------------------------------------------------------------- Urine analysis:    Component Value Date/Time   COLORURINE AMBER (A) 11/22/2022 1143   APPEARANCEUR HAZY (A) 11/22/2022 1143   LABSPEC 1.023 11/22/2022 1143   PHURINE 5.0 11/22/2022 1143   GLUCOSEU NEGATIVE 11/22/2022 1143   HGBUR NEGATIVE 11/22/2022 1143   BILIRUBINUR NEGATIVE 11/22/2022 1143   KETONESUR 5 (A) 11/22/2022 1143   PROTEINUR NEGATIVE 11/22/2022 1143   NITRITE NEGATIVE 11/22/2022 1143   LEUKOCYTESUR MODERATE (A) 11/22/2022 1143      Imaging Results:    CT Abdomen Pelvis W Contrast  Result Date: 11/22/2022 CLINICAL DATA:  Abdominal pain and nausea since yesterday, history of bariatric surgery 11/05/2022 EXAM: CT ABDOMEN AND PELVIS WITH CONTRAST TECHNIQUE: Multidetector CT imaging of the abdomen and pelvis was performed using the standard protocol following bolus administration of intravenous contrast. RADIATION DOSE REDUCTION: This exam was performed according to the departmental dose-optimization program which includes automated exposure control, adjustment of the mA and/or kV according to patient size and/or use of iterative reconstruction technique. CONTRAST:  130m OMNIPAQUE IOHEXOL 300 MG/ML  SOLN COMPARISON:  None Available. FINDINGS: Lower chest: No acute pleural or parenchymal lung disease. Hepatobiliary: There is acute appearing thrombosis of the intrahepatic portal venous system, extending to the porta hepatis. There is normal opacification of the portal vein between the splenic confluence of the porta hepatis, as well as normal opacification of the splenic vein. The SMV demonstrates acute thrombosis. Otherwise the liver is unremarkable. The gallbladder is grossly normal. No biliary duct dilation. Pancreas: Unremarkable. No pancreatic ductal dilatation or surrounding inflammatory changes. Spleen: Normal in size without focal abnormality.  Adrenals/Urinary Tract: Adrenal glands are unremarkable. Kidneys are normal, without renal calculi, focal solid lesion, or hydronephrosis. Bladder is unremarkable. Stomach/Bowel: No bowel obstruction or ileus. Normal appendix right lower quadrant. Postsurgical changes from gastric sleeve procedure. No bowel wall thickening. Vascular/Lymphatic: There is acute thrombosis of the SMV and its tributaries. The splenic vein and extrahepatic main portal vein are patent. There is diffuse thrombosis of the intrahepatic portal venous system, with relative sparing of the posterior aspect of the right lobe. Atherosclerosis of the abdominal aorta and its branches. No pathologic adenopathy. Reproductive: Uterus and bilateral adnexa are unremarkable. Other: There is mesenteric edema associated with the SMV thrombosis. No free fluid or free intraperitoneal gas. Postsurgical changes within the abdominal wall consistent with recent laparoscopic procedure. No fluid collection or hematoma. No abdominal wall hernia. Musculoskeletal: No acute or destructive bony lesions. Reconstructed images demonstrate no additional findings. IMPRESSION: 1. 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 2. Postsurgical changes from recent gastric sleeve procedure. No bowel obstruction or ileus. Critical Value/emergent results were called by telephone at the time of interpretation on 11/22/2022 at 3:22 pm to provider SFredia Sorrow, who verbally acknowledged these results. Electronically Signed   By: MRanda Ngo  M.D.   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 Doren Custard was consulted, he did not recommend any intervention.  Patient will need to be discharged on DOAC. Abdominal pain-patient had recent gastric sleeve surgery, start Dilaudid 1 mg IV  every 4 hours as needed for pain.  General surgery has been consulted.  Continue Protonix 40 mg p.o. daily. Depression-continue Celexa Hypertension-continue propranolol    DVT Prophylaxis-   heparin  AM Labs Ordered, also please review Full Orders  Family Communication: Admission, patients condition and plan of care including tests being ordered have been discussed with the patient  who indicate understanding and agree with the plan and Code Status.  Code Status: Full code  Admission status: Observation      Time spent in minutes : 60 minutes   Sareen Randon S Kalyn Hofstra M.D

## 2022-11-22 NOTE — ED Provider Notes (Signed)
[facility] DEPT Provider Note   CSN: [id number] Arrival date & time: 11/22/22  1124     History  Chief Complaint  Patient presents with   Abdominal Pain    Kathryn Compton is a 71 y.o. female.  Patient status post gastric sleeve surgery December 5 done by Dr. Alvino Blood.  Patient started with pretty significant epigastric abdominal pain and abdominal pain all over last night.  But actually did not feel great ever since the surgery was done struggled with advancing to regular diet.  So pretty much as stated on liquids.  Had a lot of nausea.  But never had any vomiting.  Patient not on any blood thinners.  Past medical history significant for depression anxiety vertigo hypertension and seizures palpitations gastroesophageal reflux disease.  Patient is never used tobacco products.  Patient vital signs on arrival reassuring Temp was 98.5 pulse 87 respirations 18 oxygen sats 99%.  Blood pressure was 158/76.       Home Medications Prior to Admission medications   Medication Sig Start Date End Date Taking? Authorizing Provider  CALCIUM PO Take 1 tablet by mouth daily.    [provider]  citalopram (CELEXA) 20 MG tablet Take 20 mg by mouth daily as needed (anxiety).    [provider]  citalopram (CELEXA) 40 MG tablet Take 40 mg by mouth daily. 01/11/22   [provider]  COLLAGEN PO Take 1 Scoop by mouth daily.    [provider]  Erenumab-aooe (AIMOVIG) 70 MG/ML SOAJ Inject 70 mg into the skin every 30 (thirty) days. 08/19/22   Marcial Pacas, MD  fluticasone (CUTIVATE) 0.05 % cream Apply 1 application  topically daily as needed (rash). 04/19/22   [provider]  hydrOXYzine (ATARAX) 25 MG tablet Take 6.25 mg by mouth at bedtime as needed (sleep).    [provider]  ketotifen (ZADITOR) 0.035 % ophthalmic solution Place 1 drop into both eyes 2 (two) times daily as needed (itching).    [provider]   Multiple Vitamin (MULTIVITAMIN WITH MINERALS) TABS tablet Take 1 tablet by mouth daily.    [provider]  ondansetron (ZOFRAN-ODT) 4 MG disintegrating tablet Take 1 tablet (4 mg total) by mouth every 6 (six) hours as needed for nausea or vomiting. 11/07/22   Kinsinger, Arta Bruce, MD  oxyCODONE (OXY IR/ROXICODONE) 5 MG immediate release tablet Take 1 tablet (5 mg total) by mouth every 6 (six) hours as needed for severe pain. 11/07/22   Kinsinger, Arta Bruce, MD  pantoprazole (PROTONIX) 40 MG tablet Take 1 tablet (40 mg total) by mouth daily. 11/07/22   Kinsinger, Arta Bruce, MD  propranolol (INDERAL) 40 MG tablet Take 1 tablet (40 mg total) by mouth 2 (two) times daily. 09/02/22   Arnoldo Lenis, MD  VITAMIN D PO Take 1 capsule by mouth daily.    [provider]      Allergies    Other    Review of Systems   Review of Systems  Constitutional:  Negative for chills and fever.  HENT:  Negative for rhinorrhea and sore throat.   Eyes:  Negative for visual disturbance.  Respiratory:  Negative for cough and shortness of breath.   Cardiovascular:  Negative for chest pain and leg swelling.  Gastrointestinal:  Positive for abdominal pain and nausea. Negative for diarrhea and vomiting.  Genitourinary:  Negative for dysuria.  Musculoskeletal:  Negative for back pain and neck pain.  Skin:  Negative for rash.  Neurological:  Negative for dizziness, light-headedness and headaches.  Hematological:  Does not bruise/bleed easily.  Psychiatric/Behavioral:  Negative for confusion.     Physical Exam Updated Vital Signs BP (!) 146/53 (BP Location: Right Arm)   Pulse 75   Temp 99.9 F (37.7 C) (Oral)   Resp 14   SpO2 95%  Physical Exam Vitals and nursing note reviewed.  Constitutional:      General: She is not in acute distress.    Appearance: She is well-developed. She is obese. She is not ill-appearing or toxic-appearing.  HENT:     Head: Normocephalic and atraumatic.   Eyes:     Extraocular Movements: Extraocular movements intact.     Conjunctiva/sclera: Conjunctivae normal.     Pupils: Pupils are equal, round, and reactive to light.  Cardiovascular:     Rate and Rhythm: Normal rate and regular rhythm.     Heart sounds: No murmur heard. Pulmonary:     Effort: Pulmonary effort is normal. No respiratory distress.     Breath sounds: Normal breath sounds.  Abdominal:     Palpations: Abdomen is soft.     Tenderness: There is no abdominal tenderness.     Hernia: No hernia is present.     Comments: Abdomen soft nontender to palpation laparoscopic surgical scars healing well.  Definitely no guarding.  No signs of infection.  Musculoskeletal:        General: No swelling.     Cervical back: Neck supple.  Skin:    General: Skin is warm and dry.     Capillary Refill: Capillary refill takes less than 2 seconds.  Neurological:     General: No focal deficit present.     Mental Status: She is alert and oriented to person, place, and time.  Psychiatric:        Mood and Affect: Mood normal.     ED Results / Procedures / Treatments   Labs (all labs ordered are listed, but only abnormal results are displayed) Labs Reviewed  COMPREHENSIVE METABOLIC PANEL - Abnormal; Notable for the following components:      Result Value   Glucose, Bld 105 (*)    Total Protein 8.2 (*)    All other components within normal limits  URINALYSIS, ROUTINE W REFLEX MICROSCOPIC - Abnormal; Notable for the following components:   Color, Urine AMBER (*)    APPearance HAZY (*)    Ketones, ur 5 (*)    Leukocytes,Ua MODERATE (*)    Bacteria, UA RARE (*)    All other components within normal limits  LIPASE, BLOOD  CBC  HEPARIN LEVEL (UNFRACTIONATED)    EKG None  Radiology CT Abdomen Pelvis W Contrast  Result Date: 11/22/2022 CLINICAL DATA:  Abdominal pain and nausea since yesterday, history of bariatric surgery 11/05/2022 EXAM: CT ABDOMEN AND PELVIS WITH CONTRAST TECHNIQUE:  Multidetector CT imaging of the abdomen and pelvis was performed using the standard protocol following bolus administration of intravenous contrast. RADIATION DOSE REDUCTION: This exam was performed according to the departmental dose-optimization program which includes automated exposure control, adjustment of the mA and/or kV according to patient size and/or use of iterative reconstruction technique. CONTRAST:  155m OMNIPAQUE IOHEXOL 300 MG/ML  SOLN COMPARISON:  None Available. FINDINGS: Lower chest: No acute pleural or parenchymal lung disease. Hepatobiliary: There is acute appearing thrombosis of the intrahepatic portal venous system, extending to the porta hepatis. There is normal opacification of the portal vein between the splenic confluence of the porta hepatis, as well  as normal opacification of the splenic vein. The SMV demonstrates acute thrombosis. Otherwise the liver is unremarkable. The gallbladder is grossly normal. No biliary duct dilation. Pancreas: Unremarkable. No pancreatic ductal dilatation or surrounding inflammatory changes. Spleen: Normal in size without focal abnormality. Adrenals/Urinary Tract: Adrenal glands are unremarkable. Kidneys are normal, without renal calculi, focal solid lesion, or hydronephrosis. Bladder is unremarkable. Stomach/Bowel: No bowel obstruction or ileus. Normal appendix right lower quadrant. Postsurgical changes from gastric sleeve procedure. No bowel wall thickening. Vascular/Lymphatic: There is acute thrombosis of the SMV and its tributaries. The splenic vein and extrahepatic main portal vein are patent. There is diffuse thrombosis of the intrahepatic portal venous system, with relative sparing of the posterior aspect of the right lobe. Atherosclerosis of the abdominal aorta and its branches. No pathologic adenopathy. Reproductive: Uterus and bilateral adnexa are unremarkable. Other: There is mesenteric edema associated with the SMV thrombosis. No free fluid or free  intraperitoneal gas. Postsurgical changes within the abdominal wall consistent with recent laparoscopic procedure. No fluid collection or hematoma. No abdominal wall hernia. Musculoskeletal: No acute or destructive bony lesions. Reconstructed images demonstrate no additional findings. IMPRESSION: 1. 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 2. Postsurgical changes from recent gastric sleeve procedure. No bowel obstruction or ileus. Critical Value/emergent results were called by telephone at the time of interpretation on 11/22/2022 at 3:22 pm to provider Fredia Sorrow , who verbally acknowledged these results. Electronically Signed   By: Randa Ngo M.D.   On: 11/22/2022 15:25    Procedures Procedures    Medications Ordered in ED Medications  0.9 %  sodium chloride infusion ( Intravenous New Bag/Given 11/22/22 1511)  heparin bolus via infusion 3,000 Units (has no administration in time range)  heparin ADULT infusion 100 units/mL (25000 units/232m) (has no administration in time range)  sodium chloride 0.9 % bolus 500 mL (500 mLs Intravenous New Bag/Given 11/22/22 1508)  ondansetron (ZOFRAN) injection 4 mg (4 mg Intravenous Given 11/22/22 1507)  HYDROmorphone (DILAUDID) injection 1 mg (1 mg Intravenous Given 11/22/22 1507)  iohexol (OMNIPAQUE) 300 MG/ML solution 100 mL (100 mLs Intravenous Contrast Given 11/22/22 1447)    ED Course/ Medical Decision Making/ A&P                           Medical Decision Making Amount and/or Complexity of Data Reviewed Labs: ordered. Radiology: ordered.  Risk Prescription drug management. Decision regarding hospitalization.   Patient labs here are very reassuring lipase normal complete metabolic panel normal including liver function test.  CBC no leukocytosis hemoglobin 12.8 platelets 371.  Urinalysis a little bit of moderate leukocytes.  But not consistent with urinary tract infection.  CT scan of  the abdomen had very interesting findings acute thrombosis of the superior mesenteric vein and the intrahepatic branches of the portal vein.  Post surgical changes from recent gastric sleeve procedure no bowel obstruction or ileus.  Based on this CT finding discussed with [facility]  And he recommended talking to vascular surgery we discussed with Dr. DScot Dockfrom vascular surgery says the treatment is anticoagulation with heparin usually does not require any intervention.  But obviously has some risk for bleeding possibly postoperatively.  Dr. TGeorgette Doverknew that she would be anticoagulated.  [facility] surgery [facility]  [facility]  General surgery will follow on consult.  Heparin ordered with pharmacy consult.  CRITICAL CARE Performed by: Fredia Sorrow Total critical care time: 40.  Mesenteric vein minutes Critical care time was exclusive of separately billable procedures and treating other patients. Critical care was necessary to treat or prevent imminent or life-threatening deterioration. Critical care was time spent personally by me on the following activities: development of treatment plan with patient and/or surrogate as well as nursing, discussions with consultants, evaluation of patient's response to treatment, examination of patient, obtaining history from patient or surrogate, ordering and performing treatments and interventions, ordering and review of laboratory studies, ordering and review of radiographic studies, pulse oximetry and re-evaluation of patient's condition.  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

## 2022-11-23 DIAGNOSIS — F419 Anxiety disorder, unspecified: Secondary | ICD-10-CM | POA: Diagnosis present

## 2022-11-23 DIAGNOSIS — K219 Gastro-esophageal reflux disease without esophagitis: Secondary | ICD-10-CM | POA: Diagnosis present

## 2022-11-23 DIAGNOSIS — F32A Depression, unspecified: Secondary | ICD-10-CM | POA: Diagnosis present

## 2022-11-23 DIAGNOSIS — K5900 Constipation, unspecified: Secondary | ICD-10-CM | POA: Diagnosis present

## 2022-11-23 DIAGNOSIS — M1990 Unspecified osteoarthritis, unspecified site: Secondary | ICD-10-CM | POA: Diagnosis present

## 2022-11-23 DIAGNOSIS — R1013 Epigastric pain: Secondary | ICD-10-CM

## 2022-11-23 DIAGNOSIS — K55069 Acute infarction of intestine, part and extent unspecified: Secondary | ICD-10-CM | POA: Diagnosis not present

## 2022-11-23 DIAGNOSIS — Z9109 Other allergy status, other than to drugs and biological substances: Secondary | ICD-10-CM | POA: Diagnosis not present

## 2022-11-23 DIAGNOSIS — Z79899 Other long term (current) drug therapy: Secondary | ICD-10-CM | POA: Diagnosis not present

## 2022-11-23 DIAGNOSIS — I81 Portal vein thrombosis: Secondary | ICD-10-CM | POA: Diagnosis not present

## 2022-11-23 DIAGNOSIS — Z888 Allergy status to other drugs, medicaments and biological substances status: Secondary | ICD-10-CM | POA: Diagnosis not present

## 2022-11-23 DIAGNOSIS — R1310 Dysphagia, unspecified: Secondary | ICD-10-CM | POA: Diagnosis present

## 2022-11-23 DIAGNOSIS — Z9884 Bariatric surgery status: Secondary | ICD-10-CM | POA: Diagnosis not present

## 2022-11-23 DIAGNOSIS — I10 Essential (primary) hypertension: Secondary | ICD-10-CM | POA: Diagnosis present

## 2022-11-23 LAB — COMPREHENSIVE METABOLIC PANEL
ALT: 19 U/L (ref 0–44)
AST: 16 U/L (ref 15–41)
Albumin: 3 g/dL — ABNORMAL LOW (ref 3.5–5.0)
Alkaline Phosphatase: 57 U/L (ref 38–126)
Anion gap: 8 (ref 5–15)
BUN: 10 mg/dL (ref 8–23)
CO2: 24 mmol/L (ref 22–32)
Calcium: 8.5 mg/dL — ABNORMAL LOW (ref 8.9–10.3)
Chloride: 107 mmol/L (ref 98–111)
Creatinine, Ser: 0.81 mg/dL (ref 0.44–1.00)
GFR, Estimated: >60 mL/min (ref >60)
Glucose, Bld: 88 mg/dL (ref 70–99)
Potassium: 3.6 mmol/L (ref 3.5–5.1)
Sodium: 139 mmol/L (ref 135–145)
Total Bilirubin: 0.8 mg/dL (ref 0.3–1.2)
Total Protein: 6.5 g/dL (ref 6.5–8.1)

## 2022-11-23 LAB — HIV ANTIBODY (ROUTINE TESTING W REFLEX): HIV Screen 4th Generation wRfx: NONREACTIVE

## 2022-11-23 LAB — CBC
HCT: 36.9 % (ref 36.0–46.0)
Hemoglobin: 11.8 g/dL — ABNORMAL LOW (ref 12.0–15.0)
MCH: 28 pg (ref 26.0–34.0)
MCHC: 32 g/dL (ref 30.0–36.0)
MCV: 87.6 fL (ref 80.0–100.0)
Platelets: 272 10*3/uL (ref 150–400)
RBC: 4.21 MIL/uL (ref 3.87–5.11)
RDW: 15.3 % (ref 11.5–15.5)
WBC: 9.6 10*3/uL (ref 4.0–10.5)
nRBC: 0 % (ref 0.0–0.2)

## 2022-11-23 LAB — HEPARIN LEVEL (UNFRACTIONATED)
Heparin Unfractionated: 0.27 IU/mL — ABNORMAL LOW (ref 0.30–0.70)
Heparin Unfractionated: 0.37 IU/mL (ref 0.30–0.70)
Heparin Unfractionated: 0.46 IU/mL (ref 0.30–0.70)

## 2022-11-23 MED ORDER — ACETAMINOPHEN 160 MG/5ML PO SOLN
650.0000 mg | Freq: Four times a day (QID) | ORAL | Status: DC | PRN
  Administered 2022-11-23: 650 mg via ORAL
  Filled 2022-11-23: qty 20.3

## 2022-11-23 MED ORDER — SODIUM CHLORIDE 0.9 % IV SOLN: INTRAVENOUS | Status: AC

## 2022-11-23 MED ORDER — BOOST / RESOURCE BREEZE PO LIQD CUSTOM
1.0000 | Freq: Three times a day (TID) | ORAL | Status: DC
  Administered 2022-11-23 – 2022-11-26 (×8): 1 via ORAL

## 2022-11-23 NOTE — Progress Notes (Signed)
Kathryn Compton for IV heparin Indication: SM/portal vein thrombosis  Allergies  Allergen Reactions   Other Itching and Other (See Comments)    Antidepressants-Itching, Gaining Weight Tolerates citalopram     Patient Measurements:   Heparin Dosing Weight: 89 kg (used [place] nomogram for initial dosing)  Vital Signs: Temp: 97.3 F (36.3 C) (12/23 0607) Temp Source: Oral (12/23 0607) BP: 132/52 (12/23 0607) Pulse Rate: 63 (12/23 0607)  Labs: Recent Labs    11/22/22 1143 11/23/22 0246 11/23/22 0732 11/23/22 1204  HGB 12.8 11.8*  --   --   HCT 40.3 36.9  --   --   PLT 371 272  --   --   HEPARINUNFRC  --  0.27*  --  0.37  CREATININE 0.99  --  0.81  --      Estimated Creatinine Clearance: 86.4 mL/min (by C-G formula based on SCr of 0.81 mg/dL).   Medical History: Past Medical History:  Diagnosis Date   Anxiety    Arthritis    Depression    GERD (gastroesophageal reflux disease)    Headache    Hypertension    Motion sickness    Palpitations    Plantar fasciitis    Seizures (HCC)    Vertigo     Medications:  Medications Prior to Admission  Medication Sig Dispense Refill Last Dose   Cholecalciferol (VITAMIN D3) 50 MCG (2000 UT) TABS Take 2,000 Units by mouth daily.   11/21/2022   citalopram (CELEXA) 20 MG tablet Take 20 mg by mouth daily as needed (for unresolved depression).   Past Month   citalopram (CELEXA) 40 MG tablet Take 40 mg by mouth daily.   11/21/2022   COLLAGEN PO Take 1 Scoop by mouth daily. Mix and drink as directed   11/21/2022   docusate sodium (COLACE) 100 MG capsule Take 100 mg by mouth in the morning.   11/21/2022 at am   Erenumab-aooe (AIMOVIG) 70 MG/ML SOAJ Inject 70 mg into the skin every 30 (thirty) days. 1.12 mL 11 Past Month   fluticasone (CUTIVATE) 0.05 % cream Apply 1 application  topically daily as needed (for rashes).   unk   hydrOXYzine (ATARAX) 25 MG tablet Take 6.25-12.5 mg by mouth every 6  (six) hours as needed for anxiety (or sleep).   unk   ketotifen (ZADITOR) 0.035 % ophthalmic solution Place 1 drop into both eyes 2 (two) times daily as needed (itching).   unk   Multiple Vitamins-Minerals (BARIATRIC FUSION) CHEW Chew 1 tablet by mouth daily.   11/21/2022 at am   ondansetron (ZOFRAN-ODT) 4 MG disintegrating tablet Take 1 tablet (4 mg total) by mouth every 6 (six) hours as needed for nausea or vomiting. 20 tablet 0 unk   propranolol (INDERAL) 40 MG tablet Take 1 tablet (40 mg total) by mouth 2 (two) times daily. (Patient taking differently: Take 40 mg by mouth in the morning and at bedtime.) 60 tablet 6 11/21/2022 at pm   TUMS ULTRA 1000 1000 MG chewable tablet Chew 1,000 mg by mouth in the morning and at bedtime.   11/21/2022   oxyCODONE (OXY IR/ROXICODONE) 5 MG immediate release tablet Take 1 tablet (5 mg total) by mouth every 6 (six) hours as needed for severe pain. (Patient not taking: Reported on 11/22/2022) 10 tablet 0 Not Taking   pantoprazole (PROTONIX) 40 MG tablet Take 1 tablet (40 mg total) by mouth daily. (Patient not taking: Reported on 11/22/2022) 90 tablet 0 Not Taking  Scheduled:   citalopram  40 mg Oral Daily   feeding supplement  1 Container Oral TID BM   pantoprazole  40 mg Oral Daily   propranolol  40 mg Oral BID     Assessment: 46 yoF with PMH HTN, morbid obesity s/p gastric sleeve resection 12/5, presents 12/22 with abdominal pain x 24 hr associated with nausea; found to have SMV and portal vein thrombosis. No plans for vascular intervention at this time; Pharmacy to dose IV heparin  Baseline INR, aPTT: not done Prior anticoagulation: none  Significant events:  Today, 11/23/2022: Heparin level = 0.37 (therapeutic) with heparin gtt @ 1650 units/hr CBC: Hgb 11.8 (low), Pltc wnl No bleeding or infusion issues per nursing; no interruption in therapy per RN  Goal of Therapy: Heparin level 0.3-0.7 units/ml Monitor platelets by anticoagulation protocol:  Yes  Plan: Continue Heparin IV continuous infusion at 1650 units/hr  Check 6 hour confirmatory heparin level Daily CBC, daily heparin level once stable Monitor for signs of bleeding or thrombosis F/u plans for long-term anticoag (anticipate DOAC)  Thank you for allowing pharmacy to be a part of this patient's care.  Kathryn Compton, PharmD, BCPS Clinical Pharmacist [place] Please utilize Amion for appropriate phone number to reach the unit pharmacist ([place]) 11/23/2022 1:37 PM

## 2022-11-23 NOTE — Progress Notes (Signed)
[place] for IV heparin Indication: SM/portal vein thrombosis  Allergies  Allergen Reactions   Other Itching and Other (See Comments)    Antidepressants-Itching, Gaining Weight Tolerates citalopram     Patient Measurements:   Heparin Dosing Weight: 89 kg (used [place] nomogram for initial dosing)  Vital Signs: Temp: 98.1 F (36.7 C) (12/23 1947) Temp Source: Oral (12/23 1947) BP: 143/72 (12/23 1947) Pulse Rate: 66 (12/23 1947)  Labs: Recent Labs    11/22/22 1143 11/23/22 0246 11/23/22 0732 11/23/22 1204 11/23/22 1926  HGB 12.8 11.8*  --   --   --   HCT 40.3 36.9  --   --   --   PLT 371 272  --   --   --   HEPARINUNFRC  --  0.27*  --  0.37 0.46  CREATININE 0.99  --  0.81  --   --      Estimated Creatinine Clearance: 86.4 mL/min (by C-G formula based on SCr of 0.81 mg/dL).   Medical History: Past Medical History:  Diagnosis Date   Anxiety    Arthritis    Depression    GERD (gastroesophageal reflux disease)    Headache    Hypertension    Motion sickness    Palpitations    Plantar fasciitis    Seizures (HCC)    Vertigo     Medications:  Medications Prior to Admission  Medication Sig Dispense Refill Last Dose   Cholecalciferol (VITAMIN D3) 50 MCG (2000 UT) TABS Take 2,000 Units by mouth daily.   11/21/2022   citalopram (CELEXA) 20 MG tablet Take 20 mg by mouth daily as needed (for unresolved depression).   Past Month   citalopram (CELEXA) 40 MG tablet Take 40 mg by mouth daily.   11/21/2022   COLLAGEN PO Take 1 Scoop by mouth daily. Mix and drink as directed   11/21/2022   docusate sodium (COLACE) 100 MG capsule Take 100 mg by mouth in the morning.   11/21/2022 at am   Erenumab-aooe (AIMOVIG) 70 MG/ML SOAJ Inject 70 mg into the skin every 30 (thirty) days. 1.12 mL 11 Past Month   fluticasone (CUTIVATE) 0.05 % cream Apply 1 application  topically daily as needed (for rashes).   unk   hydrOXYzine (ATARAX) 25 MG  tablet Take 6.25-12.5 mg by mouth every 6 (six) hours as needed for anxiety (or sleep).   unk   ketotifen (ZADITOR) 0.035 % ophthalmic solution Place 1 drop into both eyes 2 (two) times daily as needed (itching).   unk   Multiple Vitamins-Minerals (BARIATRIC FUSION) CHEW Chew 1 tablet by mouth daily.   11/21/2022 at am   ondansetron (ZOFRAN-ODT) 4 MG disintegrating tablet Take 1 tablet (4 mg total) by mouth every 6 (six) hours as needed for nausea or vomiting. 20 tablet 0 unk   propranolol (INDERAL) 40 MG tablet Take 1 tablet (40 mg total) by mouth 2 (two) times daily. (Patient taking differently: Take 40 mg by mouth in the morning and at bedtime.) 60 tablet 6 11/21/2022 at pm   TUMS ULTRA 1000 1000 MG chewable tablet Chew 1,000 mg by mouth in the morning and at bedtime.   11/21/2022   oxyCODONE (OXY IR/ROXICODONE) 5 MG immediate release tablet Take 1 tablet (5 mg total) by mouth every 6 (six) hours as needed for severe pain. (Patient not taking: Reported on 11/22/2022) 10 tablet 0 Not Taking   pantoprazole (PROTONIX) 40 MG tablet Take 1 tablet (40 mg total)  by mouth daily. (Patient not taking: Reported on 11/22/2022) 90 tablet 0 Not Taking   Scheduled:   citalopram  40 mg Oral Daily   feeding supplement  1 Container Oral TID BM   pantoprazole  40 mg Oral Daily   propranolol  40 mg Oral BID     Assessment: 54 yoF with PMH HTN, morbid obesity s/p gastric sleeve resection 12/5, presents 12/22 with abdominal pain x 24 hr associated with nausea; found to have SMV and portal vein thrombosis. No plans for vascular intervention at this time; Pharmacy to dose IV heparin  Baseline INR, aPTT: not done Prior anticoagulation: none  Significant events:  Today, 11/23/2022: Confirmatory Heparin level = 0.46 (therapeutic) with heparin gtt @ 1650 units/hr No bleeding or infusion interruptions documented  Goal of Therapy: Heparin level 0.3-0.7 units/ml Monitor platelets by anticoagulation protocol:  Yes  Plan: Continue Heparin IV continuous infusion at 1650 units/hr  Daily CBC, daily heparin level once stable Monitor for signs of bleeding or thrombosis F/u plans for long-term anticoag   Thank you for allowing pharmacy to be a part of this patient's care.  Peggyann Juba, PharmD, BCPS Please utilize Amion for appropriate phone number to reach the unit pharmacist ([place]) 11/23/2022 8:24 PM

## 2022-11-23 NOTE — Progress Notes (Signed)
[facility]  PROGRESS NOTE  Kathryn Compton YDX:[id number] DOB: 25-Jun-1951 DOA: 11/22/2022 PCP: Carrolyn Meiers, MD   Brief HPI:    71 y.o. female, with history of hypertension, depression came to ED with abdominal pain.  Patient recently had gastric sleeve surgery on December 5 done by Dr. Alvino Blood.  Patient started having significant epigastric abdominal pain since last night.  Patient states that she was having intermittent nausea and abdominal pain since surgery but pain became worse last night.  CT abdomen/pelvis showed thrombosis of SMV and portal vein.  Patient started on heparin.  General surgery was consulted.  No plan for intervention as per vascular surgery.    Subjective   Patient seen and examined, pain has improved.   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 Doren Custard, no need for intervention -Discussed with hematology/oncology, patient will benefit from anticoagulation workup as outpatient -Will need to file for oncology as outpatient  Abdominal pain -Likely postsurgical -As per general surgery patient had lipoma which was resected and also small hiatal hernia which was repaired during surgery on November 05, 2022 -Likely edema from that procedure -Management per general surgery  Depression -Continue Celexa  Hypertension -Continue propranolol     Medications     citalopram  40 mg Oral Daily   feeding supplement  1 Container Oral TID BM   pantoprazole  40 mg Oral Daily   propranolol  40 mg Oral BID     Data Reviewed:   CBG:  No results for input(s): "GLUCAP" in the last 168 hours.  SpO2: 93 %    Vitals:   11/22/22 1838 11/22/22 2303 11/23/22 0254 11/23/22 0607  BP: 113/86 (!) 123/54 (!) 122/56 (!) 132/52  Pulse: 88 69 65 63  Resp: 16  16   Temp: 98 F (36.7 C) 98 F (36.7 C) 97.7 F (36.5 C) (!) 97.3  F (36.3 C)  TempSrc: Oral Oral Oral Oral  SpO2: 92% 93% 91% 93%      Data Reviewed:  Basic Metabolic Panel: Recent Labs  Lab 11/22/22 1143 11/23/22 0732  NA 138 139  K 4.0 3.6  CL 103 107  CO2 26 24  GLUCOSE 105* 88  BUN 18 10  CREATININE 0.99 0.81  CALCIUM 9.1 8.5*    CBC: Recent Labs  Lab 11/22/22 1143 11/23/22 0246  WBC 10.4 9.6  HGB 12.8 11.8*  HCT 40.3 36.9  MCV 85.9 87.6  PLT 371 272    LFT Recent Labs  Lab 11/22/22 1143 11/23/22 0732  AST 21 16  ALT 23 19  ALKPHOS 65 57  BILITOT 0.9 0.8  PROT 8.2* 6.5  ALBUMIN 3.8 3.0*     Antibiotics: Anti-infectives (From admission, onward)    None        DVT prophylaxis: Heparin  Code Status: Full code  Family Communication: No family at bedside   CONSULTS General surgery   Objective    Physical Examination:   Appears in no acute distress Abdomen is soft, distended, no organomegaly, mild diffuse tenderness to palpation Extremities no edema  Status is: Inpatient: SMV/portal vein thrombosis           [place]   [facility] If 7PM-7AM, please contact night-coverage at [email or web address], Office  [phone]   11/23/2022, 9:47 AM  LOS: 0 days

## 2022-11-23 NOTE — Progress Notes (Signed)
Subjective/Chief Complaint: Pain is better, but still there. Not really taking pain meds. Bloating improving. Her main worry is the dysphagia. Even water is difficult to get down.   Objective: Vital signs in last 24 hours: Temp:  [97.3 F (36.3 C)-99.9 F (37.7 C)] 97.3 F (36.3 C) (12/23 0607) Pulse Rate:  [63-88] 63 (12/23 0607) Resp:  [14-18] 16 (12/23 0254) BP: (113-158)/(52-99) 132/52 (12/23 0607) SpO2:  [83 %-99 %] 93 % (12/23 0607) Last BM Date : 11/21/22  Intake/Output from previous day: 12/22 0701 - 12/23 0700 In: 887.8 [I.V.:887.8] Out: -  Intake/Output this shift: No intake/output data recorded.  A&Ox3, no distress Unlabored respirations Abd soft, minimally tender, nondistended. Incisions are all healed.   Lab Results:  Recent Labs    11/22/22 1143 11/23/22 0246  WBC 10.4 9.6  HGB 12.8 11.8*  HCT 40.3 36.9  PLT 371 272   BMET Recent Labs    11/22/22 1143 11/23/22 0732  NA 138 139  K 4.0 3.6  CL 103 107  CO2 26 24  GLUCOSE 105* 88  BUN 18 10  CREATININE 0.99 0.81  CALCIUM 9.1 8.5*   PT/INR No results for input(s): "LABPROT", "INR" in the last 72 hours. ABG No results for input(s): "PHART", "HCO3" in the last 72 hours.  Invalid input(s): "PCO2", "PO2"  Studies/Results: CT Abdomen Pelvis W Contrast  Result Date: 11/22/2022 CLINICAL DATA:  Abdominal pain and nausea since yesterday, history of bariatric surgery 11/05/2022 EXAM: CT ABDOMEN AND PELVIS WITH CONTRAST TECHNIQUE: Multidetector CT imaging of the abdomen and pelvis was performed using the standard protocol following bolus administration of intravenous contrast. RADIATION DOSE REDUCTION: This exam was performed according to the departmental dose-optimization program which includes automated exposure control, adjustment of the mA and/or kV according to patient size and/or use of iterative reconstruction technique. CONTRAST:  173m OMNIPAQUE IOHEXOL 300 MG/ML  SOLN COMPARISON:  None  Available. FINDINGS: Lower chest: No acute pleural or parenchymal lung disease. Hepatobiliary: There is acute appearing thrombosis of the intrahepatic portal venous system, extending to the porta hepatis. There is normal opacification of the portal vein between the splenic confluence of the porta hepatis, as well as normal opacification of the splenic vein. The SMV demonstrates acute thrombosis. Otherwise the liver is unremarkable. The gallbladder is grossly normal. No biliary duct dilation. Pancreas: Unremarkable. No pancreatic ductal dilatation or surrounding inflammatory changes. Spleen: Normal in size without focal abnormality. Adrenals/Urinary Tract: Adrenal glands are unremarkable. Kidneys are normal, without renal calculi, focal solid lesion, or hydronephrosis. Bladder is unremarkable. Stomach/Bowel: No bowel obstruction or ileus. Normal appendix right lower quadrant. Postsurgical changes from gastric sleeve procedure. No bowel wall thickening. Vascular/Lymphatic: There is acute thrombosis of the SMV and its tributaries. The splenic vein and extrahepatic main portal vein are patent. There is diffuse thrombosis of the intrahepatic portal venous system, with relative sparing of the posterior aspect of the right lobe. Atherosclerosis of the abdominal aorta and its branches. No pathologic adenopathy. Reproductive: Uterus and bilateral adnexa are unremarkable. Other: There is mesenteric edema associated with the SMV thrombosis. No free fluid or free intraperitoneal gas. Postsurgical changes within the abdominal wall consistent with recent laparoscopic procedure. No fluid collection or hematoma. No abdominal wall hernia. Musculoskeletal: No acute or destructive bony lesions. Reconstructed images demonstrate no additional findings. IMPRESSION: 1. 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 2. Postsurgical changes from recent gastric sleeve procedure. No bowel  obstruction or ileus.  Critical Value/emergent results were called by telephone at the time of interpretation on 11/22/2022 at 3:22 pm to provider Fredia Sorrow , who verbally acknowledged these results. Electronically Signed   By: Randa Ngo M.D.   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 Per op note "On dissecting the hiatus there was a lipoma along the esophagus concerning for hiatal hernia. THe UGI was negative. The posterior area was dissected out showing a small hiatal hernia. This was repaired with 2-0 Ethibond."  SMV thrombosis- continue IV fluids, therapeutic heparin. Benign abdominal exam. Ok for clears still. Will add breeze protein shakes.   Dysphagia- suspect due to ongoing edema from excision of peri-esophageal lipoma and HH repair. Was patent to dilator and endoscope at time of surgery. If persistent would get UGI prior to discharge.     LOS: 0 days    Kathryn Compton 11/23/2022

## 2022-11-23 NOTE — Progress Notes (Signed)
[facility] for IV heparin Indication: SM/portal vein thrombosis  Allergies  Allergen Reactions   Other Itching and Other (See Comments)    Antidepressants-Itching, Gaining Weight Tolerates citalopram     Patient Measurements:   Heparin Dosing Weight: 89 kg (used [place] nomogram for initial dosing)  Vital Signs: Temp: 97.7 F (36.5 C) (12/23 0254) Temp Source: Oral (12/23 0254) BP: 122/56 (12/23 0254) Pulse Rate: 65 (12/23 0254)  Labs: Recent Labs    11/22/22 1143 11/23/22 0246  HGB 12.8 11.8*  HCT 40.3 36.9  PLT 371 272  HEPARINUNFRC  --  0.27*  CREATININE 0.99  --      Estimated Creatinine Clearance: 70.7 mL/min (by C-G formula based on SCr of 0.99 mg/dL).   Medical History: Past Medical History:  Diagnosis Date   Anxiety    Arthritis    Depression    GERD (gastroesophageal reflux disease)    Headache    Hypertension    Motion sickness    Palpitations    Plantar fasciitis    Seizures (HCC)    Vertigo     Medications:  Medications Prior to Admission  Medication Sig Dispense Refill Last Dose   Cholecalciferol (VITAMIN D3) 50 MCG (2000 UT) TABS Take 2,000 Units by mouth daily.   11/21/2022   citalopram (CELEXA) 20 MG tablet Take 20 mg by mouth daily as needed (for unresolved depression).   Past Month   citalopram (CELEXA) 40 MG tablet Take 40 mg by mouth daily.   11/21/2022   COLLAGEN PO Take 1 Scoop by mouth daily. Mix and drink as directed   11/21/2022   docusate sodium (COLACE) 100 MG capsule Take 100 mg by mouth in the morning.   11/21/2022 at am   Erenumab-aooe (AIMOVIG) 70 MG/ML SOAJ Inject 70 mg into the skin every 30 (thirty) days. 1.12 mL 11 Past Month   fluticasone (CUTIVATE) 0.05 % cream Apply 1 application  topically daily as needed (for rashes).   unk   hydrOXYzine (ATARAX) 25 MG tablet Take 6.25-12.5 mg by mouth every 6 (six) hours as needed for anxiety (or sleep).   unk   ketotifen (ZADITOR) 0.035 %  ophthalmic solution Place 1 drop into both eyes 2 (two) times daily as needed (itching).   unk   Multiple Vitamins-Minerals (BARIATRIC FUSION) CHEW Chew 1 tablet by mouth daily.   11/21/2022 at am   ondansetron (ZOFRAN-ODT) 4 MG disintegrating tablet Take 1 tablet (4 mg total) by mouth every 6 (six) hours as needed for nausea or vomiting. 20 tablet 0 unk   propranolol (INDERAL) 40 MG tablet Take 1 tablet (40 mg total) by mouth 2 (two) times daily. (Patient taking differently: Take 40 mg by mouth in the morning and at bedtime.) 60 tablet 6 11/21/2022 at pm   TUMS ULTRA 1000 1000 MG chewable tablet Chew 1,000 mg by mouth in the morning and at bedtime.   11/21/2022   oxyCODONE (OXY IR/ROXICODONE) 5 MG immediate release tablet Take 1 tablet (5 mg total) by mouth every 6 (six) hours as needed for severe pain. (Patient not taking: Reported on 11/22/2022) 10 tablet 0 Not Taking   pantoprazole (PROTONIX) 40 MG tablet Take 1 tablet (40 mg total) by mouth daily. 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 No plans for vascular intervention at this time; Pharmacy to dose IV heparin  Baseline INR, aPTT: not done Prior anticoagulation: none  Significant events:  Today, 11/23/2022: Heparin level = 0.27 (subtherapeutic) with heparin gtt @ 1500 units/hr CBC: Hgb 11.8 (low), Pltc wnl No bleeding or infusion issues per nursing; no interruption in therapy per RN  Goal of Therapy: Heparin level 0.3-0.7 units/ml Monitor platelets by anticoagulation protocol: Yes  Plan: Increase Heparin gtt to 1650 units/hr  Check heparin level 8 hrs after heparin rate increase Daily CBC,  daily heparin level once stable Monitor for signs of bleeding or thrombosis F/u plans for long-term anticoag (anticipate DOAC)  Leone Haven, PharmD 11/23/2022, 3:56 AM

## 2022-11-24 DIAGNOSIS — K55069 Acute infarction of intestine, part and extent unspecified: Secondary | ICD-10-CM | POA: Diagnosis not present

## 2022-11-24 DIAGNOSIS — R1013 Epigastric pain: Secondary | ICD-10-CM | POA: Diagnosis not present

## 2022-11-24 DIAGNOSIS — I81 Portal vein thrombosis: Secondary | ICD-10-CM | POA: Diagnosis not present

## 2022-11-24 LAB — BASIC METABOLIC PANEL
Anion gap: 4 — ABNORMAL LOW (ref 5–15)
BUN: 6 mg/dL — ABNORMAL LOW (ref 8–23)
CO2: 26 mmol/L (ref 22–32)
Calcium: 8.1 mg/dL — ABNORMAL LOW (ref 8.9–10.3)
Chloride: 107 mmol/L (ref 98–111)
Creatinine, Ser: 0.76 mg/dL (ref 0.44–1.00)
GFR, Estimated: >60 mL/min (ref >60)
Glucose, Bld: 84 mg/dL (ref 70–99)
Potassium: 3.5 mmol/L (ref 3.5–5.1)
Sodium: 137 mmol/L (ref 135–145)

## 2022-11-24 LAB — CBC
HCT: 35.1 % — ABNORMAL LOW (ref 36.0–46.0)
Hemoglobin: 10.9 g/dL — ABNORMAL LOW (ref 12.0–15.0)
MCH: 27.8 pg (ref 26.0–34.0)
MCHC: 31.1 g/dL (ref 30.0–36.0)
MCV: 89.5 fL (ref 80.0–100.0)
Platelets: 304 10*3/uL (ref 150–400)
RBC: 3.92 MIL/uL (ref 3.87–5.11)
RDW: 15.1 % (ref 11.5–15.5)
WBC: 7.9 10*3/uL (ref 4.0–10.5)
nRBC: 0 % (ref 0.0–0.2)

## 2022-11-24 LAB — HEPARIN LEVEL (UNFRACTIONATED): Heparin Unfractionated: 0.31 IU/mL (ref 0.30–0.70)

## 2022-11-24 LAB — MAGNESIUM: Magnesium: 2.2 mg/dL (ref 1.7–2.4)

## 2022-11-24 MED ORDER — BISACODYL 10 MG RE SUPP
10.0000 mg | Freq: Once | RECTAL | Status: AC
  Administered 2022-11-24: 10 mg via RECTAL
  Filled 2022-11-24: qty 1

## 2022-11-24 MED ORDER — PANTOPRAZOLE SODIUM 40 MG IV SOLR
40.0000 mg | Freq: Two times a day (BID) | INTRAVENOUS | Status: DC
  Administered 2022-11-24 – 2022-11-26 (×5): 40 mg via INTRAVENOUS
  Filled 2022-11-24 (×5): qty 10

## 2022-11-24 MED ORDER — SODIUM CHLORIDE 0.9 % IV SOLN: INTRAVENOUS | Status: DC | PRN

## 2022-11-24 MED ORDER — OXYCODONE HCL 5 MG PO TABS
5.0000 mg | ORAL_TABLET | Freq: Once | ORAL | Status: AC | PRN
  Administered 2022-11-24: 5 mg via ORAL
  Filled 2022-11-24: qty 1

## 2022-11-24 MED ORDER — TRAZODONE HCL 50 MG PO TABS
50.0000 mg | ORAL_TABLET | Freq: Every evening | ORAL | Status: DC | PRN
  Administered 2022-11-24: 50 mg via ORAL
  Filled 2022-11-24: qty 1

## 2022-11-24 MED ORDER — FENTANYL CITRATE PF 50 MCG/ML IJ SOSY
25.0000 ug | PREFILLED_SYRINGE | INTRAMUSCULAR | Status: DC | PRN
  Administered 2022-11-24 (×2): 25 ug via INTRAVENOUS
  Filled 2022-11-24 (×2): qty 1

## 2022-11-24 MED ORDER — DOCUSATE SODIUM 100 MG PO CAPS
100.0000 mg | ORAL_CAPSULE | Freq: Two times a day (BID) | ORAL | Status: DC
  Administered 2022-11-24 – 2022-11-26 (×5): 100 mg via ORAL
  Filled 2022-11-24 (×5): qty 1

## 2022-11-24 NOTE — Progress Notes (Signed)
Patient ambulated the length of the hall with her husband. She tolerated it well.

## 2022-11-24 NOTE — Progress Notes (Signed)
ANTICOAGULATION CONSULT NOTE  Pharmacy Consult for IV heparin Indication: SM/portal vein thrombosis  Allergies  Allergen Reactions   Other Itching and Other (See Comments)    Antidepressants-Itching, Gaining Weight Tolerates citalopram     Patient Measurements:   Heparin Dosing Weight: 89 kg (used [place] nomogram for initial dosing)  Vital Signs: Temp: 98.4 F (36.9 C) (12/24 0416) Temp Source: Oral (12/24 0416) BP: 134/59 (12/24 0416) Pulse Rate: 65 (12/24 0416)  Labs: Recent Labs    11/22/22 1143 11/22/22 1143 11/23/22 0246 11/23/22 0732 11/23/22 1204 11/23/22 1926 11/24/22 0634  HGB 12.8  --  11.8*  --   --   --  10.9*  HCT 40.3  --  36.9  --   --   --  35.1*  PLT 371  --  272  --   --   --  304  HEPARINUNFRC  --    < > 0.27*  --  0.37 0.46 0.31  CREATININE 0.99  --   --  0.81  --   --  0.76   < > = values in this interval not displayed.     Estimated Creatinine Clearance: 87.5 mL/min (by C-G formula based on SCr of 0.76 mg/dL).   Medical History: Past Medical History:  Diagnosis Date   Anxiety    Arthritis    Depression    GERD (gastroesophageal reflux disease)    Headache    Hypertension    Motion sickness    Palpitations    Plantar fasciitis    Seizures (HCC)    Vertigo     Medications:  Medications Prior to Admission  Medication Sig Dispense Refill Last Dose   Cholecalciferol (VITAMIN D3) 50 MCG (2000 UT) TABS Take 2,000 Units by mouth daily.   11/21/2022   citalopram (CELEXA) 20 MG tablet Take 20 mg by mouth daily as needed (for unresolved depression).   Past Month   citalopram (CELEXA) 40 MG tablet Take 40 mg by mouth daily.   11/21/2022   COLLAGEN PO Take 1 Scoop by mouth daily. Mix and drink as directed   11/21/2022   docusate sodium (COLACE) 100 MG capsule Take 100 mg by mouth in the morning.   11/21/2022 at am   Erenumab-aooe (AIMOVIG) 70 MG/ML SOAJ Inject 70 mg into the skin every 30 (thirty) days. 1.12 mL 11 Past Month    fluticasone (CUTIVATE) 0.05 % cream Apply 1 application  topically daily as needed (for rashes).   unk   hydrOXYzine (ATARAX) 25 MG tablet Take 6.25-12.5 mg by mouth every 6 (six) hours as needed for anxiety (or sleep).   unk   ketotifen (ZADITOR) 0.035 % ophthalmic solution Place 1 drop into both eyes 2 (two) times daily as needed (itching).   unk   Multiple Vitamins-Minerals (BARIATRIC FUSION) CHEW Chew 1 tablet by mouth daily.   11/21/2022 at am   ondansetron (ZOFRAN-ODT) 4 MG disintegrating tablet Take 1 tablet (4 mg total) by mouth every 6 (six) hours as needed for nausea or vomiting. 20 tablet 0 unk   propranolol (INDERAL) 40 MG tablet Take 1 tablet (40 mg total) by mouth 2 (two) times daily. (Patient taking differently: Take 40 mg by mouth in the morning and at bedtime.) 60 tablet 6 11/21/2022 at pm   TUMS ULTRA 1000 1000 MG chewable tablet Chew 1,000 mg by mouth in the morning and at bedtime.   11/21/2022   oxyCODONE (OXY IR/ROXICODONE) 5 MG immediate release tablet Take 1 tablet (5 mg  total) by mouth every 6 (six) hours as needed for severe pain. (Patient not taking: Reported on 11/22/2022) 10 tablet 0 Not Taking   pantoprazole (PROTONIX) 40 MG tablet Take 1 tablet (40 mg total) by mouth daily. 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 No plans for vascular intervention at this time; Pharmacy to dose IV heparin.  Significant events:  Today, 11/24/2022: Heparin level 0.31 (therapeutic) on infusion at 1650 units/hr No bleeding  or infusion interruptions reported H/H slightly low but relatively stable.  Pltc WNL.  Goal of Therapy: Heparin level 0.3-0.7 units/ml Monitor platelets by anticoagulation protocol: Yes  Plan: Continue Heparin IV continuous infusion at 1650 units/hr  Daily CBC, daily heparin level Monitor for signs of bleeding or thrombosis F/u plans for long-term anticoag   Thank you for allowing pharmacy to be a part of this patient's care.  Clayburn Pert, PharmD, BCPS 11/24/2022  8:49 AM Please utilize Amion for appropriate phone number to reach the unit pharmacist ([place])

## 2022-11-24 NOTE — Progress Notes (Signed)
  Transition of Care [facility]) Screening Note   Patient Details  Name: Kathryn Compton Date of Birth: 04/20/1951   Transition of Care [facility]) CM/SW Contact:    Lennart Pall, LCSW Phone Number: 11/24/2022, 9:55 AM    Transition of Care Department [facility]) has reviewed patient and no TOC needs have been identified at this time. We will continue to monitor patient advancement through interdisciplinary progression rounds. If new patient transition needs arise, please place a TOC consult.

## 2022-11-24 NOTE — Progress Notes (Signed)
[facility]  PROGRESS NOTE  Kathryn Compton ZOX:[id number] DOB: 05-10-1951 DOA: 11/22/2022 PCP: Carrolyn Meiers, MD   Brief HPI:    71 y.o. female, with history of hypertension, depression came to ED with abdominal pain.  Patient recently had gastric sleeve surgery on December 5 done by Dr. Alvino Blood.  Patient started having significant epigastric abdominal pain since last night.  Patient states that she was having intermittent nausea and abdominal pain since surgery but pain became worse last night.  CT abdomen/pelvis showed thrombosis of SMV and portal vein.  Patient started on heparin.  General surgery was consulted.  No plan for intervention as per vascular surgery.    Subjective   Patient seen and examined, still has mild epigastric pain.  Says the pain is worse on laying down otherwise pain is better when she sits up in the chair.   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 Doren Custard, no need for intervention -Discussed with hematology/oncology, patient will benefit from anticoagulation workup as outpatient -Will need to follow with  oncology as outpatient  Abdominal pain -Likely postsurgical -As per general surgery patient had lipoma which was resected and also small hiatal hernia which was repaired during surgery on November 05, 2022 -Likely edema from that procedure -Management per general surgery  Depression -Continue Celexa  Hypertension -Continue propranolol     Medications     citalopram  40 mg Oral Daily   docusate sodium  100 mg Oral BID   feeding supplement  1 Container Oral TID BM   pantoprazole (PROTONIX) IV  40 mg Intravenous Q12H   propranolol  40 mg Oral BID     Data Reviewed:   CBG:  No results for input(s): "GLUCAP" in the last 168 hours.  SpO2: 95 %    Vitals:   11/23/22 1426 11/23/22 1947 11/24/22  0416 11/24/22 0958  BP: 132/61 (!) 143/72 (!) 134/59 (!) 129/58  Pulse: 65 66 65 70  Resp: '18 18 18   '$ Temp: 98.1 F (36.7 C) 98.1 F (36.7 C) 98.4 F (36.9 C)   TempSrc: Oral Oral Oral   SpO2: 93% 96% 95%       Data Reviewed:  Basic Metabolic Panel: Recent Labs  Lab 11/22/22 1143 11/23/22 0732 11/24/22 0634  NA 138 139 137  K 4.0 3.6 3.5  CL 103 107 107  CO2 '26 24 26  '$ GLUCOSE 105* 88 84  BUN 18 10 6*  CREATININE 0.99 0.81 0.76  CALCIUM 9.1 8.5* 8.1*  MG  --   --  2.2    CBC: Recent Labs  Lab 11/22/22 1143 11/23/22 0246 11/24/22 0634  WBC 10.4 9.6 7.9  HGB 12.8 11.8* 10.9*  HCT 40.3 36.9 35.1*  MCV 85.9 87.6 89.5  PLT 371 272 304    LFT Recent Labs  Lab 11/22/22 1143 11/23/22 0732  AST 21 16  ALT 23 19  ALKPHOS 65 57  BILITOT 0.9 0.8  PROT 8.2* 6.5  ALBUMIN 3.8 3.0*     Antibiotics: Anti-infectives (From admission, onward)    None        DVT prophylaxis: Heparin  Code Status: Full code  Family Communication: No family at bedside   CONSULTS General surgery   Objective    Physical Examination:  Appears in no acute distress S1-S2, regular, no murmur auscultated Clear to auscultation bilaterally Abdomen is soft, mild tenderness  in epigastric region No edema in the lower extremities   Status is: Inpatient: SMV/portal vein thrombosis           [facility] If 7PM-7AM, please contact night-coverage at [email or web address], Office  [phone]   11/24/2022, 10:07 AM  LOS: 1 day

## 2022-11-24 NOTE — Progress Notes (Signed)
Subjective/Chief Complaint: She is still having some diffuse abdominal pain this morning, and feels bloated.  Her last bowel movement was Wednesday.  Doing okay with very slow sipping on getting some clear liquids and protein down.  Still with the pain until the bolus passes.   Objective: Vital signs in last 24 hours: Temp:  [98.1 F (36.7 C)-98.4 F (36.9 C)] 98.4 F (36.9 C) (12/24 0416) Pulse Rate:  [65-66] 65 (12/24 0416) Resp:  [18] 18 (12/24 0416) BP: (132-143)/(59-72) 134/59 (12/24 0416) SpO2:  [93 %-96 %] 95 % (12/24 0416) Last BM Date : 11/21/22  Intake/Output from previous day: 12/23 0701 - 12/24 0700 In: 1187.3 [I.V.:1187.3] Out: -  Intake/Output this shift: Total I/O In: 382 [I.V.:382] Out: -   A&Ox3, no distress Unlabored respirations Abd soft, slightly more distended today than yesterday, remains mildly diffusely tender.  Incisions are well-healed.  Lab Results:  Recent Labs    11/22/22 1143 11/23/22 0246  WBC 10.4 9.6  HGB 12.8 11.8*  HCT 40.3 36.9  PLT 371 272   BMET Recent Labs    11/22/22 1143 11/23/22 0732  NA 138 139  K 4.0 3.6  CL 103 107  CO2 26 24  GLUCOSE 105* 88  BUN 18 10  CREATININE 0.99 0.81  CALCIUM 9.1 8.5*   PT/INR No results for input(s): "LABPROT", "INR" in the last 72 hours. ABG No results for input(s): "PHART", "HCO3" in the last 72 hours.  Invalid input(s): "PCO2", "PO2"  Studies/Results: CT Abdomen Pelvis W Contrast  Result Date: 11/22/2022 CLINICAL DATA:  Abdominal pain and nausea since yesterday, history of bariatric surgery 11/05/2022 EXAM: CT ABDOMEN AND PELVIS WITH CONTRAST TECHNIQUE: Multidetector CT imaging of the abdomen and pelvis was performed using the standard protocol following bolus administration of intravenous contrast. RADIATION DOSE REDUCTION: This exam was performed according to the departmental dose-optimization program which includes automated exposure control, adjustment of the mA and/or  kV according to patient size and/or use of iterative reconstruction technique. CONTRAST:  16m OMNIPAQUE IOHEXOL 300 MG/ML  SOLN COMPARISON:  None Available. FINDINGS: Lower chest: No acute pleural or parenchymal lung disease. Hepatobiliary: There is acute appearing thrombosis of the intrahepatic portal venous system, extending to the porta hepatis. There is normal opacification of the portal vein between the splenic confluence of the porta hepatis, as well as normal opacification of the splenic vein. The SMV demonstrates acute thrombosis. Otherwise the liver is unremarkable. The gallbladder is grossly normal. No biliary duct dilation. Pancreas: Unremarkable. No pancreatic ductal dilatation or surrounding inflammatory changes. Spleen: Normal in size without focal abnormality. Adrenals/Urinary Tract: Adrenal glands are unremarkable. Kidneys are normal, without renal calculi, focal solid lesion, or hydronephrosis. Bladder is unremarkable. Stomach/Bowel: No bowel obstruction or ileus. Normal appendix right lower quadrant. Postsurgical changes from gastric sleeve procedure. No bowel wall thickening. Vascular/Lymphatic: There is acute thrombosis of the SMV and its tributaries. The splenic vein and extrahepatic main portal vein are patent. There is diffuse thrombosis of the intrahepatic portal venous system, with relative sparing of the posterior aspect of the right lobe. Atherosclerosis of the abdominal aorta and its branches. No pathologic adenopathy. Reproductive: Uterus and bilateral adnexa are unremarkable. Other: There is mesenteric edema associated with the SMV thrombosis. No free fluid or free intraperitoneal gas. Postsurgical changes within the abdominal wall consistent with recent laparoscopic procedure. No fluid collection or hematoma. No abdominal wall hernia. Musculoskeletal: No acute or destructive bony lesions. Reconstructed images demonstrate no additional findings. IMPRESSION: 1. Acute thrombosis  of the  SMV the and the intrahepatic branches of the portal vein as above. 2. Postsurgical changes from recent gastric sleeve procedure. No bowel obstruction or ileus. Critical Value/emergent results were called by telephone at the time of interpretation on 11/22/2022 at 3:22 pm to provider Fredia Sorrow , who verbally acknowledged these results. Electronically Signed   By: Randa Ngo M.D.   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 Per op note "On dissecting the hiatus there was a lipoma along the esophagus concerning for hiatal hernia. THe UGI was negative. The posterior area was dissected out showing a small hiatal hernia. This was repaired with 2-0 Ethibond."  SMV thrombosis- continue IV fluids, therapeutic heparin. Benign abdominal exam. Ok for clears still. Will add breeze protein shakes.  Vital signs reassuring, still having some abdominal pain, labs are pending this morning.  Dysphagia- suspect due to ongoing edema from excision of peri-esophageal lipoma and HH repair. Was patent to dilator and endoscope at time of surgery.  She is doing okay with liquids, but still unlikely to meet her p.o. intake goals at home.  If persistent would get UGI prior to discharge.   Constipation-we will try a suppository today.    LOS: 1 day    Kathryn Compton 11/24/2022

## 2022-11-25 DIAGNOSIS — K55069 Acute infarction of intestine, part and extent unspecified: Secondary | ICD-10-CM | POA: Diagnosis not present

## 2022-11-25 DIAGNOSIS — I81 Portal vein thrombosis: Secondary | ICD-10-CM | POA: Diagnosis not present

## 2022-11-25 DIAGNOSIS — R1013 Epigastric pain: Secondary | ICD-10-CM | POA: Diagnosis not present

## 2022-11-25 LAB — CBC
HCT: 32.8 % — ABNORMAL LOW (ref 36.0–46.0)
Hemoglobin: 10.3 g/dL — ABNORMAL LOW (ref 12.0–15.0)
MCH: 27 pg (ref 26.0–34.0)
MCHC: 31.4 g/dL (ref 30.0–36.0)
MCV: 86.1 fL (ref 80.0–100.0)
Platelets: 283 10*3/uL (ref 150–400)
RBC: 3.81 MIL/uL — ABNORMAL LOW (ref 3.87–5.11)
RDW: 15 % (ref 11.5–15.5)
WBC: 7.1 10*3/uL (ref 4.0–10.5)
nRBC: 0 % (ref 0.0–0.2)

## 2022-11-25 LAB — HEPARIN LEVEL (UNFRACTIONATED): Heparin Unfractionated: 0.34 IU/mL (ref 0.30–0.70)

## 2022-11-25 MED ORDER — OXYCODONE HCL 5 MG PO TABS
5.0000 mg | ORAL_TABLET | Freq: Once | ORAL | Status: AC | PRN
  Administered 2022-11-25: 5 mg via ORAL
  Filled 2022-11-25: qty 1

## 2022-11-25 MED ORDER — POLYETHYLENE GLYCOL 3350 17 G PO PACK
17.0000 g | PACK | Freq: Two times a day (BID) | ORAL | Status: DC
  Administered 2022-11-25 (×2): 17 g via ORAL
  Filled 2022-11-25 (×3): qty 1

## 2022-11-25 MED ORDER — TRAMADOL HCL 50 MG PO TABS
50.0000 mg | ORAL_TABLET | Freq: Four times a day (QID) | ORAL | Status: DC | PRN
  Administered 2022-11-25: 50 mg via ORAL
  Filled 2022-11-25: qty 1

## 2022-11-25 MED ORDER — OXYCODONE HCL 5 MG PO TABS
10.0000 mg | ORAL_TABLET | ORAL | Status: AC | PRN
  Administered 2022-11-25: 10 mg via ORAL
  Filled 2022-11-25 (×2): qty 2

## 2022-11-25 MED ORDER — POTASSIUM CHLORIDE 20 MEQ PO PACK
40.0000 meq | PACK | Freq: Once | ORAL | Status: AC
  Administered 2022-11-25: 40 meq via ORAL
  Filled 2022-11-25: qty 2

## 2022-11-25 MED ORDER — SORBITOL 70 % SOLN: 960.0000 mL | TOPICAL_OIL | Freq: Once | ORAL | Status: DC | PRN

## 2022-11-25 NOTE — Progress Notes (Signed)
   Subjective/Chief Complaint: Reports her abdominal pain is unchanged, had a rough night.  Her last bowel movement was Wednesday, no result after suppository yesterday.  Liquids are going down a bit easier, but pills still difficult.   Objective: Vital signs in last 24 hours: Temp:  [98.3 F (36.8 C)-98.7 F (37.1 C)] 98.3 F (36.8 C) (12/25 0517) Pulse Rate:  [69-71] 71 (12/25 0517) Resp:  [16-18] 18 (12/25 0517) BP: (127-140)/(55-65) 127/55 (12/25 0517) SpO2:  [90 %-94 %] 90 % (12/25 0517) Last BM Date : 11/21/22 (suppository given by self this am)  Intake/Output from previous day: 12/24 0701 - 12/25 0700 In: 398.4 [I.V.:398.4] Out: -  Intake/Output this shift: No intake/output data recorded.  A&Ox3, no distress Unlabored respirations Abd soft, distention improved, remains mildly diffusely tender moreso in upper abdomen.  Incisions are well-healed.  Lab Results:  Recent Labs    11/24/22 0634 11/25/22 0749  WBC 7.9 7.1  HGB 10.9* 10.3*  HCT 35.1* 32.8*  PLT 304 283    BMET Recent Labs    11/23/22 0732 11/24/22 0634  NA 139 137  K 3.6 3.5  CL 107 107  CO2 24 26  GLUCOSE 88 84  BUN 10 6*  CREATININE 0.81 0.76  CALCIUM 8.5* 8.1*    PT/INR No results for input(s): "LABPROT", "INR" in the last 72 hours. ABG No results for input(s): "PHART", "HCO3" in the last 72 hours.  Invalid input(s): "PCO2", "PO2"  Studies/Results: No results found.  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 Per op note "On dissecting the hiatus there was a lipoma along the esophagus concerning for hiatal hernia. THe UGI was negative. The posterior area was dissected out showing a small hiatal hernia. This was repaired with 2-0 Ethibond."  SMV thrombosis- continue IV fluids, therapeutic heparin. Benign abdominal exam but still with diffuse pain, some of which may be related to constipation. Will  try bariatric fulls. Will add breeze protein shakes.  Vital signs reassuring, no leukocytosis.  Dysphagia- suspect due to ongoing edema from excision of peri-esophageal lipoma and HH repair. Was patent to dilator and endoscope at time of surgery.  She is doing a bit better with liquids, but still unlikely to meet her p.o. intake goals at home.  If persistent would get UGI prior to discharge.   Constipation-no result with suppository. Add scheduled miralax, enema PRN.    LOS: 2 days    Kathryn Compton 11/25/2022

## 2022-11-25 NOTE — Progress Notes (Signed)
[facility]  PROGRESS NOTE  Arley Garant IWO:[id number] DOB: 1951-07-31 DOA: 11/22/2022 PCP: Carrolyn Meiers, MD   Brief HPI:    71 y.o. female, with history of hypertension, depression came to ED with abdominal pain.  Patient recently had gastric sleeve surgery on December 5 done by Dr. Alvino Blood.  Patient started having significant epigastric abdominal pain since last night.  Patient states that she was having intermittent nausea and abdominal pain since surgery but pain became worse last night.  CT abdomen/pelvis showed thrombosis of SMV and portal vein.  Patient started on heparin.  General surgery was consulted.  No plan for intervention as per vascular surgery.    Subjective   Patient seen and examined, still has epigastric pain.  Also complains of constipation.   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 Doren Custard, no need for intervention -Discussed with hematology/oncology, patient will benefit from anticoagulation workup as outpatient -Will need to follow with hematology/oncology as outpatient  Abdominal pain -Likely postsurgical -As per general surgery patient had lipoma which was resected and also small hiatal hernia which was repaired during surgery on November 05, 2022 -Likely edema from that procedure -Management per general surgery  Depression -Continue Celexa  Hypertension -Continue propranolol  Constipation -Started on MiraLAX -Smog enema x 1    Medications     citalopram  40 mg Oral Daily   docusate sodium  100 mg Oral BID   feeding supplement  1 Container Oral TID BM   pantoprazole (PROTONIX) IV  40 mg Intravenous Q12H   polyethylene glycol  17 g Oral BID   propranolol  40 mg Oral BID     Data Reviewed:   CBG:  No results for input(s): "GLUCAP" in the last 168 hours.  SpO2: 90 %    Vitals:    11/24/22 1932 11/24/22 2104 11/25/22 0419 11/25/22 0517  BP: (!) 140/65   (!) 127/55  Pulse: 69   71  Resp: '18 16 16 18  '$ Temp: 98.7 F (37.1 C)   98.3 F (36.8 C)  TempSrc: Oral   Oral  SpO2: 94%   90%      Data Reviewed:  Basic Metabolic Panel: Recent Labs  Lab 11/22/22 1143 11/23/22 0732 11/24/22 0634  NA 138 139 137  K 4.0 3.6 3.5  CL 103 107 107  CO2 '26 24 26  '$ GLUCOSE 105* 88 84  BUN 18 10 6*  CREATININE 0.99 0.81 0.76  CALCIUM 9.1 8.5* 8.1*  MG  --   --  2.2    CBC: Recent Labs  Lab 11/22/22 1143 11/23/22 0246 11/24/22 0634 11/25/22 0749  WBC 10.4 9.6 7.9 7.1  HGB 12.8 11.8* 10.9* 10.3*  HCT 40.3 36.9 35.1* 32.8*  MCV 85.9 87.6 89.5 86.1  PLT 371 272 304 283    LFT Recent Labs  Lab 11/22/22 1143 11/23/22 0732  AST 21 16  ALT 23 19  ALKPHOS 65 57  BILITOT 0.9 0.8  PROT 8.2* 6.5  ALBUMIN 3.8 3.0*     Antibiotics: Anti-infectives (From admission, onward)    None        DVT prophylaxis: Heparin  Code Status: Full code  Family Communication: No family at bedside   CONSULTS General surgery   Objective    Physical Examination:  Appears in no acute distress Mild epigastric tenderness to palpation  No edema in the lower  extremities   Status is: Inpatient: SMV/portal vein thrombosis           [place]   [facility] If 7PM-7AM, please contact night-coverage at [email or web address], Office  [phone]   11/25/2022, 9:14 AM  LOS: 2 days

## 2022-11-25 NOTE — Progress Notes (Signed)
ANTICOAGULATION CONSULT NOTE  Pharmacy Consult for IV heparin Indication: SM/portal vein thrombosis  Allergies  Allergen Reactions   Other Itching and Other (See Comments)    Antidepressants-Itching, Gaining Weight Tolerates citalopram     Patient Measurements:   Heparin Dosing Weight: 89 kg (used [place] nomogram for initial dosing)  Vital Signs: Temp: 98.3 F (36.8 C) (12/25 0517) Temp Source: Oral (12/25 0517) BP: 127/55 (12/25 0517) Pulse Rate: 71 (12/25 0517)  Labs: Recent Labs    11/22/22 1143 11/23/22 0246 11/23/22 0732 11/23/22 1204 11/23/22 1926 11/24/22 0634 11/25/22 0749  HGB 12.8 11.8*  --   --   --  10.9* 10.3*  HCT 40.3 36.9  --   --   --  35.1* 32.8*  PLT 371 272  --   --   --  304 283  HEPARINUNFRC  --  0.27*  --    < > 0.46 0.31 0.34  CREATININE 0.99  --  0.81  --   --  0.76  --    < > = values in this interval not displayed.     Estimated Creatinine Clearance: 87.5 mL/min (by C-G formula based on SCr of 0.76 mg/dL).  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 No plans for vascular intervention at this time; Pharmacy to dose IV heparin.  Significant events:  Today, 11/25/2022: Heparin level 0.34 (therapeutic) on infusion at 1650 units/hr No bleeding or infusion interruptions reported H/H slightly low stable.  Pltc WNL.  Goal of Therapy: Heparin level 0.3-0.7 units/ml Monitor platelets by anticoagulation protocol: Yes  Plan: Continue Heparin IV continuous infusion at 1650 units/hr  Daily CBC, daily heparin level Monitor for signs of bleeding or thrombosis F/u plans for long-term anticoag   Thank you for allowing pharmacy to be a part of this patient's care.  Eudelia Bunch, Pharm.D Use secure chat for questions 11/25/2022 9:06 AM

## 2022-11-26 ENCOUNTER — Telehealth: Payer: Self-pay | Admitting: Dietician

## 2022-11-26 ENCOUNTER — Other Ambulatory Visit (HOSPITAL_COMMUNITY): Payer: Self-pay

## 2022-11-26 DIAGNOSIS — I81 Portal vein thrombosis: Secondary | ICD-10-CM | POA: Diagnosis not present

## 2022-11-26 LAB — CBC
HCT: 31.8 % — ABNORMAL LOW (ref 36.0–46.0)
Hemoglobin: 10.1 g/dL — ABNORMAL LOW (ref 12.0–15.0)
MCH: 27.4 pg (ref 26.0–34.0)
MCHC: 31.8 g/dL (ref 30.0–36.0)
MCV: 86.4 fL (ref 80.0–100.0)
Platelets: 297 10*3/uL (ref 150–400)
RBC: 3.68 MIL/uL — ABNORMAL LOW (ref 3.87–5.11)
RDW: 14.9 % (ref 11.5–15.5)
WBC: 6.6 10*3/uL (ref 4.0–10.5)
nRBC: 0 % (ref 0.0–0.2)

## 2022-11-26 LAB — COMPREHENSIVE METABOLIC PANEL
ALT: 23 U/L (ref 0–44)
AST: 23 U/L (ref 15–41)
Albumin: 2.8 g/dL — ABNORMAL LOW (ref 3.5–5.0)
Alkaline Phosphatase: 60 U/L (ref 38–126)
Anion gap: 7 (ref 5–15)
BUN: <5 mg/dL — ABNORMAL LOW (ref 8–23)
CO2: 27 mmol/L (ref 22–32)
Calcium: 8.9 mg/dL (ref 8.9–10.3)
Chloride: 107 mmol/L (ref 98–111)
Creatinine, Ser: 0.75 mg/dL (ref 0.44–1.00)
GFR, Estimated: >60 mL/min (ref >60)
Glucose, Bld: 89 mg/dL (ref 70–99)
Potassium: 3.5 mmol/L (ref 3.5–5.1)
Sodium: 141 mmol/L (ref 135–145)
Total Bilirubin: 0.8 mg/dL (ref 0.3–1.2)
Total Protein: 6.2 g/dL — ABNORMAL LOW (ref 6.5–8.1)

## 2022-11-26 LAB — HEPARIN LEVEL (UNFRACTIONATED): Heparin Unfractionated: 0.3 IU/mL (ref 0.30–0.70)

## 2022-11-26 MED ORDER — ENOXAPARIN SODIUM 120 MG/0.8ML IJ SOSY
120.0000 mg | PREFILLED_SYRINGE | Freq: Two times a day (BID) | INTRAMUSCULAR | Status: DC
  Administered 2022-11-26: 120 mg via SUBCUTANEOUS
  Filled 2022-11-26 (×2): qty 0.8

## 2022-11-26 MED ORDER — ENOXAPARIN SODIUM 120 MG/0.8ML IJ SOSY: 120.0000 mg | PREFILLED_SYRINGE | Freq: Two times a day (BID) | INTRAMUSCULAR | 2 refills | Status: DC

## 2022-11-26 MED ORDER — MAGNESIUM HYDROXIDE 400 MG/5ML PO SUSP
30.0000 mL | Freq: Once | ORAL | Status: AC
  Administered 2022-11-26: 30 mL via ORAL
  Filled 2022-11-26: qty 30

## 2022-11-26 MED ORDER — PANTOPRAZOLE SODIUM 40 MG PO TBEC: 40.0000 mg | DELAYED_RELEASE_TABLET | Freq: Every day | ORAL | 0 refills | Status: AC

## 2022-11-26 MED ORDER — OXYCODONE HCL 5 MG PO CAPS: 5.0000 mg | ORAL_CAPSULE | Freq: Three times a day (TID) | ORAL | 0 refills | Status: AC | PRN

## 2022-11-26 MED ORDER — ENOXAPARIN (LOVENOX) PATIENT EDUCATION KIT
PACK | Freq: Once | Status: DC
  Filled 2022-11-26: qty 1

## 2022-11-26 MED ORDER — ALUM & MAG HYDROXIDE-SIMETH 200-200-20 MG/5ML PO SUSP
30.0000 mL | Freq: Once | ORAL | Status: AC
  Administered 2022-11-26: 30 mL via ORAL
  Filled 2022-11-26: qty 30

## 2022-11-26 NOTE — Care Management Important Message (Signed)
Important Message  Patient Details IM Letter given. Name: Kathryn Compton MRN: [id number] Date of Birth: 29-Oct-1951   Medicare Important Message Given:  Yes     Kerin Salen 11/26/2022, 10:28 AM

## 2022-11-26 NOTE — Telephone Encounter (Signed)
RD called pt to verify fluid intake once starting soft, solid proteins 2 week post-bariatric surgery.   Daily Fluid intake:  Daily Protein intake:  Bowel Habits:   Concerns/issues:  Pt states she called the surgeon the 20th (Wednesday), due to nothing going down.  Surgeon advised to go back to liquids.  Pt went to the ED that night.  CT scan found blood clots, also removed hiatal hernia and lipoma. Pt still in hospital as of today/call.

## 2022-11-26 NOTE — Progress Notes (Addendum)
[facility] for IV heparin Indication: SM/portal vein thrombosis  Allergies  Allergen Reactions   Other Itching and Other (See Comments)    Antidepressants-Itching, Gaining Weight Tolerates citalopram    Patient Measurements:   Heparin Dosing Weight: 89 kg (used [place] nomogram for initial dosing)  Vital Signs: Temp: 98.3 F (36.8 C) (12/26 0549) Temp Source: Oral (12/26 0549) BP: 144/60 (12/26 0549) Pulse Rate: 65 (12/26 0549)  Labs: Recent Labs    11/23/22 0732 11/23/22 1204 11/24/22 0634 11/25/22 0749 11/26/22 0608  HGB  --    < > 10.9* 10.3* 10.1*  HCT  --   --  35.1* 32.8* 31.8*  PLT  --   --  304 283 297  HEPARINUNFRC  --    < > 0.31 0.34 0.30  CREATININE 0.81  --  0.76  --   --    < > = values in this interval not displayed.    Estimated Creatinine Clearance: 87.5 mL/min (by C-G formula based on SCr of 0.76 mg/dL).  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 No plans for vascular intervention at this time; Pharmacy to dose IV heparin.  Significant events:  Today, 11/26/2022: Heparin level 0.3 (therapeutic) on infusion at 1650 units/hr No bleeding or infusion interruptions reported H/H slightly low but stable.  Pltc WNL.  Goal of Therapy: Heparin level 0.3-0.7 units/ml Monitor platelets by anticoagulation protocol: Yes  Plan: Continue Heparin IV continuous infusion at 1650 units/hr  Daily CBC, daily heparin level Monitor for signs of bleeding or thrombosis F/u plans for long-term anticoag after advancing PO intake and diet  Thank you for allowing pharmacy to be a part of this patient's care.  Elenor Quinones, PharmD, BCPS, BCIDP Clinical Pharmacist 11/26/2022 7:15 AM  ADDENDUM:  Surgery transitioning to enoxaparin given her difficult with swallowing  pills. Plan for enoxaparin for at least a month or until follow up with hematology.  Plan: Stop heparin infusion when first dose of enoxaparin given Start enoxaparin '120mg'$  [place] Q12h at 10am Monitor CBC, s/s of bleed Requested co-pay check and should be $0  Elenor Quinones, PharmD, BCPS, BCIDP Clinical Pharmacist 11/26/2022 8:21 AM

## 2022-11-26 NOTE — Progress Notes (Signed)
   Subjective/Chief Complaint: Pain is returning seemingly less frequently.  She took 3 doses of pain medication yesterday.  She was able to take in 1350 p.o. yesterday, but did still have a pill stick at the end of the day.  Urine output has been good.  Walking the halls.  No bowel movement still but passing a lot of flatus.   Objective: Vital signs in last 24 hours: Temp:  [98.1 F (36.7 C)-98.3 F (36.8 C)] 98.3 F (36.8 C) (12/26 0549) Pulse Rate:  [65-66] 65 (12/26 0549) Resp:  [14-17] 14 (12/26 0549) BP: (132-149)/(60-77) 144/60 (12/26 0549) SpO2:  [95 %-97 %] 97 % (12/26 0549) Last BM Date : 11/21/22  Intake/Output from previous day: 12/25 0701 - 12/26 0700 In: 1429.5 [P.O.:1350; I.V.:79.5] Out: 1800 [Urine:1800] Intake/Output this shift: No intake/output data recorded.  A&Ox3, no distress Unlabored respirations Abd soft, nondistended, remains mildly diffusely tender this seems less than previous days.  Incisions are well-healed.  Lab Results:  Recent Labs    11/25/22 0749 11/26/22 0608  WBC 7.1 6.6  HGB 10.3* 10.1*  HCT 32.8* 31.8*  PLT 283 297    BMET Recent Labs    11/24/22 0634 11/26/22 0608  NA 137 141  K 3.5 3.5  CL 107 107  CO2 26 27  GLUCOSE 84 89  BUN 6* <5*  CREATININE 0.76 0.75  CALCIUM 8.1* 8.9    PT/INR No results for input(s): "LABPROT", "INR" in the last 72 hours. ABG No results for input(s): "PHART", "HCO3" in the last 72 hours.  Invalid input(s): "PCO2", "PO2"  Studies/Results: No results found.  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 Per op note "On dissecting the hiatus there was a lipoma along the esophagus concerning for hiatal hernia. THe UGI was negative. The posterior area was dissected out showing a small hiatal hernia. This was repaired with 2-0 Ethibond."  SMV thrombosis- continue IV fluids, therapeutic heparin. Benign abdominal  exam with improving pain, some of which may be related to constipation. Will try bariatric fulls. Will add breeze protein shakes.  Vital signs reassuring, no leukocytosis.  Will transition to therapeutic Lovenox with plan to discharge her on that for the next month or until follow-up with hematology oncology, given her difficulty with swallowing pills currently.   Dysphagia- suspect due to ongoing edema from excision of peri-esophageal lipoma and HH repair. Was patent to dilator and endoscope at time of surgery.  Seems to be doing better with liquids and was able to take in an adequate amount of p.o. yesterday.  Constipation-no result with suppository scheduled MiraLAX.  Will try dose of Milk of Magnesia.   If Lovenox teaching and home Lovenox can be arranged, and she has a bowel movement today, from my standpoint she can be discharged. Follow up with Dr. Kieth Brightly, heme-onc.    LOS: 3 days    Kathryn Compton 11/26/2022

## 2022-11-26 NOTE — TOC Benefit Eligibility Note (Signed)
Patient Teacher, English as a foreign language completed.    The patient is currently admitted and upon discharge could be taking enoxaparin (Lovenox) 120 mg/0.8 ml injection.  The current 30 day co-pay is $0.00.   The patient is insured through [employer], [place] Patient Advocate Specialist [place] Patient Advocate Team Direct Number: [phone]  Fax: [phone]

## 2022-11-26 NOTE — Discharge Instructions (Signed)
################################################################  LAPAROSCOPIC SURGERY: POST OP INSTRUCTIONS  ######################################################################  EAT Gradually transition to a high fiber diet with a fiber supplement over the next few weeks after discharge.  Start with a pureed / full liquid diet (see below)  WALK Walk an hour a day.  Control your pain to do that.    CONTROL PAIN Control pain so that you can walk, sleep, tolerate sneezing/coughing, go up/down stairs.  HAVE A BOWEL MOVEMENT DAILY Keep your bowels regular to avoid problems.  OK to try a laxative to override constipation.  OK to use an antidairrheal to slow down diarrhea.  Call if not better after 2 tries  CALL IF YOU HAVE PROBLEMS/CONCERNS Call if you are still struggling despite following these instructions. Call if you have concerns not answered by these instructions  ######################################################################    DIET: Follow your bariatric gastric sleeve pathway information.  Being discharged on full liquid diet for now.  Take your usually prescribed home medications unless otherwise directed.  PAIN CONTROL: Pain is best controlled by a usual combination of three different methods TOGETHER: Ice/Heat Over the counter pain medication Prescription pain medication Most patients will experience some swelling and bruising around the incisions.  Ice packs or heating pads (30-60 minutes up to 6 times a day) will help. Use ice for the first few days to help decrease swelling and bruising, then switch to heat to help relax tight/sore spots and speed recovery.  Some people prefer to use ice alone, heat alone, alternating between ice & heat.  Experiment to what works for you.  Swelling and bruising can take several weeks to resolve.   It is helpful to take an over-the-counter pain medication regularly for the first few weeks.  Choose one of the following that  works best for you:  Acetaminophen (Tylenol, etc) 500-'650mg'$  four times a day (every meal & bedtime) A  prescription for pain medication (such as oxycodone, hydrocodone, tramadol, gabapentin, methocarbamol, etc) should be given to you upon discharge.  Take your pain medication as prescribed.  If you are having problems/concerns with the prescription medicine (does not control pain, nausea, vomiting, rash, itching, etc), please call us [phone] to see if we need to switch you to a different pain medicine that will work better for you and/or control your side effect better. If you need a refill on your pain medication, please give [place] 48 hour notice.  contact your pharmacy.  They will contact our office to request authorization. Prescriptions will not be filled after 5 pm or on week-ends  Avoid getting constipated.   Between the surgery and the pain medications, it is common to experience some constipation.   Increasing fluid intake and taking a fiber supplement (such as Metamucil, Citrucel, FiberCon, MiraLax, etc) 1-2 times a day regularly will usually help prevent this problem from occurring.   A mild laxative (prune juice, Milk of Magnesia, MiraLax, etc) should be taken according to package directions if there are no bowel movements after 48 hours.   Watch out for diarrhea.   If you have many loose bowel movements, simplify your diet to bland foods & liquids for a few days.   Stop any stool softeners and decrease your fiber supplement.   Switching to mild anti-diarrheal medications (Kayopectate, Pepto Bismol) can help.   If this worsens or does not improve, please call us.  Wash / shower every day.  You may shower over the dressings as they are waterproof.  Continue to shower over incision(s)  after the dressing is off.  It is good for closed incisions and even open wounds to be washed every day.  Shower every day.  Short baths are fine.  Wash the incisions and wounds clean with soap & water.     You may leave closed incisions open to air if it is dry.   You may cover the incision with clean gauze & replace it after your daily shower for comfort.      ACTIVITIES as tolerated:   You may resume regular (light) daily activities beginning the next day--such as daily self-care, walking, climbing stairs--gradually increasing activities as tolerated.  If you can walk 30 minutes without difficulty, it is safe to try more intense activity such as jogging, treadmill, bicycling, low-impact aerobics, swimming, etc. Save the most intensive and strenuous activity for last such as sit-ups, heavy lifting, contact sports, etc  Refrain from any heavy lifting or straining until you are off narcotics for pain control.   DO NOT PUSH THROUGH PAIN.  Let pain be your guide: If it hurts to do something, don't do it.  Pain is your body warning you to avoid that activity for another week until the pain goes down. You may drive when you are no longer taking prescription pain medication, you can comfortably wear a seatbelt, and you can safely maneuver your car and apply brakes. You may have sexual intercourse when it is comfortable.  FOLLOW UP in our office Please call [facility] at [phone] to set up an appointment to see your surgeon in the office for a follow-up appointment approximately 2-3 weeks after your surgery. Make sure that you call for this appointment the day you arrive home to insure a convenient appointment time.  10. IF YOU HAVE DISABILITY OR FAMILY LEAVE FORMS, BRING THEM TO THE OFFICE FOR PROCESSING.  DO NOT GIVE THEM TO YOUR DOCTOR.   WHEN TO CALL us [phone]: Poor pain control Reactions / problems with new medications (rash/itching, nausea, etc)  Fever over 101.5 F (38.5 C) Inability to urinate Nausea and/or vomiting Worsening swelling or bruising Continued bleeding from incision. Increased pain, redness, or drainage from the incision   The clinic staff is available to answer  your questions during regular business hours (8:30am-5pm).  Please don't hesitate to call and ask to speak to one of our nurses for clinical concerns.   If you have a medical emergency, go to the nearest emergency room or call 911.  A surgeon from [facility] is always on call at the [facility], [place], [place], [place], [place]  [address] ? MAIN: [phone] ? TOLL FREE: [phone] ?  FAX [phone] [email or web address]  ##############################################################

## 2022-11-26 NOTE — Discharge Summary (Signed)
Physician Discharge Summary   Patient: Kathryn Compton MRN: [id number] DOB: 19-May-1951  Admit date:     11/22/2022  Discharge date: 11/26/22  Discharge Physician: Oswald Hillock   PCP: Carrolyn Meiers, MD   Recommendations at discharge:   Follow-up general surgery as outpatient Follow-up hematology/oncology as outpatient Ambulatory referral has been sent from the hospital for hypercoagulable workup Continue taking Lovenox 120 mg subcu twice daily for at least 3 months  Discharge Diagnoses: Principal Problem:   Portal vein thrombosis  Resolved Problems:   * No resolved hospital problems. *  Hospital Course:  71 y.o. female, with history of hypertension, depression came to ED with abdominal pain.  Patient recently had gastric sleeve surgery on December 5 done by Dr. Alvino Blood.  Patient started having significant epigastric abdominal pain since last night.  Patient states that she was having intermittent nausea and abdominal pain since surgery but pain became worse last night.   CT abdomen/pelvis showed thrombosis of SMV and portal vein.  Patient started on heparin.  General surgery was consulted.  No plan for intervention as per vascular surgery.   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 Doren Custard, no need for intervention -Discussed with hematology/oncology, patient will benefit from anticoagulation workup as outpatient -Will need to follow with hematology/oncology as outpatient; will send ambulatory referral to hematology/oncology for hypercoagulable workup as outpatient   Abdominal pain -Likely postsurgical -As per general surgery patient had lipoma which was resected and also small hiatal hernia which was repaired during surgery on November 05, 2022 -Likely edema from that procedure -Patient has significantly improved, general surgery has  recommended to discharge home -Patient to follow-up with general surgery as outpatient -Oxycodone as needed has been sent to pharmacy   Depression -Continue Celexa   Hypertension -Continue propranolol   Constipation -Resolved -Started on MiraLAX -Smog enema x 1 -Also received milk of magnesia today          Consultants: General surgery Procedures performed: none  Disposition: Home Diet recommendation:  Discharge Diet Orders (From admission, onward)     Start     Ordered   11/26/22 0000  Diet - low sodium heart healthy        11/26/22 1629           Regular diet DISCHARGE MEDICATION: Allergies as of 11/26/2022       Reactions   Other Itching, Other (See Comments)   Antidepressants-Itching, Gaining Weight Tolerates citalopram         Medication List     STOP taking these medications    oxyCODONE 5 MG immediate release tablet Commonly known as: Oxy IR/ROXICODONE Replaced by: oxycodone 5 MG capsule       TAKE these medications    Aimovig 70 MG/ML Soaj Generic drug: Erenumab-aooe Inject 70 mg into the skin every 30 (thirty) days.   Bariatric Fusion Chew Chew 1 tablet by mouth daily.   citalopram 20 MG tablet Commonly known as: CELEXA Take 20 mg by mouth daily as needed (for unresolved depression).   citalopram 40 MG tablet Commonly known as: CELEXA Take 40 mg by mouth daily.   COLLAGEN PO Take 1 Scoop by mouth daily. Mix and drink as directed   docusate sodium 100 MG capsule Commonly known as: COLACE Take 100 mg by mouth in the morning.   enoxaparin 120 MG/0.8ML injection Commonly known as: LOVENOX  Inject 0.8 mLs (120 mg total) into the skin every 12 (twelve) hours.   fluticasone 0.05 % cream Commonly known as: CUTIVATE Apply 1 application  topically daily as needed (for rashes).   hydrOXYzine 25 MG tablet Commonly known as: ATARAX Take 6.25-12.5 mg by mouth every 6 (six) hours as needed for anxiety (or sleep).   ketotifen  0.035 % ophthalmic solution Commonly known as: ZADITOR Place 1 drop into both eyes 2 (two) times daily as needed (itching).   ondansetron 4 MG disintegrating tablet Commonly known as: ZOFRAN-ODT Take 1 tablet (4 mg total) by mouth every 6 (six) hours as needed for nausea or vomiting.   oxycodone 5 MG capsule Commonly known as: OXY-IR Take 1 capsule (5 mg total) by mouth every 8 (eight) hours as needed for up to 5 days. For pain not relieved with Tylenol, rest, etc Replaces: oxyCODONE 5 MG immediate release tablet   pantoprazole 40 MG tablet Commonly known as: PROTONIX Take 1 tablet (40 mg total) by mouth daily.   propranolol 40 MG tablet Commonly known as: INDERAL Take 1 tablet (40 mg total) by mouth 2 (two) times daily. What changed: when to take this   Tums Ultra 1000 400 MG chewable tablet Generic drug: calcium elemental as carbonate Chew 1,000 mg by mouth in the morning and at bedtime.   Vitamin D3 50 MCG (2000 UT) Tabs Take 2,000 Units by mouth daily.        Follow-up Information     Kinsinger, Arta Bruce, MD. Schedule an appointment as soon as possible for a visit on 12/05/2022.   Specialty: General Surgery Contact information: [address] [place] [address] [phone]         Brunetta Genera, MD. Schedule an appointment as soon as possible for a visit.   Specialties: Hematology, Oncology Why: Ambulatory referral has been sent from the hospital Contact information: [place] [address] [phone]                Discharge Exam: There were no vitals filed for this visit. General-appears in no acute distress Heart-S1-S2, regular, no murmur auscultated Lungs-clear to auscultation bilaterally, no wheezing or crackles auscultated Abdomen-soft, nontender, no organomegaly Extremities-no edema in the lower extremities Neuro-alert, oriented x3, no focal deficit noted  Condition at discharge:  good  The results of significant diagnostics from this hospitalization (including imaging, microbiology, ancillary and laboratory) are listed below for reference.   Imaging Studies: CT Abdomen Pelvis W Contrast  Result Date: 11/22/2022 CLINICAL DATA:  Abdominal pain and nausea since yesterday, history of bariatric surgery 11/05/2022 EXAM: CT ABDOMEN AND PELVIS WITH CONTRAST TECHNIQUE: Multidetector CT imaging of the abdomen and pelvis was performed using the standard protocol following bolus administration of intravenous contrast. RADIATION DOSE REDUCTION: This exam was performed according to the departmental dose-optimization program which includes automated exposure control, adjustment of the mA and/or kV according to patient size and/or use of iterative reconstruction technique. CONTRAST:  117m OMNIPAQUE IOHEXOL 300 MG/ML  SOLN COMPARISON:  None Available. FINDINGS: Lower chest: No acute pleural or parenchymal lung disease. Hepatobiliary: There is acute appearing thrombosis of the intrahepatic portal venous system, extending to the porta hepatis. There is normal opacification of the portal vein between the splenic confluence of the porta hepatis, as well as normal opacification of the splenic vein. The SMV demonstrates acute thrombosis. Otherwise the liver is unremarkable. The gallbladder is grossly normal. No biliary duct dilation. Pancreas: Unremarkable. No pancreatic  ductal dilatation or surrounding inflammatory changes. Spleen: Normal in size without focal abnormality. Adrenals/Urinary Tract: Adrenal glands are unremarkable. Kidneys are normal, without renal calculi, focal solid lesion, or hydronephrosis. Bladder is unremarkable. Stomach/Bowel: No bowel obstruction or ileus. Normal appendix right lower quadrant. Postsurgical changes from gastric sleeve procedure. No bowel wall thickening. Vascular/Lymphatic: There is acute thrombosis of the SMV and its tributaries. The splenic vein and extrahepatic  main portal vein are patent. There is diffuse thrombosis of the intrahepatic portal venous system, with relative sparing of the posterior aspect of the right lobe. Atherosclerosis of the abdominal aorta and its branches. No pathologic adenopathy. Reproductive: Uterus and bilateral adnexa are unremarkable. Other: There is mesenteric edema associated with the SMV thrombosis. No free fluid or free intraperitoneal gas. Postsurgical changes within the abdominal wall consistent with recent laparoscopic procedure. No fluid collection or hematoma. No abdominal wall hernia. Musculoskeletal: No acute or destructive bony lesions. Reconstructed images demonstrate no additional findings. IMPRESSION: 1. 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 2. Postsurgical changes from recent gastric sleeve procedure. No bowel obstruction or ileus. Critical Value/emergent results were called by telephone at the time of interpretation on 11/22/2022 at 3:22 pm to provider Fredia Sorrow , who verbally acknowledged these results. Electronically Signed   By: Randa Ngo M.D.   On: 11/22/2022 15:25    Microbiology: Results for orders placed or performed in visit on 12/07/20  Novel Coronavirus, NAA ([facility])     Status: Abnormal   Collection Time: 12/07/20 12:00 AM   Specimen: Nasopharyngeal(NP) swabs in vial transport medium   Nasopharynge  Screenin  Result Value Ref Range Status   SARS-CoV-2, NAA Detected (A) Not Detected Final    Comment: Patients who have a positive COVID-19 test result may now have treatment options. Treatment options are available for patients with mild to moderate symptoms and for hospitalized patients. Visit our website at [email or web address] for resources and information. This nucleic acid amplification test was developed and its performance characteristics determined by [employer]. Nucleic acid amplification tests include RT-PCR  and TMA. This test has not been FDA cleared or approved. This test has been authorized by FDA under an Emergency Use Authorization (EUA). This test is only authorized for the duration of time the declaration that circumstances exist justifying the authorization of the emergency use of in vitro diagnostic tests for detection of SARS-CoV-2 virus and/or diagnosis of COVID-19 infection under section 564(b)(1) of the Act, 21 U.S.C. 892JJH-4(R) (1), unless the authorization is terminated or revoked sooner. When diagnostic testing is negativ e, the possibility of a false negative result should be considered in the context of a patient's recent exposures and the presence of clinical signs and symptoms consistent with COVID-19. An individual without symptoms of COVID-19 and who is not shedding SARS-CoV-2 virus would expect to have a negative (not detected) result in this assay.   SARS-COV-2, NAA 2 DAY TAT     Status: None   Collection Time: 12/07/20 12:00 AM   Nasopharynge  Screenin  Result Value Ref Range Status   SARS-CoV-2, NAA 2 DAY TAT Performed  Final    Labs: CBC: Recent Labs  Lab 11/22/22 1143 11/23/22 0246 11/24/22 0634 11/25/22 0749 11/26/22 0608  WBC 10.4 9.6 7.9 7.1 6.6  HGB 12.8 11.8* 10.9* 10.3* 10.1*  HCT 40.3 36.9 35.1* 32.8* 31.8*  MCV 85.9 87.6 89.5 86.1 86.4  PLT 371 272 304 283 740   Basic Metabolic Panel:  Recent Labs  Lab 11/22/22 1143 11/23/22 0732 11/24/22 0634 11/26/22 0608  NA 138 139 137 141  K 4.0 3.6 3.5 3.5  CL 103 107 107 107  CO2 '26 24 26 27  '$ GLUCOSE 105* 88 84 89  BUN 18 10 6* <5*  CREATININE 0.99 0.81 0.76 0.75  CALCIUM 9.1 8.5* 8.1* 8.9  MG  --   --  2.2  --    Liver Function Tests: Recent Labs  Lab 11/22/22 1143 11/23/22 0732 11/26/22 0608  AST '21 16 23  '$ ALT '23 19 23  '$ ALKPHOS 65 57 60  BILITOT 0.9 0.8 0.8  PROT 8.2* 6.5 6.2*  ALBUMIN 3.8 3.0* 2.8*   CBG: No results for input(s): "GLUCAP" in the last 168 hours.  Discharge  time spent: greater than 30 minutes.  Signed: Oswald Hillock, MD [facility] 11/26/2022

## 2022-11-28 ENCOUNTER — Ambulatory Visit: Payer: Medicare Other | Admitting: Clinical

## 2022-11-28 ENCOUNTER — Telehealth (HOSPITAL_COMMUNITY): Payer: Self-pay | Admitting: *Deleted

## 2022-11-28 NOTE — Telephone Encounter (Signed)
Pt returned f/u phone call.  Pt states that she was having difficulty drinking fluids and was not able to advance to Phase 3 diet while developing severe abdominal pain.  During hospitalization, pt was able to tolerate shakes and water prior to discharge. Since discharge, pt states that she is getting in at least 40oz of fluid including protein shakes, bottled water, and broth.  Pt states that she can take a bite/dip and wait 10 minutes before taking another bite/sip. Encouraged pt to keep fluid/food diet daily to share with surgeon.   Pt states that she is taking the Lovenox injections without difficulty.  Reinforced education about proper hand hygiene, taking injections q12h and rotating injection sites.  Pt also instructed to monitor for unusual bruising and/or signs of bleeding.  Pt last BM was on 11/26/22 after receiving Milk of Magnesium during hospitalization.  Pt instructed to take either Miralax or MoM as instructed per "Gastric Bypass/Sleeve Discharge Home Care Instructions" to avoid constipation.  Pt states that she will continue to works towards goal of 64 oz of fluid.  Pt encouraged to continue to work towards meeting goal.  Pt instructed to assess status and suggestions daily utilizing Hydration Action Plan on discharge folder and to call [facility] if in the "red zone".

## 2022-11-28 NOTE — Progress Notes (Unsigned)
Called patient to discuss her upcoming appt on 12/03/22 w/ Dr.Keeling for gel injections. Due to her recent surgery and complications afterwards, we need to push her appt out until the week of Dec 23, 2022.  No answer. Left generic vm asking for a return call

## 2022-11-29 NOTE — BH Specialist Note (Signed)
Integrated Behavioral Health via Telemedicine Visit  12/09/2022 Ralene Gasparyan [phone]  Number of [place] Clinician visits: Additional Visit  Session Start time: 6606   Session End time: 1050  Total time in minutes: 4  Referring Provider: Darron Doom, MD Patient/Family location: [facility] Provider location: [facility] [place] [facility]  All persons participating in visit: Patient Morgan Kein[place] and [place]   Types of Service: Individual psychotherapy and Telephone visit  Brief telephone visit; pt is temporarily staying in [place]; will call back to schedule follow-up visit after return to [place].   Pt currently concerned regarding recovery post-surgery after having very painful blood clots and swollen stomach, along with being unable to keep food or liquids down weeks after surgery. She will follow up with medical providers at scheduled visits in [place].   Caroleen Hamman Margy Sumler, LCSW

## 2022-12-01 DIAGNOSIS — I10 Essential (primary) hypertension: Secondary | ICD-10-CM | POA: Diagnosis not present

## 2022-12-03 ENCOUNTER — Ambulatory Visit: Payer: Medicare Other | Admitting: Orthopaedic Surgery

## 2022-12-05 ENCOUNTER — Other Ambulatory Visit: Payer: Self-pay | Admitting: General Surgery

## 2022-12-06 ENCOUNTER — Ambulatory Visit (INDEPENDENT_AMBULATORY_CARE_PROVIDER_SITE_OTHER): Payer: Medicare Other

## 2022-12-06 VITALS — BP 137/67 | HR 66 | Temp 97.7°F | Resp 16 | Ht 67.0 in | Wt 254.0 lb

## 2022-12-06 DIAGNOSIS — E860 Dehydration: Secondary | ICD-10-CM | POA: Diagnosis not present

## 2022-12-06 MED ORDER — THIAMINE HCL 100 MG/ML IJ SOLN
Freq: Once | INTRAVENOUS | Status: AC
  Filled 2022-12-06: qty 1000

## 2022-12-06 MED ORDER — SODIUM CHLORIDE 0.9 % IV BOLUS
1000.0000 mL | Freq: Once | INTRAVENOUS | Status: AC
  Administered 2022-12-06: 1000 mL via INTRAVENOUS

## 2022-12-06 NOTE — Progress Notes (Signed)
Diagnosis: Dehydration  Provider:  Marshell Garfinkel MD  Procedure: Infusion  IV Type: Peripheral, IV Location: Left wrist  Normal Saline, Dose: 1000 ml  Infusion Start Time: 8381  Infusion Stop Time: 8403   Banana Bag, Dose: 1000 ml  Infusion Start Time: 1050  Infusion Stop Time: 7543  Post Infusion IV Care: Peripheral IV Discontinued  Discharge: Condition: Good, Destination: Home . AVS provided to patient.   Performed by:  Koren Shiver, RN

## 2022-12-09 ENCOUNTER — Ambulatory Visit: Payer: Medicare Other | Admitting: Clinical

## 2022-12-09 DIAGNOSIS — F411 Generalized anxiety disorder: Secondary | ICD-10-CM

## 2022-12-10 ENCOUNTER — Ambulatory Visit: Payer: Medicare Other | Admitting: Orthopaedic Surgery

## 2022-12-17 ENCOUNTER — Ambulatory Visit: Payer: Medicare Other | Admitting: Orthopaedic Surgery

## 2022-12-19 DIAGNOSIS — I10 Essential (primary) hypertension: Secondary | ICD-10-CM | POA: Diagnosis not present

## 2022-12-19 DIAGNOSIS — E860 Dehydration: Secondary | ICD-10-CM | POA: Diagnosis not present

## 2022-12-19 DIAGNOSIS — R42 Dizziness and giddiness: Secondary | ICD-10-CM | POA: Diagnosis not present

## 2022-12-19 DIAGNOSIS — R110 Nausea: Secondary | ICD-10-CM | POA: Diagnosis not present

## 2022-12-19 DIAGNOSIS — Z87440 Personal history of urinary (tract) infections: Secondary | ICD-10-CM | POA: Diagnosis not present

## 2022-12-26 ENCOUNTER — Telehealth: Payer: Self-pay | Admitting: Neurology

## 2022-12-26 NOTE — Telephone Encounter (Signed)
Called pt. Schedule her a appointment with Dr. Krista Blue for 3/7 @ 1:30 pm. Pt was added to wait list for a sooner appointment.

## 2022-12-26 NOTE — Telephone Encounter (Signed)
Pt called requesting RN to call her back to discuss a blood clot that she has developed and to discuss possible interaction with the Erenumab-aooe ([place]) 28 MG/ML SOAJ  She states its been working great until this happened. She also states that her symptoms have returned.

## 2022-12-26 NOTE — Telephone Encounter (Signed)
Noted, thank you

## 2022-12-31 ENCOUNTER — Encounter: Payer: Self-pay | Admitting: Dietician

## 2022-12-31 ENCOUNTER — Encounter: Payer: 59 | Attending: General Surgery | Admitting: Dietician

## 2022-12-31 DIAGNOSIS — E669 Obesity, unspecified: Secondary | ICD-10-CM | POA: Insufficient documentation

## 2022-12-31 DIAGNOSIS — Z9884 Bariatric surgery status: Secondary | ICD-10-CM | POA: Insufficient documentation

## 2022-12-31 DIAGNOSIS — Z713 Dietary counseling and surveillance: Secondary | ICD-10-CM | POA: Insufficient documentation

## 2022-12-31 NOTE — Progress Notes (Addendum)
Bariatric Nutrition Follow-Up Visit Medical Nutrition Therapy  Appt Start Time: 2:01  End Time: 2:34  Virtual Visit via Video Note  I connected with Kathryn Compton on 12/31/22 at  2:00 PM EST virtually by phone and verified that I am speaking with the correct person using two identifiers. I discussed the limitations of this type of visit and the patient expressed understanding and agreed to proceed.  Surgery date: 11/05/2022 Surgery type: Sleeve Gastrectomy w/ hiatal hernia repair  NUTRITION ASSESSMENT    Anthropometrics Start Weight at [facility]: 267.9lbs  (04/19/22) Height: 5'7"  Weight today:  virtual appointment, no weight taken (pt has not weighed herself since last visit) BMI:   Clinical Medications: see list Health/ medical history changes: ED visit; hospital stay for 6 days; hydration clinic visit   Lifestyle & Dietary Hx  Pt states she developed a blood clot and was in the hospital 6 days and went to the fluid clinic for IV's. Pt had difficulty tolerating the food, stating the surgeon told her he also repaired a hiatal hernia during surgery, and post surgery thing were swollen around the surgery.  Pt states her fluid intake is not great now, but is way better. Pt states she has never in her life drunk 64 oz of fluid in one day. Pt states she was weak getting out of the hospital, so she did not do much for physical activity. Pt states she is getting better with how much she can eat, stating last week she woke up feeling normal. Pt states she has not weighed herself since the last visit. Pt states she has thrush in her mouth.  Estimated daily fluid intake: 30-40 oz Estimated daily protein intake: 40-60 g Supplements: multivitamin and calcium Current average weekly physical activity: ADL's, walking some  24-Hr Dietary Recall First Meal: protein shake Snack:   Second Meal: cottage cheese or meat and cheese roll up Snack:   Third Meal: cottage cheese or meat and cheese roll  up or morning star plant based proteins  Snack:  Beverages: water  Post-Op Goals/ Signs/ Symptoms Using straws: no Drinking while eating: no Chewing/swallowing difficulties: no Changes in vision: no Changes to mood/headaches: no Hair loss/changes to skin/nails: n Difficulty focusing/concentrating: no Sweating: no Limb weakness: no Dizziness/lightheadedness: no Palpitations: no  Carbonated/caffeinated beverages: no N/V/D/C/Gas: no Abdominal pain: no Dumping syndrome: no    NUTRITION DIAGNOSIS  Overweight/obesity ([place]-3.3) related to past poor dietary habits and physical inactivity as evidenced by completed bariatric surgery and following dietary guidelines for continued weight loss and healthy nutrition status.     NUTRITION INTERVENTION Nutrition counseling (C-1) and education (E-2) to facilitate bariatric surgery goals, including: Diet advancement to the next phase now including all vegetables and fruits The importance of consuming adequate calories as well as certain nutrients daily due to the body's need for essential vitamins, minerals, and fats The importance of daily physical activity and to reach a goal of at least 150 minutes of moderate to vigorous physical activity weekly (or as directed by their physician) due to benefits such as increased musculature and improved lab values The importance of intuitive eating specifically learning hunger-satiety cues and understanding the importance of learning a new body: The importance of mindful eating to avoid grazing behaviors  Encouraged patient to honor their body's internal hunger and fullness cues.  Throughout the day, check in mentally and rate hunger. Stop eating when satisfied not full regardless of how much food is left on the plate.  Get more if  still hungry 20-30 minutes later.  The key is to honor satisfaction so throughout the meal, rate fullness factor and stop when comfortably satisfied not physically full. The key is to  honor hunger and fullness without any feelings of guilt or shame.  Pay attention to what the internal cues are, rather than any external factors. This will enhance the confidence you have in listening to your own body and following those internal cues enabling you to increase how often you eat when you are hungry not out of appetite and stop when you are satisfied not full.  Encouraged pt to continue to eat balanced meals inclusive of non starchy vegetables 2 times a day 7 days a week Encourage pt to choose healthy fats such as plant based limiting animal fats Encouraged pt to continue to drink a minium 64 fluid ounces with half being plain water to satisfy proper hydration    Handouts Provided Include  Vegetables and Fruit lists Bariatric MyPlate  Learning Style & Readiness for Change Teaching method utilized: Visual & Auditory  Demonstrated degree of understanding via: Teach Back  Readiness Level: preparation  Barriers to learning/adherence to lifestyle change: tolerance to food  RD's Notes for Next Visit Assess adherence to pt chosen goals  MONITORING & EVALUATION Dietary intake, weekly physical activity, body weight.  Next Steps Patient is to follow-up in 1 months for 3 month post-op follow-up.

## 2023-01-01 DIAGNOSIS — I10 Essential (primary) hypertension: Secondary | ICD-10-CM | POA: Diagnosis not present

## 2023-01-11 NOTE — Progress Notes (Unsigned)
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 female is here at the request of inpatient hospitalist team (Dr. 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  [name] had laparoscopic sleeve gastrectomy performed by Dr. Alvino Blood (general surgery) on 11/05/2022.  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  Vascular surgeon felt no need for intervention.  Patient was treated with IV heparin while hospitalized and discharged with prescription for Lovenox 120 mg SQ twice daily for at least 3 months.  HISTORY: ***  PROVOKING: *** Immobility or travel *** Hospitalization or surgery *** Injury *** History of malignancy and age appropriate screenings? *** Smoking *** Obesity, diabetes, HTN, CKD *** Pregnancy/oral contraceptives *** Heart failure, inflammatory bowel disease *** Personal history of DVT or PE *** Family history of DVT, PE, miscarriages  SYMPTOMS *** Abdominal pain, nausea, vomiting ***Tolerating food?  ANTICOAGULATION with Lovenox twice daily *** Bleeding (current or past history)  PMH: ***Hypertension, anxiety/depression, GERD, arthritis, vertigo, seizures, obesity ***  SOCIAL: ***  FAMILY: ***  MEDICAL HISTORY:  Past Medical History:  Diagnosis Date   Anxiety    Arthritis    Depression    GERD (gastroesophageal reflux disease)    Headache    Hypertension    Motion sickness    Palpitations    Plantar  fasciitis    Seizures ([place])    Vertigo     SURGICAL HISTORY: Past Surgical History:  Procedure Laterality Date   BREAST REDUCTION SURGERY     COLONOSCOPY N/A 09/06/2019   Procedure: COLONOSCOPY;  Surgeon: Danie Binder, MD;  [facility];  Service: Endoscopy;  Laterality: N/A;  2:00   LAPAROSCOPIC GASTRIC SLEEVE RESECTION N/A 11/05/2022   Procedure: LAPAROSCOPIC SLEEVE GASTRECTOMY;  Surgeon: Kieth Brightly, Arta Bruce, MD;  Location: [facility];  Service: General;  Laterality: N/A;   POLYPECTOMY  09/06/2019   Procedure: POLYPECTOMY;  Surgeon: Danie Binder, MD;  Location[facility];  Service: Endoscopy;;   REDUCTION MAMMAPLASTY     TUBAL LIGATION     UPPER GI ENDOSCOPY N/A 11/05/2022   Procedure: UPPER GI ENDOSCOPY;  Surgeon: Kieth Brightly Arta Bruce, MD;  Location: [facility];  Service: General;  Laterality: N/A;    SOCIAL HISTORY: Social History   Socioeconomic History   Marital status: Single    Spouse name: Not on file   Number of children: 2   Years of education: 12   Highest education level: High school graduate  Occupational History   Occupation: Retired  Tobacco Use   Smoking status: Never   Smokeless tobacco: Never  Vaping Use   Vaping Use: Never used  Substance and Sexual Activity   Alcohol use: Never   Drug use: Never   Sexual activity: Yes    Birth control/protection: Surgical    Comment: tubal  Other Topics Concern   Not on file  Social History Narrative   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   Right-handed.   Lives with significant  other.   One cup caffeine per day, occasional glass of tea.   Social Determinants of Health   Financial Resource Strain: Medium Risk (08/01/2020)   Overall Financial Resource Strain (CARDIA)    Difficulty of Paying Living Expenses: Somewhat hard  Food Insecurity: No Food Insecurity (11/22/2022)   Hunger Vital Sign    Worried About Running Out of Food in the Last Year: Never true    Ran Out of Food in  the Last Year: Never true  Transportation Needs: No Transportation Needs (11/22/2022)   PRAPARE - Hydrologist (Medical): No    Lack of Transportation (Non-Medical): No  Physical Activity: Inactive (08/01/2020)   Exercise Vital Sign    Days of Exercise per Week: 0 days    Minutes of Exercise per Session: 0 min  Stress: Stress Concern Present (08/01/2020)   [place]    Feeling of Stress : Very much  Social Connections: Socially Isolated (08/01/2020)   Social Connection and Isolation Panel [NHANES]    Frequency of Communication with Friends and Family: More than three times a week    Frequency of Social Gatherings with Friends and Family: More than three times a week    Attends Religious Services: Never    Marine scientist or Organizations: No    Attends Archivist Meetings: Never    Marital Status: Divorced  Human resources officer Violence: Not At Risk (11/22/2022)   Humiliation, Afraid, Rape, and Kick questionnaire    Fear of Current or Ex-Partner: No    Emotionally Abused: No    Physically Abused: No    Sexually Abused: No    FAMILY HISTORY: Family History  Problem Relation Age of Onset   Other Mother        unsure of medical history   Other Father        unknown medical history   Drug abuse Brother    Suicidality Brother    Colon cancer Neg Hx     ALLERGIES:  is allergic to other.  MEDICATIONS:  Current Outpatient Medications  Medication Sig Dispense Refill   Cholecalciferol (VITAMIN D3) 50 MCG (2000 UT) TABS Take 2,000 Units by mouth daily.     citalopram (CELEXA) 20 MG tablet Take 20 mg by mouth daily as needed (for unresolved depression).     citalopram (CELEXA) 40 MG tablet Take 40 mg by mouth daily.     COLLAGEN PO Take 1 Scoop by mouth daily. Mix and drink as directed     docusate sodium (COLACE) 100 MG capsule Take 100 mg by mouth in the morning.      enoxaparin (LOVENOX) 120 MG/0.8ML injection Inject 0.8 mLs (120 mg total) into the skin every 12 (twelve) hours. 60 mL 2   Erenumab-aooe (AIMOVIG) 70 MG/ML SOAJ Inject 70 mg into the skin every 30 (thirty) days. 1.12 mL 11   fluticasone (CUTIVATE) 0.05 % cream Apply 1 application  topically daily as needed (for rashes).     hydrOXYzine (ATARAX) 25 MG tablet Take 6.25-12.5 mg by mouth every 6 (six) hours as needed for anxiety (or sleep).     ketotifen (ZADITOR) 0.035 % ophthalmic solution Place 1 drop into both eyes 2 (two) times daily as needed (itching).     Multiple Vitamins-Minerals (BARIATRIC FUSION) CHEW Chew 1 tablet by mouth daily.     ondansetron (ZOFRAN-ODT) 4 MG disintegrating tablet Take 1 tablet (4 mg total) by mouth every  6 (six) hours as needed for nausea or vomiting. 20 tablet 0   pantoprazole (PROTONIX) 40 MG tablet Take 1 tablet (40 mg total) by mouth daily. 90 tablet 0   propranolol (INDERAL) 40 MG tablet Take 1 tablet (40 mg total) by mouth 2 (two) times daily. (Patient taking differently: Take 40 mg by mouth in the morning and at bedtime.) 60 tablet 6   TUMS ULTRA 1000 1000 MG chewable tablet Chew 1,000 mg by mouth in the morning and at bedtime.     No current facility-administered medications for this visit.   Facility-Administered Medications Ordered in Other Visits  Medication Dose Route Frequency Provider Last Rate Last Admin   ondansetron (ZOFRAN-ODT) disintegrating tablet 4 mg  4 mg Oral Q4H PRN Kinsinger, Arta Bruce, MD       Or   ondansetron (ZOFRAN) 4 mg in sodium chloride 0.9 % 50 mL IVPB  4 mg Intravenous Q4H PRN Kinsinger, Arta Bruce, MD       sodium chloride 0.9 % bolus 2,000 mL  2,000 mL Intravenous Once Kinsinger, Arta Bruce, MD        REVIEW OF SYSTEMS:  ***  Review of Systems - Oncology    PHYSICAL EXAMINATION:   ECOG PERFORMANCE STATUS: {CHL ONC ECOG [id number] *** There were no vitals filed for this visit. There were no vitals filed for this  visit.  Physical Exam    LABORATORY DATA:  I have reviewed the data as listed Recent Results (from the past 2160 hour(s))  CBC with Differential/Platelet     Status: None   Collection Time: 10/30/22  1:30 PM  Result Value Ref Range   WBC 10.0 4.0 - 10.5 K/uL   RBC 4.89 3.87 - 5.11 MIL/uL   Hemoglobin 13.4 12.0 - 15.0 g/dL   HCT 43.3 36.0 - 46.0 %   MCV 88.5 80.0 - 100.0 fL   MCH 27.4 26.0 - 34.0 pg   MCHC 30.9 30.0 - 36.0 g/dL   RDW 15.5 11.5 - 15.5 %   Platelets 354 150 - 400 K/uL   nRBC 0.0 0.0 - 0.2 %   Neutrophils Relative % 66 %   Neutro Abs 6.6 1.7 - 7.7 K/uL   Lymphocytes Relative 23 %   Lymphs Abs 2.3 0.7 - 4.0 K/uL   Monocytes Relative 8 %   Monocytes Absolute 0.8 0.1 - 1.0 K/uL   Eosinophils Relative 2 %   Eosinophils Absolute 0.2 0.0 - 0.5 K/uL   Basophils Relative 1 %   Basophils Absolute 0.1 0.0 - 0.1 K/uL   Immature Granulocytes 0 %   Abs Immature Granulocytes 0.04 0.00 - 0.07 K/uL    Comment: Performed at [facility], [place] [address], [place], [place] [address]  Comprehensive metabolic panel     Status: Abnormal   Collection Time: 10/30/22  1:30 PM  Result Value Ref Range   Sodium 141 135 - 145 mmol/L   Potassium 4.7 3.5 - 5.1 mmol/L   Chloride 107 98 - 111 mmol/L   CO2 27 22 - 32 mmol/L   Glucose, Bld 91 70 - 99 mg/dL    Comment: Glucose reference range applies only to samples taken after fasting for at least 8 hours.   BUN 33 (H) 8 - 23 mg/dL   Creatinine, Ser 0.70 0.44 - 1.00 mg/dL   Calcium 9.4 8.9 - 10.3 mg/dL   Total Protein 7.5 6.5 - 8.1 g/dL   Albumin 3.8 3.5 - 5.0 g/dL  AST 22 15 - 41 U/L   ALT 23 0 - 44 U/L   Alkaline Phosphatase 60 38 - 126 U/L   Total Bilirubin 0.7 0.3 - 1.2 mg/dL   GFR, Estimated >60 >60 mL/min    Comment: (NOTE) Calculated using the CKD-EPI Creatinine Equation (2021)    Anion gap 7 5 - 15    Comment: Performed at [facility], [place] [address] [place], [place] [address]  Type  and screen     Status: None   Collection Time: 10/30/22  1:35 PM  Result Value Ref Range   ABO/RH(D) B POS    Antibody Screen NEG    Sample Expiration 11/08/2022,2359    Extend sample reason      NO TRANSFUSIONS OR PREGNANCY IN THE PAST 3 MONTHS Performed at [place] [address] [place], [place] [address]   ABO/Rh     Status: None   Collection Time: 11/05/22 10:17 AM  Result Value Ref Range   ABO/RH(D)      B POS Performed at [facility], [place] [address] [place], [place] [address]   Surgical pathology     Status: None   Collection Time: 11/05/22 11:58 AM  Result Value Ref Range   SURGICAL PATHOLOGY      SURGICAL PATHOLOGY CASE: [id number] PATIENT: Kathryn Compton Surgical Pathology Report     Clinical History: Morbid obesity (crm)     FINAL MICROSCOPIC DIAGNOSIS:  A. STOMACH, SLEEVE RESECTION: -Gross diagnosis only: Portion of unremarkable stomach.  GROSS DESCRIPTION:  Received fresh is a 17.3 x 3.7 x 2.6 cm partial gastrectomy.  The serosa is smooth and tan.  The margin is stapled.  The mucosa is glistening tan-red with preservation of the rugal folds.  There are no discrete masses or areas of ulceration.  No sections are submitted.  [facility] 11/05/2022)   Final Diagnosis performed by Jaquita Folds, MD.   Electronically signed 11/11/2022 Technical and / or Professional components performed at [facility], [place] [address], [place], [place] [address].  Immunohistochemistry Technical component (if applicable) was performed at [facility]. [address], [place], [place], [place] [address].   IMMUNOHISTOCHEMISTRY DI SCLAIMER (if applicable): Some of these immunohistochemical stains may have been developed and the performance characteristics determine by [facility]. Some may not have been cleared or approved by the U.S. Food and Drug Administration. The FDA has  determined that such clearance or approval is not necessary. This test is used for clinical purposes. It should not be regarded as investigational or for research. This laboratory is certified under the [place] (CLIA-88) as qualified to perform high complexity clinical laboratory testing.  The controls stained appropriately.   CBC     Status: Abnormal   Collection Time: 11/05/22  4:59 PM  Result Value Ref Range   WBC 13.1 (H) 4.0 - 10.5 K/uL   RBC 4.71 3.87 - 5.11 MIL/uL   Hemoglobin 13.1 12.0 - 15.0 g/dL   HCT 41.3 36.0 - 46.0 %   MCV 87.7 80.0 - 100.0 fL   MCH 27.8 26.0 - 34.0 pg   MCHC 31.7 30.0 - 36.0 g/dL   RDW 15.6 (H) 11.5 - 15.5 %   Platelets 274 150 - 400 K/uL   nRBC 0.0 0.0 - 0.2 %    Comment: Performed at [facility], [place] [address] [place], [place] [address]  Creatinine, serum  Status: None   Collection Time: 11/05/22  4:59 PM  Result Value Ref Range   Creatinine, Ser 0.77 0.44 - 1.00 mg/dL   GFR, Estimated >60 >60 mL/min    Comment: (NOTE) Calculated using the CKD-EPI Creatinine Equation (2021) Performed at [facility], [place] [address] [place], [place] [address]   CBC with Differential     Status: Abnormal   Collection Time: 11/06/22  4:43 AM  Result Value Ref Range   WBC 9.6 4.0 - 10.5 K/uL   RBC 4.65 3.87 - 5.11 MIL/uL   Hemoglobin 12.8 12.0 - 15.0 g/dL   HCT 41.0 36.0 - 46.0 %   MCV 88.2 80.0 - 100.0 fL   MCH 27.5 26.0 - 34.0 pg   MCHC 31.2 30.0 - 36.0 g/dL   RDW 15.8 (H) 11.5 - 15.5 %   Platelets 262 150 - 400 K/uL   nRBC 0.0 0.0 - 0.2 %   Neutrophils Relative % 83 %   Neutro Abs 7.9 (H) 1.7 - 7.7 K/uL   Lymphocytes Relative 11 %   Lymphs Abs 1.1 0.7 - 4.0 K/uL   Monocytes Relative 6 %   Monocytes Absolute 0.5 0.1 - 1.0 K/uL   Eosinophils Relative 0 %   Eosinophils Absolute 0.0 0.0 - 0.5 K/uL   Basophils Relative 0 %   Basophils Absolute 0.0 0.0 - 0.1 K/uL    Immature Granulocytes 0 %   Abs Immature Granulocytes 0.03 0.00 - 0.07 K/uL    Comment: Performed at [facility], [place] [address], [place], [place] [address]  CBC with Differential     Status: Abnormal   Collection Time: 11/07/22  5:03 AM  Result Value Ref Range   WBC 8.8 4.0 - 10.5 K/uL   RBC 4.37 3.87 - 5.11 MIL/uL   Hemoglobin 11.9 (L) 12.0 - 15.0 g/dL   HCT 39.4 36.0 - 46.0 %   MCV 90.2 80.0 - 100.0 fL   MCH 27.2 26.0 - 34.0 pg   MCHC 30.2 30.0 - 36.0 g/dL   RDW 15.8 (H) 11.5 - 15.5 %   Platelets 136 (L) 150 - 400 K/uL   nRBC 0.0 0.0 - 0.2 %   Neutrophils Relative % 61 %   Neutro Abs 5.4 1.7 - 7.7 K/uL   Lymphocytes Relative 31 %   Lymphs Abs 2.7 0.7 - 4.0 K/uL   Monocytes Relative 7 %   Monocytes Absolute 0.6 0.1 - 1.0 K/uL   Eosinophils Relative 0 %   Eosinophils Absolute 0.0 0.0 - 0.5 K/uL   Basophils Relative 1 %   Basophils Absolute 0.1 0.0 - 0.1 K/uL   Immature Granulocytes 0 %   Abs Immature Granulocytes 0.03 0.00 - 0.07 K/uL    Comment: Performed at [facility], [place] [address], [place], [place] [address]  Lipase, blood     Status: None   Collection Time: 11/22/22 11:43 AM  Result Value Ref Range   Lipase 40 11 - 51 U/L    Comment: Performed at [facility], [place] [address] [place], [place] [address]  Comprehensive metabolic panel     Status: Abnormal   Collection Time: 11/22/22 11:43 AM  Result Value Ref Range   Sodium 138 135 - 145 mmol/L   Potassium 4.0 3.5 - 5.1 mmol/L   Chloride 103 98 - 111 mmol/L   CO2 26 22 - 32 mmol/L   Glucose, Bld 105 (H) 70 - 99 mg/dL  Comment: Glucose reference range applies only to samples taken after fasting for at least 8 hours.   BUN 18 8 - 23 mg/dL   Creatinine, Ser 0.99 0.44 - 1.00 mg/dL   Calcium 9.1 8.9 - 10.3 mg/dL   Total Protein 8.2 (H) 6.5 - 8.1 g/dL   Albumin 3.8 3.5 - 5.0 g/dL   AST 21 15 - 41 U/L   ALT 23 0 - 44 U/L   Alkaline Phosphatase 65 38 - 126  U/L   Total Bilirubin 0.9 0.3 - 1.2 mg/dL   GFR, Estimated >60 >60 mL/min    Comment: (NOTE) Calculated using the CKD-EPI Creatinine Equation (2021)    Anion gap 9 5 - 15    Comment: Performed at [facility], [place] [address], [place], [place]  CBC     Status: None   Collection Time: 11/22/22 11:43 AM  Result Value Ref Range   WBC 10.4 4.0 - 10.5 K/uL   RBC 4.69 3.87 - 5.11 MIL/uL   Hemoglobin 12.8 12.0 - 15.0 g/dL   HCT 40.3 36.0 - 46.0 %   MCV 85.9 80.0 - 100.0 fL   MCH 27.3 26.0 - 34.0 pg   MCHC 31.8 30.0 - 36.0 g/dL   RDW 15.3 11.5 - 15.5 %   Platelets 371 150 - 400 K/uL   nRBC 0.0 0.0 - 0.2 %    Comment: Performed at [facility], [place] [address], [place], [place] [address]  Urinalysis, Routine w reflex microscopic Urine, Clean Catch     Status: Abnormal   Collection Time: 11/22/22 11:43 AM  Result Value Ref Range   Color, Urine AMBER (A) YELLOW    Comment: BIOCHEMICALS MAY BE AFFECTED BY COLOR   APPearance HAZY (A) CLEAR   Specific Gravity, Urine 1.023 1.005 - 1.030   pH 5.0 5.0 - 8.0   Glucose, UA NEGATIVE NEGATIVE mg/dL   Hgb urine dipstick NEGATIVE NEGATIVE   Bilirubin Urine NEGATIVE NEGATIVE   Ketones, ur 5 (A) NEGATIVE mg/dL   Protein, ur NEGATIVE NEGATIVE mg/dL   Nitrite NEGATIVE NEGATIVE   Leukocytes,Ua MODERATE (A) NEGATIVE   RBC / HPF 0-5 0 - 5 RBC/hpf   WBC, UA 11-20 0 - 5 WBC/hpf   Bacteria, UA RARE (A) NONE SEEN   Squamous Epithelial / HPF 0-5 0 - 5   Mucus PRESENT    Hyaline Casts, UA PRESENT     Comment: Performed at [facility], [place] [address] [place], [place] [address]  CBC     Status: Abnormal   Collection Time: 11/23/22  2:46 AM  Result Value Ref Range   WBC 9.6 4.0 - 10.5 K/uL   RBC 4.21 3.87 - 5.11 MIL/uL   Hemoglobin 11.8 (L) 12.0 - 15.0 g/dL   HCT 36.9 36.0 - 46.0 %   MCV 87.6 80.0 - 100.0 fL   MCH 28.0 26.0 - 34.0 pg   MCHC 32.0 30.0 - 36.0 g/dL   RDW 15.3 11.5 - 15.5  %   Platelets 272 150 - 400 K/uL   nRBC 0.0 0.0 - 0.2 %    Comment: Performed at [facility], [place] [address], [place], [place] [address]  Heparin level (unfractionated)     Status: Abnormal   Collection Time: 11/23/22  2:46 AM  Result Value Ref Range   Heparin Unfractionated 0.27 (L) 0.30 - 0.70 IU/mL    Comment: (NOTE) The clinical reportable range upper limit is being  lowered to >1.10 to align with the FDA approved guidance for the current laboratory assay.  If heparin results are below expected values, and patient dosage has  been confirmed, suggest follow up testing of antithrombin III levels. Performed at [facility], [place] [address] [place], [place] [address]   Comprehensive metabolic panel     Status: Abnormal   Collection Time: 11/23/22  7:32 AM  Result Value Ref Range   Sodium 139 135 - 145 mmol/L   Potassium 3.6 3.5 - 5.1 mmol/L   Chloride 107 98 - 111 mmol/L   CO2 24 22 - 32 mmol/L   Glucose, Bld 88 70 - 99 mg/dL    Comment: Glucose reference range applies only to samples taken after fasting for at least 8 hours.   BUN 10 8 - 23 mg/dL   Creatinine, Ser 0.81 0.44 - 1.00 mg/dL   Calcium 8.5 (L) 8.9 - 10.3 mg/dL   Total Protein 6.5 6.5 - 8.1 g/dL   Albumin 3.0 (L) 3.5 - 5.0 g/dL   AST 16 15 - 41 U/L   ALT 19 0 - 44 U/L   Alkaline Phosphatase 57 38 - 126 U/L   Total Bilirubin 0.8 0.3 - 1.2 mg/dL   GFR, Estimated >60 >60 mL/min    Comment: (NOTE) Calculated using the CKD-EPI Creatinine Equation (2021)    Anion gap 8 5 - 15    Comment: Performed at [facility], [place] [address] [place], [place] [address]  HIV Antibody (routine testing w rflx)     Status: None   Collection Time: 11/23/22  7:32 AM  Result Value Ref Range   HIV Screen 4th Generation wRfx Non Reactive Non Reactive    Comment: Performed at [facility], [place] [address] [place], [place] [address]  Heparin level (unfractionated)     Status:  None   Collection Time: 11/23/22 12:04 PM  Result Value Ref Range   Heparin Unfractionated 0.37 0.30 - 0.70 IU/mL    Comment: (NOTE) The clinical reportable range upper limit is being lowered to >1.10 to align with the FDA approved guidance for the current laboratory assay.  If heparin results are below expected values, and patient dosage has  been confirmed, suggest follow up testing of antithrombin III levels. Performed at [facility], [place] [address], [place], [place] [address]   Heparin level (unfractionated)     Status: None   Collection Time: 11/23/22  7:26 PM  Result Value Ref Range   Heparin Unfractionated 0.46 0.30 - 0.70 IU/mL    Comment: (NOTE) The clinical reportable range upper limit is being lowered to >1.10 to align with the FDA approved guidance for the current laboratory assay.  If heparin results are below expected values, and patient dosage has  been confirmed, suggest follow up testing of antithrombin III levels. Performed at [facility], [place] [address], [place], [place] [address]   CBC     Status: Abnormal   Collection Time: 11/24/22  6:34 AM  Result Value Ref Range   WBC 7.9 4.0 - 10.5 K/uL   RBC 3.92 3.87 - 5.11 MIL/uL   Hemoglobin 10.9 (L) 12.0 - 15.0 g/dL   HCT 35.1 (L) 36.0 - 46.0 %   MCV 89.5 80.0 - 100.0 fL   MCH 27.8 26.0 - 34.0 pg   MCHC 31.1 30.0 - 36.0 g/dL   RDW 15.1 11.5 - 15.5 %   Platelets 304 150 - 400  K/uL   nRBC 0.0 0.0 - 0.2 %    Comment: Performed at [facility], [place] [address] [place], [place] [id number]  Basic metabolic panel     Status: Abnormal   Collection Time: 11/24/22  6:34 AM  Result Value Ref Range   Sodium 137 135 - 145 mmol/L   Potassium 3.5 3.5 - 5.1 mmol/L   Chloride 107 98 - 111 mmol/L   CO2 26 22 - 32 mmol/L   Glucose, Bld 84 70 - 99 mg/dL    Comment: Glucose reference range applies only to samples taken after fasting for at least 8 hours.   BUN 6 (L)  8 - 23 mg/dL   Creatinine, Ser 0.76 0.44 - 1.00 mg/dL   Calcium 8.1 (L) 8.9 - 10.3 mg/dL   GFR, Estimated >60 >60 mL/min    Comment: (NOTE) Calculated using the CKD-EPI Creatinine Equation (2021)    Anion gap 4 (L) 5 - 15    Comment: Performed at [facility], [place] [address] [place], [place]  Magnesium     Status: None   Collection Time: 11/24/22  6:34 AM  Result Value Ref Range   Magnesium 2.2 1.7 - 2.4 mg/dL    Comment: Performed at [facility], [place] [address], [place], [place] [address]  Heparin level (unfractionated)     Status: None   Collection Time: 11/24/22  6:34 AM  Result Value Ref Range   Heparin Unfractionated 0.31 0.30 - 0.70 IU/mL    Comment: (NOTE) The clinical reportable range upper limit is being lowered to >1.10 to align with the FDA approved guidance for the current laboratory assay.  If heparin results are below expected values, and patient dosage has  been confirmed, suggest follow up testing of antithrombin III levels. Performed at [facility], [place] [address], [place], [place] [address]   CBC     Status: Abnormal   Collection Time: 11/25/22  7:49 AM  Result Value Ref Range   WBC 7.1 4.0 - 10.5 K/uL   RBC 3.81 (L) 3.87 - 5.11 MIL/uL   Hemoglobin 10.3 (L) 12.0 - 15.0 g/dL   HCT 32.8 (L) 36.0 - 46.0 %   MCV 86.1 80.0 - 100.0 fL   MCH 27.0 26.0 - 34.0 pg   MCHC 31.4 30.0 - 36.0 g/dL   RDW 15.0 11.5 - 15.5 %   Platelets 283 150 - 400 K/uL   nRBC 0.0 0.0 - 0.2 %    Comment: Performed at [facility], [place] [address] [place], [place] [address]  Heparin level (unfractionated)     Status: None   Collection Time: 11/25/22  7:49 AM  Result Value Ref Range   Heparin Unfractionated 0.34 0.30 - 0.70 IU/mL    Comment: (NOTE) The clinical reportable range upper limit is being lowered to >1.10 to align with the FDA approved guidance for the current laboratory assay.  If  heparin results are below expected values, and patient dosage has  been confirmed, suggest follow up testing of antithrombin III levels. Performed at [facility], [place] [address] [place], [place] [address]   CBC     Status: Abnormal   Collection Time: 11/26/22  6:08 AM  Result Value Ref Range   WBC 6.6 4.0 - 10.5 K/uL   RBC 3.68 (L) 3.87 - 5.11 MIL/uL   Hemoglobin 10.1 (L) 12.0 - 15.0 g/dL   HCT 31.8 (L) 36.0 - 46.0 %  MCV 86.4 80.0 - 100.0 fL   MCH 27.4 26.0 - 34.0 pg   MCHC 31.8 30.0 - 36.0 g/dL   RDW 14.9 11.5 - 15.5 %   Platelets 297 150 - 400 K/uL   nRBC 0.0 0.0 - 0.2 %    Comment: Performed at [facility], [place] [address], [place], [place] [address]  Heparin level (unfractionated)     Status: None   Collection Time: 11/26/22  6:08 AM  Result Value Ref Range   Heparin Unfractionated 0.30 0.30 - 0.70 IU/mL    Comment: (NOTE) The clinical reportable range upper limit is being lowered to >1.10 to align with the FDA approved guidance for the current laboratory assay.  If heparin results are below expected values, and patient dosage has  been confirmed, suggest follow up testing of antithrombin III levels. Performed at [facility], [place] [address], [place], [place] [address]   Comprehensive metabolic panel     Status: Abnormal   Collection Time: 11/26/22  6:08 AM  Result Value Ref Range   Sodium 141 135 - 145 mmol/L   Potassium 3.5 3.5 - 5.1 mmol/L   Chloride 107 98 - 111 mmol/L   CO2 27 22 - 32 mmol/L   Glucose, Bld 89 70 - 99 mg/dL    Comment: Glucose reference range applies only to samples taken after fasting for at least 8 hours.   BUN <5 (L) 8 - 23 mg/dL   Creatinine, Ser 0.75 0.44 - 1.00 mg/dL   Calcium 8.9 8.9 - 10.3 mg/dL   Total Protein 6.2 (L) 6.5 - 8.1 g/dL   Albumin 2.8 (L) 3.5 - 5.0 g/dL   AST 23 15 - 41 U/L   ALT 23 0 - 44 U/L   Alkaline Phosphatase 60 38 - 126 U/L   Total Bilirubin 0.8 0.3 -  1.2 mg/dL   GFR, Estimated >60 >60 mL/min    Comment: (NOTE) Calculated using the CKD-EPI Creatinine Equation (2021)    Anion gap 7 5 - 15    Comment: Performed at [facility], [place] [address] [place], [place] [address]    RADIOGRAPHIC STUDIES: I have personally reviewed the radiological images as listed and agreed with the findings in the report. No results found.   ASSESSMENT & PLAN:  1.  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  Vascular surgeon felt no need for intervention.  Patient was treated with IV heparin while hospitalized and discharged with prescription for Lovenox 120 mg SQ twice daily for at least 3 months. - *** - DISCUSSION: Acute mesenteric vein thrombosis has been reported as postoperative complication of laparoscopic sleeve gastrectomy with incidence of about 0.7%.  It is imperative to identify patients with hypercoagulable conditions and treat them with long-term anticoagulation therapy.  Treatment for at least 3 to 6 months is recommended, but longer duration warranted if thrombophilic state is identified.  Study showed the following anticoagulation, most thrombosed veins partially or completely recanalized. - PLAN: Hypercoagulable workup with labs TODAY including factor V Leiden, PT gene mutation, anticardiolipin antibodies, beta-2 glycoprotein antibodies, lupus anticoagulant panel, D-dimer.*** -  Will call patient to discuss results via telephone *** - If hypercoagulable workup is negative, recommend repeat CT angiography of abdomen/pelvis in 3 to 6 months.   If SMV/portal vein thrombus has resolved at that time, would consider possible discontinuation of anticoagulation.  ***  ***.  Other history - ***   PLAN SUMMARY: >> *** >> *** >> ***   All questions were answered. The patient knows to call the clinic with any problems, questions or concerns.  Medical decision making: ***  Time spent on visit: I spent *** minutes counseling the patient face to face. The total time spent in the appointment was *** minutes and more than 50% was on counseling.  I, Tarri Abernethy PA-C, have seen this patient in conjunction with Dr. Derek Jack.  Greater than 50% of visit was performed by Dr. Delton Coombes.   Harriett Rush, PA-C *** ***  DR. Delton CoombesMarland Kitchen

## 2023-01-13 ENCOUNTER — Other Ambulatory Visit (HOSPITAL_COMMUNITY)
Admission: RE | Admit: 2023-01-13 | Discharge: 2023-01-13 | Disposition: A | Discharge status: Home / Self Care | Payer: 59 | Source: Ambulatory Visit | Attending: Hematology | Admitting: Hematology

## 2023-01-13 ENCOUNTER — Telehealth: Payer: Self-pay | Admitting: Orthopaedic Surgery

## 2023-01-13 ENCOUNTER — Inpatient Hospital Stay: Payer: 59 | Attending: Hematology | Admitting: Hematology

## 2023-01-13 ENCOUNTER — Inpatient Hospital Stay: Payer: 59

## 2023-01-13 VITALS — BP 113/63 | HR 72 | Temp 98.2°F | Resp 1 | Ht 67.0 in | Wt 242.4 lb

## 2023-01-13 DIAGNOSIS — K55069 Acute infarction of intestine, part and extent unspecified: Secondary | ICD-10-CM

## 2023-01-13 DIAGNOSIS — I81 Portal vein thrombosis: Secondary | ICD-10-CM | POA: Insufficient documentation

## 2023-01-13 DIAGNOSIS — Z7901 Long term (current) use of anticoagulants: Secondary | ICD-10-CM

## 2023-01-13 DIAGNOSIS — I10 Essential (primary) hypertension: Secondary | ICD-10-CM | POA: Diagnosis not present

## 2023-01-13 DIAGNOSIS — K55059 Acute (reversible) ischemia of intestine, part and extent unspecified: Secondary | ICD-10-CM | POA: Diagnosis not present

## 2023-01-13 LAB — COMPREHENSIVE METABOLIC PANEL
ALT: 37 U/L (ref 0–44)
AST: 34 U/L (ref 15–41)
Albumin: 3.6 g/dL (ref 3.5–5.0)
Alkaline Phosphatase: 82 U/L (ref 38–126)
Anion gap: 9 (ref 5–15)
BUN: 16 mg/dL (ref 8–23)
CO2: 25 mmol/L (ref 22–32)
Calcium: 9.1 mg/dL (ref 8.9–10.3)
Chloride: 106 mmol/L (ref 98–111)
Creatinine, Ser: 0.94 mg/dL (ref 0.44–1.00)
GFR, Estimated: >60 mL/min (ref >60)
Glucose, Bld: 87 mg/dL (ref 70–99)
Potassium: 3.6 mmol/L (ref 3.5–5.1)
Sodium: 140 mmol/L (ref 135–145)
Total Bilirubin: 1.2 mg/dL (ref 0.3–1.2)
Total Protein: 7 g/dL (ref 6.5–8.1)

## 2023-01-13 LAB — CBC WITH DIFFERENTIAL/PLATELET
Abs Immature Granulocytes: 0.02 10*3/uL (ref 0.00–0.07)
Basophils Absolute: 0.1 10*3/uL (ref 0.0–0.1)
Basophils Relative: 1 %
Eosinophils Absolute: 0.2 10*3/uL (ref 0.0–0.5)
Eosinophils Relative: 3 %
HCT: 39.9 % (ref 36.0–46.0)
Hemoglobin: 12.9 g/dL (ref 12.0–15.0)
Immature Granulocytes: 0 %
Lymphocytes Relative: 31 %
Lymphs Abs: 2.7 10*3/uL (ref 0.7–4.0)
MCH: 28 pg (ref 26.0–34.0)
MCHC: 32.3 g/dL (ref 30.0–36.0)
MCV: 86.6 fL (ref 80.0–100.0)
Monocytes Absolute: 0.8 10*3/uL (ref 0.1–1.0)
Monocytes Relative: 10 %
Neutro Abs: 4.6 10*3/uL (ref 1.7–7.7)
Neutrophils Relative %: 55 %
Platelets: 305 10*3/uL (ref 150–400)
RBC: 4.61 MIL/uL (ref 3.87–5.11)
RDW: 17.3 % — ABNORMAL HIGH (ref 11.5–15.5)
WBC: 8.4 10*3/uL (ref 4.0–10.5)
nRBC: 0 % (ref 0.0–0.2)

## 2023-01-13 LAB — D-DIMER, QUANTITATIVE: D-Dimer, Quant: 0.58 ug/mL-FEU — ABNORMAL HIGH (ref 0.00–0.50)

## 2023-01-13 MED ORDER — APIXABAN 5 MG PO TABS: 5.0000 mg | ORAL_TABLET | Freq: Two times a day (BID) | ORAL | 1 refills | Status: DC

## 2023-01-13 NOTE — Telephone Encounter (Signed)
Patient presented to the office and stated that she was just put on he Heparin for 3 months.  Discussed with Abby, cancelling all three injections.  Please advise the patient when she should consider rescheduling.  Since she can't have injections right now, she is wanting to know if she can get something for pain.  [phone]

## 2023-01-13 NOTE — Patient Instructions (Signed)
[facility] **   You were seen today by Dr. Delton Coombes & Tarri Abernethy PA-C for your abdominal vein blood clot.    ABDOMINAL VEIN BLOOD CLOT: The blood clots in your superior mesenteric (SMV) vein and portal vein were likely provoked by your stomach surgery (laparoscopic sleeve gastrectomy). You will need to be on blood thinners for 3 to 6 months. We will also need to check labs TODAY to make sure you do not have any evidence of inherited blood clotting disorders. We will check CT scan of your abdomen in 3 months to see if your blood clots have resolved. We will see you for follow-up visit in 3 months (1 week after CT scan) to discuss results and next steps.  **Do NOT stop taking your blood thinner until you have seen [place] for follow-up visit.  **Prescription sent to your pharmacy for Eliquis 5 mg, which should be taken twice daily.  Please call our office IMMEDIATELY if you are unable to swallow this pill or are vomiting it back up.  If that happens, we will need to place you back on blood thinner injections (Lovenox).  **Seek IMMEDIATE medical attention if you notice any evidence of bleeding such as severe nosebleeds, bright red blood in the toilet, or black tarry bowel movements.  ** Thank you for trusting me with your healthcare!  I strive to provide all of my patients with quality care at each visit.  If you receive a survey for this visit, I would be so grateful to you for taking the time to provide feedback.  Thank you in advance!  ~ Jentry Warnell                   Dr. Derek Jack   &   Tarri Abernethy, PA-C   - - - - - - - - - - - - - - - - - -    Thank you for choosing [place] at [facility] to provide your oncology and hematology care.  To afford each patient quality time with our provider, please arrive at least 15 minutes before your scheduled appointment time.   If  you have a lab appointment with the [facility] please come in thru the Main Entrance and check in at the main information desk.  You need to re-schedule your appointment should you arrive 10 or more minutes late.  We strive to give you quality time with our providers, and arriving late affects you and other patients whose appointments are after yours.  Also, if you no show three or more times for appointments you may be dismissed from the clinic at the providers discretion.     Again, thank you for choosing [facility].  Our hope is that these requests will decrease the amount of time that you wait before being seen by our physicians.       _____________________________________________________________  Should you have questions after your visit to [facility], please contact our office at [phone] and follow the prompts.  Our office hours are 8:00 a.m. and 4:30 p.m. Monday - Friday.  Please note that voicemails left after 4:00 p.m. may not be returned until the following business day.  We are closed weekends and major holidays.  You do have access to a nurse 24-7, just call the main number to the clinic [phone] and do not press any options, hold  on the line and a nurse will answer the phone.    For prescription refill requests, have your pharmacy contact our office and allow 72 hours.

## 2023-01-14 ENCOUNTER — Ambulatory Visit (INDEPENDENT_AMBULATORY_CARE_PROVIDER_SITE_OTHER): Payer: 59 | Admitting: Orthopaedic Surgery

## 2023-01-14 ENCOUNTER — Other Ambulatory Visit: Payer: Self-pay

## 2023-01-14 ENCOUNTER — Encounter: Payer: Self-pay | Admitting: Orthopaedic Surgery

## 2023-01-14 ENCOUNTER — Ambulatory Visit: Payer: Medicare Other | Admitting: Orthopaedic Surgery

## 2023-01-14 DIAGNOSIS — Z7901 Long term (current) use of anticoagulants: Secondary | ICD-10-CM

## 2023-01-14 DIAGNOSIS — K55069 Acute infarction of intestine, part and extent unspecified: Secondary | ICD-10-CM

## 2023-01-14 DIAGNOSIS — I81 Portal vein thrombosis: Secondary | ICD-10-CM

## 2023-01-14 DIAGNOSIS — M170 Bilateral primary osteoarthritis of knee: Secondary | ICD-10-CM

## 2023-01-14 NOTE — Telephone Encounter (Signed)
Please advise 

## 2023-01-14 NOTE — Progress Notes (Signed)
This patient is diagnosed with osteoarthritis of the knee(s).    Radiographs show evidence of joint space narrowing, osteophytes, subchondral sclerosis and/or subchondral cysts.  This patient has knee pain which interferes with functional and activities of daily living.    This patient has experienced inadequate response, adverse effects and/or intolerance with conservative treatments such as acetaminophen, NSAIDS, topical creams, physical therapy or regular exercise, knee bracing and/or weight loss.   This patient has experienced inadequate response or has a contraindication to intra articular steroid injections for at least 3 months.   This patient is not scheduled to have a total knee replacement within 6 months of starting treatment with viscosupplementation.  PROCEDURE NOTE:  Synvisc injection #  1 of 3   Injection 1 vial of Synvisc into right  knee  There were no vitals taken for this visit.  The right knee exam: there was no synovitis or infection   The knee was prepped sterilely  Ethyl chloride was used to anesthetize the skin A 20 g needle was used to inject the knee with 1 vial of Synvisc A sterile dressing was placed  There were no complications  Follow up one week  PROCEDURE NOTE:  Synvisc injection #  1 of 3   Injection 1 vial of Synvisc into left  knee  There were no vitals taken for this visit.  The left knee exam: there was no synovitis or infection   The knee was prepped sterilely  Ethyl chloride was used to anesthetize the skin A 20 g needle was used to inject the knee with 1 vial of Synvisc A sterile dressing was placed  There were no complications  Follow up one week  Call if any problem.  Precautions discussed.  Electronically Signed Sanjuana Kava, MD 2/13/20242:11 PM

## 2023-01-15 DIAGNOSIS — R519 Headache, unspecified: Secondary | ICD-10-CM | POA: Diagnosis not present

## 2023-01-15 DIAGNOSIS — M5451 Vertebrogenic low back pain: Secondary | ICD-10-CM | POA: Diagnosis not present

## 2023-01-15 DIAGNOSIS — M170 Bilateral primary osteoarthritis of knee: Secondary | ICD-10-CM | POA: Diagnosis not present

## 2023-01-15 LAB — LUPUS ANTICOAGULANT PANEL
DRVVT: 35.9 s (ref 0.0–47.0)
PTT Lupus Anticoagulant: 38 s (ref 0.0–43.5)

## 2023-01-15 LAB — CARDIOLIPIN ANTIBODIES, IGG, IGM, IGA
Anticardiolipin IgA: <9 APL U/mL (ref 0–11)
Anticardiolipin IgG: <9 GPL U/mL (ref 0–14)
Anticardiolipin IgM: 10 MPL U/mL (ref 0–12)

## 2023-01-16 LAB — BETA-2-GLYCOPROTEIN I ABS, IGG/M/A
Beta-2 Glyco I IgG: <9 GPI IgG units (ref 0–20)
Beta-2-Glycoprotein I IgA: <9 GPI IgA units (ref 0–25)
Beta-2-Glycoprotein I IgM: <9 GPI IgM units (ref 0–32)

## 2023-01-19 LAB — JAK2 GENOTYPR

## 2023-01-20 ENCOUNTER — Encounter: Payer: Medicare Other | Admitting: Hematology

## 2023-01-21 ENCOUNTER — Ambulatory Visit: Payer: Medicare Other | Admitting: Orthopaedic Surgery

## 2023-01-21 ENCOUNTER — Encounter: Payer: Self-pay | Admitting: Orthopaedic Surgery

## 2023-01-21 ENCOUNTER — Ambulatory Visit (INDEPENDENT_AMBULATORY_CARE_PROVIDER_SITE_OTHER): Payer: 59 | Admitting: Orthopaedic Surgery

## 2023-01-21 DIAGNOSIS — M170 Bilateral primary osteoarthritis of knee: Secondary | ICD-10-CM | POA: Diagnosis not present

## 2023-01-21 MED ORDER — TRAMADOL HCL 50 MG PO TABS: 50.0000 mg | ORAL_TABLET | Freq: Four times a day (QID) | ORAL | 0 refills | Status: AC | PRN

## 2023-01-21 NOTE — Progress Notes (Signed)
This patient is diagnosed with osteoarthritis of the knee(s).    Radiographs show evidence of joint space narrowing, osteophytes, subchondral sclerosis and/or subchondral cysts.  This patient has knee pain which interferes with functional and activities of daily living.    This patient has experienced inadequate response, adverse effects and/or intolerance with conservative treatments such as acetaminophen, NSAIDS, topical creams, physical therapy or regular exercise, knee bracing and/or weight loss.   This patient has experienced inadequate response or has a contraindication to intra articular steroid injections for at least 3 months.   This patient is not scheduled to have a total knee replacement within 6 months of starting treatment with viscosupplementation.  PROCEDURE NOTE:  Synvisc injection #  2 of 3   Injection 1 vial of Synvisc into right  knee  There were no vitals taken for this visit.  The right knee exam: there was no synovitis or infection   The knee was prepped sterilely  Ethyl chloride was used to anesthetize the skin A 20 g needle was used to inject the knee with 1 vial of Synvisc A sterile dressing was placed  There were no complications  Follow up one week  PROCEDURE NOTE:  Synvisc injection #  2 of 3   Injection 1 vial of Synvisc into left  knee  There were no vitals taken for this visit.  The left knee exam: there was no synovitis or infection   The knee was prepped sterilely  Ethyl chloride was used to anesthetize the skin A 20 g needle was used to inject the knee with 1 vial of Synvisc A sterile dressing was placed  There were no complications  Follow up one week  Call if any problem.  Precautions discussed.  Electronically Signed Sanjuana Kava, MD 2/20/20241:41 PM

## 2023-01-22 LAB — FACTOR 5 LEIDEN

## 2023-01-23 DIAGNOSIS — M47816 Spondylosis without myelopathy or radiculopathy, lumbar region: Secondary | ICD-10-CM | POA: Diagnosis not present

## 2023-01-23 DIAGNOSIS — M47817 Spondylosis without myelopathy or radiculopathy, lumbosacral region: Secondary | ICD-10-CM | POA: Diagnosis not present

## 2023-01-24 LAB — PROTHROMBIN GENE MUTATION

## 2023-01-28 ENCOUNTER — Encounter: Payer: Self-pay | Admitting: Orthopaedic Surgery

## 2023-01-28 ENCOUNTER — Ambulatory Visit: Payer: Medicare Other | Admitting: Orthopaedic Surgery

## 2023-01-28 ENCOUNTER — Ambulatory Visit (INDEPENDENT_AMBULATORY_CARE_PROVIDER_SITE_OTHER): Payer: 59 | Admitting: Orthopaedic Surgery

## 2023-01-28 DIAGNOSIS — M170 Bilateral primary osteoarthritis of knee: Secondary | ICD-10-CM

## 2023-01-28 NOTE — Progress Notes (Signed)
This patient is diagnosed with osteoarthritis of the knee(s).    Radiographs show evidence of joint space narrowing, osteophytes, subchondral sclerosis and/or subchondral cysts.  This patient has knee pain which interferes with functional and activities of daily living.    This patient has experienced inadequate response, adverse effects and/or intolerance with conservative treatments such as acetaminophen, NSAIDS, topical creams, physical therapy or regular exercise, knee bracing and/or weight loss.   This patient has experienced inadequate response or has a contraindication to intra articular steroid injections for at least 3 months.   This patient is not scheduled to have a total knee replacement within 6 months of starting treatment with viscosupplementation.  PROCEDURE NOTE:  Synvisc injection #  3 of 3   Injection 1 vial of Synvisc into bilateral  knees  There were no vitals taken for this visit.  The bilateral knee exam: there was no synovitis or infection   The knee was prepped sterilely  Ethyl chloride was used to anesthetize the skin A 20 g needle was used to inject the knee with 1 vial of Synvisc A sterile dressing was placed  There were no complications  Follow up one month  Call if any problem.  Precautions discussed.  Electronically Signed Sanjuana Kava, MD 2/27/20241:35 PM

## 2023-01-29 DIAGNOSIS — R5383 Other fatigue: Secondary | ICD-10-CM | POA: Diagnosis not present

## 2023-01-29 DIAGNOSIS — Z9884 Bariatric surgery status: Secondary | ICD-10-CM | POA: Diagnosis not present

## 2023-01-29 DIAGNOSIS — E569 Vitamin deficiency, unspecified: Secondary | ICD-10-CM | POA: Diagnosis not present

## 2023-01-29 DIAGNOSIS — K912 Postsurgical malabsorption, not elsewhere classified: Secondary | ICD-10-CM | POA: Diagnosis not present

## 2023-01-30 ENCOUNTER — Encounter: Payer: Self-pay | Admitting: Radiology

## 2023-01-31 DIAGNOSIS — M546 Pain in thoracic spine: Secondary | ICD-10-CM | POA: Diagnosis not present

## 2023-01-31 DIAGNOSIS — M9901 Segmental and somatic dysfunction of cervical region: Secondary | ICD-10-CM | POA: Diagnosis not present

## 2023-01-31 DIAGNOSIS — M9903 Segmental and somatic dysfunction of lumbar region: Secondary | ICD-10-CM | POA: Diagnosis not present

## 2023-01-31 DIAGNOSIS — M9902 Segmental and somatic dysfunction of thoracic region: Secondary | ICD-10-CM | POA: Diagnosis not present

## 2023-01-31 DIAGNOSIS — M62830 Muscle spasm of back: Secondary | ICD-10-CM | POA: Diagnosis not present

## 2023-02-06 ENCOUNTER — Ambulatory Visit (INDEPENDENT_AMBULATORY_CARE_PROVIDER_SITE_OTHER): Payer: 59 | Admitting: Podiatry

## 2023-02-06 ENCOUNTER — Encounter: Payer: Self-pay | Admitting: Neurology

## 2023-02-06 ENCOUNTER — Ambulatory Visit (INDEPENDENT_AMBULATORY_CARE_PROVIDER_SITE_OTHER): Payer: 59 | Admitting: Neurology

## 2023-02-06 ENCOUNTER — Telehealth: Payer: Self-pay

## 2023-02-06 ENCOUNTER — Ambulatory Visit (INDEPENDENT_AMBULATORY_CARE_PROVIDER_SITE_OTHER): Payer: 59

## 2023-02-06 VITALS — BP 130/75 | HR 72 | Ht 67.0 in | Wt 242.5 lb

## 2023-02-06 DIAGNOSIS — M79675 Pain in left toe(s): Secondary | ICD-10-CM

## 2023-02-06 DIAGNOSIS — R42 Dizziness and giddiness: Secondary | ICD-10-CM | POA: Diagnosis not present

## 2023-02-06 DIAGNOSIS — T1490XA Injury, unspecified, initial encounter: Secondary | ICD-10-CM | POA: Diagnosis not present

## 2023-02-06 DIAGNOSIS — G43709 Chronic migraine without aura, not intractable, without status migrainosus: Secondary | ICD-10-CM | POA: Diagnosis not present

## 2023-02-06 DIAGNOSIS — M2062 Acquired deformities of toe(s), unspecified, left foot: Secondary | ICD-10-CM | POA: Diagnosis not present

## 2023-02-06 MED ORDER — AIMOVIG 140 MG/ML ~~LOC~~ SOAJ: 140.0000 mg | SUBCUTANEOUS | 11 refills | Status: AC

## 2023-02-06 MED ORDER — UBRELVY 50 MG PO TABS: ORAL_TABLET | ORAL | 6 refills | Status: DC

## 2023-02-06 NOTE — Telephone Encounter (Signed)
Received surgery paperwork from Dr. Jacqualyn Posey. Left a message for Wealtha to call and schedule surgery.

## 2023-02-06 NOTE — Progress Notes (Signed)
Subjective:   Patient ID: Kathryn Compton, female   DOB: 72 y.o.   MRN: [id number]   HPI Chief Complaint  Patient presents with   Foot Pain    Pain located in the left foot, 2nd digit, injury was 5 months ago,     72 year old female presents the office today with above concerns.  She states that she had an injury previously to the second toe about 5 months ago when her toe went under an area of cement and her toes bent back and she went in her knees.  This [place].  Since then she has noticed her toe turning.  On Eliquis for clot in portal vein-currently being seen by hematology   Review of Systems  All other systems reviewed and are negative.  Past Medical History:  Diagnosis Date   Anxiety    Arthritis    Depression    GERD (gastroesophageal reflux disease)    Headache    Hypertension    Motion sickness    Palpitations    Plantar fasciitis    Seizures ([place])    Vertigo     Past Surgical History:  Procedure Laterality Date   BREAST REDUCTION SURGERY     COLONOSCOPY N/A 09/06/2019   Procedure: COLONOSCOPY;  Surgeon: Danie Binder, MD;  Location: [facility];  Service: Endoscopy;  Laterality: N/A;  2:00   LAPAROSCOPIC GASTRIC SLEEVE RESECTION N/A 11/05/2022   Procedure: LAPAROSCOPIC SLEEVE GASTRECTOMY;  Surgeon: Kieth Brightly Arta Bruce, MD;  Location: [facility];  Service: General;  Laterality: N/A;   POLYPECTOMY  09/06/2019   Procedure: POLYPECTOMY;  Surgeon: Danie Binder, MD;  Location: [facility];  Service: Endoscopy;;   REDUCTION MAMMAPLASTY     TUBAL LIGATION     UPPER GI ENDOSCOPY N/A 11/05/2022   Procedure: UPPER GI ENDOSCOPY;  Surgeon: Mickeal Skinner, MD;  Location: [facility];  Service: General;  Laterality: N/A;     Current Outpatient Medications:    apixaban (ELIQUIS) 5 MG TABS tablet, Take 1 tablet (5 mg total) by mouth 2 (two) times daily., Disp: 180 tablet, Rfl: 1   Cholecalciferol (VITAMIN D3) 50 MCG (2000 UT) TABS, Take 2,000  Units by mouth daily., Disp: , Rfl:    citalopram (CELEXA) 20 MG tablet, Take 20 mg by mouth daily as needed (for unresolved depression)., Disp: , Rfl:    citalopram (CELEXA) 40 MG tablet, Take 40 mg by mouth daily., Disp: , Rfl:    COLLAGEN PO, Take 1 Scoop by mouth daily. Mix and drink as directed, Disp: , Rfl:    docusate sodium (COLACE) 100 MG capsule, Take 100 mg by mouth in the morning. (Patient not taking: Reported on 02/06/2023), Disp: , Rfl:    fluticasone (CUTIVATE) 0.05 % cream, Apply 1 application  topically daily as needed (for rashes)., Disp: , Rfl:    hydrOXYzine (ATARAX) 25 MG tablet, Take 6.25-12.5 mg by mouth every 6 (six) hours as needed for anxiety (or sleep)., Disp: , Rfl:    ketotifen (ZADITOR) 0.035 % ophthalmic solution, Place 1 drop into both eyes 2 (two) times daily as needed (itching)., Disp: , Rfl:    Multiple Vitamins-Minerals (BARIATRIC FUSION) CHEW, Chew 1 tablet by mouth daily., Disp: , Rfl:    ondansetron (ZOFRAN-ODT) 4 MG disintegrating tablet, Take 1 tablet (4 mg total) by mouth every 6 (six) hours as needed for nausea or vomiting. (Patient not taking: Reported on 02/06/2023), Disp: 20 tablet, Rfl: 0   pantoprazole (  PROTONIX) 40 MG tablet, Take 1 tablet (40 mg total) by mouth daily., Disp: 90 tablet, Rfl: 0   propranolol (INDERAL) 40 MG tablet, Take 1 tablet (40 mg total) by mouth 2 (two) times daily. (Patient taking differently: Take 40 mg by mouth in the morning and at bedtime.), Disp: 60 tablet, Rfl: 6   TUMS ULTRA 1000 1000 MG chewable tablet, Chew 1,000 mg by mouth in the morning and at bedtime., Disp: , Rfl:    Erenumab-aooe (AIMOVIG) 140 MG/ML SOAJ, Inject 140 mg into the skin every 30 (thirty) days., Disp: 1 mL, Rfl: 11   Ubrogepant (UBRELVY) 50 MG TABS, Take 1-2 tablets 30 minutes prior to MRI, may repeat once as needed. Must have driver., Disp: 10 tablet, Rfl: 6 No current facility-administered medications for this visit.  Facility-Administered Medications  Ordered in Other Visits:    ondansetron (ZOFRAN-ODT) disintegrating tablet 4 mg, 4 mg, Oral, Q4H PRN **OR** ondansetron (ZOFRAN) 4 mg in sodium chloride 0.9 % 50 mL IVPB, 4 mg, Intravenous, Q4H PRN, Kinsinger, Arta Bruce, MD   sodium chloride 0.9 % bolus 2,000 mL, 2,000 mL, Intravenous, Once, Kinsinger, Arta Bruce, MD  Allergies  Allergen Reactions   Other Itching and Other (See Comments)    Antidepressants-Itching, Gaining Weight Tolerates citalopram           Objective:  Physical Exam  General: AAO x3, NAD  Dermatological: Skin is warm, dry and supple bilateral.  There are no open sores, no preulcerative lesions, no rash or signs of infection present.  Vascular: Dorsalis Pedis artery and Posterior Tibial artery pedal pulses are 2/4 bilateral with immedate capillary fill time. There is no pain with calf compression, swelling, warmth, erythema.   Neruologic: Grossly intact via light touch bilateral.  Musculoskeletal: Rigid hammertoe contracture present there is transverse plane deformity also noted the second toe and there is tenderness palpation along the PIPJ itself.  There is trace edema there is no erythema or warmth.  Gait: Unassisted, Nonantalgic.       Assessment:   Digital deformity left second toe     Plan:  -Treatment options discussed including all alternatives, risks, and complications -Etiology of symptoms were discussed -X-rays were obtained and reviewed with the patient.  3 views left foot were obtained.  No evidence of acute fracture.  Digital deformity noted the second toe.  Old fracture noted along the toe. -Given the rigidity of the toe we discussed both conservative as well as surgical treatment options.  After discussion she wants to proceed with surgical repair and I discussed with her PIPJ arthrodesis.  She wishes to proceed with this. -The incision placement as well as the postoperative course was discussed with the patient. I discussed risks of the  surgery which include, but not limited to, infection, bleeding, pain, swelling, need for further surgery, delayed or nonhealing, painful or ugly scar, numbness or sensation changes, over/under correction, recurrence, transfer lesions, further deformity, hardware failure, DVT/PE, loss of toe/foot. Patient understands these risks and wishes to proceed with surgery. The surgical consent was reviewed with the patient all 3 pages were signed. No promises or guarantees were given to the outcome of the procedure. All questions were answered to the best of my ability. Before the surgery the patient was encouraged to call the office if there is any further questions. The surgery will be [facility] at the [facility] on an outpatient basis. -She will need hematology clearance  Trula Slade DPM

## 2023-02-06 NOTE — Progress Notes (Signed)
Chief Complaint  Patient presents with   Follow-up    Rm 15. Patient reports having to be on heparin and took her monthly injection and had a 5 day long stretch. Patient is now on eliquis and has not had episode since but now reports daily light headaches. Patient wants to know if higher dose of injection is needed.       ASSESSMENT AND PLAN  Kathryn Compton is a 72 y.o. female   Long history of intermittent dizziness episodes with frequent occurrence,  Was previously treated as seizure, tried different antiepileptic medications outside neurologist without improvement  Continue have occasional occurrence,  Prolonged 72 hours video EEG showed multiple pushbutton event, but not correlate with EEG changes,  MRI of the brain was normal  She describes episode mostly happen when standing up, with sudden positional change,  Today's examination showed no orthostatic blood pressure change,  Suggest her increase water intake, moderate exercise  Chronic migraine  Already on preventive medications beta-blocker, SSRI,  Still has frequent headaches,  Since started aimovig 70 mg in November 2023, her headache is under much better control, still has occasionally prolonged breakthrough, will increase to 140 mg monthly  Not a good candidate for NSAIDs, 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  EEG on June 27 2020. 1.  This recording of the awake and drowsy state shows infrequent sharp wave activity involving the left anterior temporal region.  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  MRI brain w/wo in Nov 2021, was normal.  MRI of lumbar in May 2022, 1. Mild right  subarticular and foraminal narrowing at L5-S1 secondary to a rightward disc protrusion. 2. Mild disc bulging and facet hypertrophy at L2-3, L3-4, and L4-5 without significant stenosis at these levels.  MRI of right hip on May 22, 2021 1.  No acute osseous injury of the right hip. 2. Small right superior labral tear. 3. Small amount of fluid in the greater trochanteric bursa bilaterally. 4. Lower lumbar spine spondylosis.   Laboratory evaluation in March 2022, normal liver function test, TSH 2.5, CBC with hemoglobin of 13, A1C 5.4, triglycerides 230, LDL 104   MEDICAL HISTORY:  Kathryn Compton is a 72 year old female, seen in request by her primary care physician Dr. Legrand Rams, Brandon Melnick, for evaluation of possible seizure, she is accompanied by her significant other Heinz Knuckles at today's visit on June 20, 2021.  I reviewed and summarized the referring note.PMHx. HTN  She reported intermittent spells that has similar semiology since age 18s, she describes episode as starting off at the back of her neck, then spreading to left occipital region, bilateral shoulder, she felt the pressure building up, she has to stop to wait for his past, mild pressure pain lasting for few minutes, but made her feel bad the rest of the day, she did not lose consciousness with those episode, there was no body shaking episode, the only thing she can do when that happened is wait for it to pass,  For a while, she had frequent episode, it is usually triggered when standing up after prolonged sitting, she had it almost daily basis, especially around fall of 2021,  She was seen by local neurologist Dr. Janann Colonel, was  diagnosed with possible seizure, based on abnormal EEG, I was not able to review tracing, EEG report by Dr. Janann Colonel in July 2021 described infrequent sharp waves at left anterior temporal region,  Reported normal MRI of the brain with without contrast in November 2022  She was treated with different  antiepileptic medication since then, oftentimes cannot tolerate epileptic medications due to side effect Keppra,-groggy throughout the day Briviact October 2021, feeling tired, moody, angry easily Vimpat from October 2021 to March 2022, it seems to help her symptoms, but after she got her COVID-vaccine in March 2022, she began to develop side effect taking Vimpat 50 mg twice a day, complains of dizziness, also worsening mood disorder," wanting to die", excessive sleepiness, and fatigue Milly Jakob, May 2022, feeling tired, increased depression, head pressure She was started on thin 10 mg every night since June 08, 2021, it makes her very tired sleepy at nighttime, singly during the day,  She continues to have similar spells, especially every time she got up," I got that feeling coming on", she will stand there for a few seconds, to let it pass,  She was even given prescription of diazepam nasal spray 20 mg dose, she used it few times, seems to help her symptoms some, but complains of sleepiness afterwards  UPDATE August 01 2022: She still has frequent dizziness spells, often triggered by leaning her neck backwards although getting up for prolonged study, complaints of transient tightness in the posterior cervical upper shoulder region, often spreading forward, mild pressure, decreased hearing, lightheadedness, if she holds still for 30 minutes the spell would pass, but the discomfort can linger around for couple hours, occasionally it is associated with headache,  She had 72 hours video EEG August 2022, many pushbutton event, but does not correlate with EEG changes, there was no significant abnormality found, occasionally bilateral frontotemporal sharp transients  Reviewed extensive cardiac evaluation, CT angiogram head and neck December 2022 showed no significant abnormality  Myocardial perfusion study November 2022, ejection fraction 69%, no ST deviation was noted, study was normal  Also reported  extensive ENT evaluation including vestibular testing showed no significant abnormality  She complains of frequent migraine headaches, almost daily mild pressure headaches, but at least 3 times a week, she would have more severe headache with light noise sensitivity, have to take Aleve, lie down resting for few hours, Aleve works well most of the time,  She is already on propanolol 40 mg twice a day, Celexa 40 mg daily, trazodone 100 mg daily, she does wants to try preventive medication for to better control her migraine, will start Ajovy  UPDATE February 06 2023: She was seen by ENT, no significant pathology found.   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 2. Postsurgical changes from recent gastric sleeve procedure. No bowel obstruction or ileus.   She has lost 30 pounds, overall doing well  PHYSICAL EXAM:   Vitals:   02/06/23 1334  BP: 130/75  Pulse: 72  Weight: 242 lb 8 oz (110 kg)  Height: '5\' 7"'$  (1.702 m)   Sitting down 140/80  Standing up  140/80  Body mass index is 37.98 kg/m.  PHYSICAL EXAMNIATION:  Gen: NAD, conversant, well nourised, well groomed                     Cardiovascular: Regular rate rhythm, no peripheral edema, warm, nontender. Eyes: Conjunctivae clear without exudates or hemorrhage Neck: Supple, no carotid bruits. Pulmonary: Clear to auscultation bilaterally   NEUROLOGICAL EXAM:  MENTAL STATUS: Speech/cognition: Awake, alert, oriented to history taking and casual  conversation   CRANIAL NERVES: CN II: Visual fields are full to confrontation. Pupils are round equal and briskly reactive to light. CN III, IV, VI: extraocular movement are normal. No ptosis. CN V: Facial sensation is intact to light touch CN VII: Face is symmetric with normal eye closure  CN VIII: Hearing is normal to causal conversation. CN IX, X: Phonation is normal. CN XI: Head turning and shoulder shrug are intact  MOTOR: There is no pronator drift of out-stretched arms. Muscle bulk and tone are normal. Muscle strength is normal.  REFLEXES: Reflexes are 1 and symmetric at the biceps, triceps, knees, and ankles. Plantar responses are flexor.  SENSORY: Intact to light touch, pinprick and vibratory sensation are intact in fingers and toes.  COORDINATION: There is no trunk or limb dysmetria noted.  GAIT/STANCE: Need push-up to get up from seated position, antalgic  REVIEW OF SYSTEMS:  Full 14 system review of systems performed and notable only for as above All other review of systems were negative.   ALLERGIES: Allergies  Allergen Reactions   Other Itching and Other (See Comments)    Antidepressants-Itching, Gaining Weight Tolerates citalopram     HOME MEDICATIONS: Current Outpatient Medications  Medication Sig Dispense Refill   apixaban (ELIQUIS) 5 MG TABS tablet Take 1 tablet (5 mg total) by mouth 2 (two) times daily. 180 tablet 1   Cholecalciferol (VITAMIN D3) 50 MCG (2000 UT) TABS Take 2,000 Units by mouth daily.     citalopram (CELEXA) 20 MG tablet Take 20 mg by mouth daily as needed (for unresolved depression).     citalopram (CELEXA) 40 MG tablet Take 40 mg by mouth daily.     COLLAGEN PO Take 1 Scoop by mouth daily. Mix and drink as directed     Erenumab-aooe (AIMOVIG) 70 MG/ML SOAJ Inject 70 mg into the skin every 30 (thirty) days. 1.12 mL 11   fluticasone (CUTIVATE) 0.05 % cream Apply 1 application  topically daily as needed (for rashes).     hydrOXYzine  (ATARAX) 25 MG tablet Take 6.25-12.5 mg by mouth every 6 (six) hours as needed for anxiety (or sleep).     ketotifen (ZADITOR) 0.035 % ophthalmic solution Place 1 drop into both eyes 2 (two) times daily as needed (itching).     Multiple Vitamins-Minerals (BARIATRIC FUSION) CHEW Chew 1 tablet by mouth daily.     pantoprazole (PROTONIX) 40 MG tablet Take 1 tablet (40 mg total) by mouth daily. 90 tablet 0   propranolol (INDERAL) 40 MG tablet Take 1 tablet (40 mg total) by mouth 2 (two) times daily. (Patient taking differently: Take 40 mg by mouth in the morning and at bedtime.) 60 tablet 6   TUMS ULTRA 1000 1000 MG chewable tablet Chew 1,000 mg by mouth in the morning and at bedtime.     docusate sodium (COLACE) 100 MG capsule Take 100  mg by mouth in the morning. (Patient not taking: Reported on 02/06/2023)     ondansetron (ZOFRAN-ODT) 4 MG disintegrating tablet Take 1 tablet (4 mg total) by mouth every 6 (six) hours as needed for nausea or vomiting. (Patient not taking: Reported on 02/06/2023) 20 tablet 0   No current facility-administered medications for this visit.   Facility-Administered Medications Ordered in Other Visits  Medication Dose Route Frequency Provider Last Rate Last Admin   ondansetron (ZOFRAN-ODT) disintegrating tablet 4 mg  4 mg Oral Q4H PRN Kinsinger, Arta Bruce, MD       Or   ondansetron (ZOFRAN) 4 mg in sodium chloride 0.9 % 50 mL IVPB  4 mg Intravenous Q4H PRN Kinsinger, Arta Bruce, MD       sodium chloride 0.9 % bolus 2,000 mL  2,000 mL Intravenous Once Kinsinger, Arta Bruce, MD        PAST MEDICAL HISTORY: Past Medical History:  Diagnosis Date   Anxiety    Arthritis    Depression    GERD (gastroesophageal reflux disease)    Headache    Hypertension    Motion sickness    Palpitations    Plantar fasciitis    Seizures ([place])    Vertigo     PAST SURGICAL HISTORY: Past Surgical History:  Procedure Laterality Date   BREAST REDUCTION SURGERY     COLONOSCOPY N/A  09/06/2019   Procedure: COLONOSCOPY;  Surgeon: Danie Binder, MD;  Location: [facility];  Service: Endoscopy;  Laterality: N/A;  2:00   LAPAROSCOPIC GASTRIC SLEEVE RESECTION N/A 11/05/2022   Procedure: LAPAROSCOPIC SLEEVE GASTRECTOMY;  Surgeon: Kieth Brightly, Arta Bruce, MD;  Location: [facility];  Service: General;  Laterality: N/A;   POLYPECTOMY  09/06/2019   Procedure: POLYPECTOMY;  Surgeon: Danie Binder, MD;  Location: [facility];  Service: Endoscopy;;   REDUCTION MAMMAPLASTY     TUBAL LIGATION     UPPER GI ENDOSCOPY N/A 11/05/2022   Procedure: UPPER GI ENDOSCOPY;  Surgeon: Kieth Brightly Arta Bruce, MD;  Location: [facility];  Service: General;  Laterality: N/A;    FAMILY HISTORY: Family History  Problem Relation Age of Onset   Other Mother        unsure of medical history   Other Father        unknown medical history   Drug abuse Brother    Suicidality Brother    Colon cancer Neg Hx     SOCIAL HISTORY: Social History   Socioeconomic History   Marital status: Single    Spouse name: Not on file   Number of children: 2   Years of education: 12   Highest education level: High school graduate  Occupational History   Occupation: Retired  Tobacco Use   Smoking status: Never   Smokeless tobacco: Never  Scientific laboratory technician Use: Never used  Substance and Sexual Activity   Alcohol use: Never   Drug use: Never   Sexual activity: Yes    Birth control/protection: Surgical    Comment: tubal  Other Topics Concern   Not on file  Social History Narrative   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   Right-handed.   Lives with significant other.   One cup caffeine per day, occasional glass of tea.   Social Determinants of Health   Financial Resource Strain: Medium Risk (08/01/2020)   Overall Financial Resource Strain (CARDIA)    Difficulty of Paying Living Expenses: Somewhat hard  Food Insecurity: No Food Insecurity (11/22/2022)  Hunger Vital Sign    Worried About  Running Out of Food in the Last Year: Never true    Ran Out of Food in the Last Year: Never true  Transportation Needs: No Transportation Needs (11/22/2022)   PRAPARE - Hydrologist (Medical): No    Lack of Transportation (Non-Medical): No  Physical Activity: Inactive (08/01/2020)   Exercise Vital Sign    Days of Exercise per Week: 0 days    Minutes of Exercise per Session: 0 min  Stress: Stress Concern Present (08/01/2020)   [place]    Feeling of Stress : Very much  Social Connections: Socially Isolated (08/01/2020)   Social Connection and Isolation Panel [NHANES]    Frequency of Communication with Friends and Family: More than three times a week    Frequency of Social Gatherings with Friends and Family: More than three times a week    Attends Religious Services: Never    Marine scientist or Organizations: No    Attends Archivist Meetings: Never    Marital Status: Divorced  Human resources officer Violence: Not At Risk (11/22/2022)   Humiliation, Afraid, Rape, and Kick questionnaire    Fear of Current or Ex-Partner: No    Emotionally Abused: No    Physically Abused: No    Sexually Abused: No      Marcial Pacas, M.D. Ph.D.  [facility] [address], [place], [place] [address] Ph: [phone] Fax: [phone]  CC:  Carrolyn Meiers, MD [place],  [place] [address]  Carrolyn Meiers, MD

## 2023-02-06 NOTE — Patient Instructions (Signed)
Pre-Operative Instructions  Congratulations, you have decided to take an important step to improving your quality of life.  You can be assured that the doctors of [facility] will be with you every step of the way.  Plan to be at the surgery center/hospital at least 1 (one) hour prior to your scheduled time unless otherwise directed by the surgical center/hospital staff.  You must have a responsible adult accompany you, remain during the surgery and drive you home.  Make sure you have directions to the surgical center/hospital and know how to get there on time. For hospital based surgery you will need to obtain a history and physical form from your family physician within 1 month prior to the date of surgery- we will give you a form for you primary physician.  We make every effort to accommodate the date you request for surgery.  There are however, times where surgery dates or times have to be moved.  We will contact you as soon as possible if a change in schedule is required.   No Aspirin/Ibuprofen for one week before surgery.  If you are on aspirin, any non-steroidal anti-inflammatory medications (Mobic, Aleve, Ibuprofen) you should stop taking it 7 days prior to your surgery.  You make take Tylenol  For pain prior to surgery.  Medications- If you are taking daily heart and blood pressure medications, seizure, reflux, allergy, asthma, anxiety, pain or diabetes medications, make sure the surgery center/hospital is aware before the day of surgery so they may notify you which medications to take or avoid the day of surgery. No food or drink after midnight the night before surgery unless directed otherwise by surgical center/hospital staff. No alcoholic beverages 24 hours prior to surgery.  No smoking 24 hours prior to or 24 hours after surgery. Wear loose pants or shorts- loose enough to fit over bandages, boots, and casts. No slip on shoes, sneakers are best. Bring your boot with you to the  surgery center/hospital.  Also bring crutches or a walker if your physician has prescribed it for you.  If you do not have this equipment, it will be provided for you after surgery. If you have not been contracted by the surgery center/hospital by the day before your surgery, call to confirm the date and time of your surgery. Leave-time from work may vary depending on the type of surgery you have.  Appropriate arrangements should be made prior to surgery with your employer. Prescriptions will be provided immediately following surgery by your doctor.  Have these filled as soon as possible after surgery and take the medication as directed. Remove nail polish on the operative foot. Wash the night before surgery.  The night before surgery wash the foot and leg well with the antibacterial soap provided and water paying special attention to beneath the toenails and in between the toes.  Rinse thoroughly with water and dry well with a towel.  Perform this wash unless told not to do so by your physician.  Enclosed: 1 Ice pack (please put in freezer the night before surgery)   1 Hibiclens skin cleaner   Pre-op Instructions  If you have any questions regarding the instructions, do not hesitate to call our office at any point during this process.   [place]: [address] [place], [place] [address] [phone]  [facility]: [address] [place], [place] [address] [phone]  Dr. Gurnoor Sloop, DPM  

## 2023-02-10 ENCOUNTER — Encounter: Payer: Self-pay | Admitting: Podiatry

## 2023-02-10 ENCOUNTER — Telehealth: Payer: Self-pay | Admitting: Urology

## 2023-02-10 ENCOUNTER — Telehealth: Payer: Self-pay | Admitting: Cardiology

## 2023-02-10 ENCOUNTER — Telehealth: Payer: Self-pay

## 2023-02-10 NOTE — Telephone Encounter (Signed)
Patient agreeable with telehealth appointment.   Med list reviewed and consent given.   Patient voiced understanding.

## 2023-02-10 NOTE — Telephone Encounter (Signed)
  Patient Consent for Virtual Visit         Kathryn Compton has provided verbal consent on 02/10/2023 for a virtual visit (video or telephone).   CONSENT FOR VIRTUAL VISIT FOR:  Kathryn Compton  By participating in this virtual visit I agree to the following:  I hereby voluntarily request, consent and authorize [facility] and its employed or contracted physicians, physician assistants, nurse practitioners or other licensed health care professionals (the Practitioner), to provide me with telemedicine health care services (the "Services") as deemed necessary by the treating Practitioner. I acknowledge and consent to receive the Services by the Practitioner via telemedicine. I understand that the telemedicine visit will involve communicating with the Practitioner through live audiovisual communication technology and the disclosure of certain medical information by electronic transmission. I acknowledge that I have been given the opportunity to request an in-person assessment or other available alternative prior to the telemedicine visit and am voluntarily participating in the telemedicine visit.  I understand that I have the right to withhold or withdraw my consent to the use of telemedicine in the course of my care at any time, without affecting my right to future care or treatment, and that the Practitioner or I may terminate the telemedicine visit at any time. I understand that I have the right to inspect all information obtained and/or recorded in the course of the telemedicine visit and may receive copies of available information for a reasonable fee.  I understand that some of the potential risks of receiving the Services via telemedicine include:  Delay or interruption in medical evaluation due to technological equipment failure or disruption; Information transmitted may not be sufficient (e.g. poor resolution of images) to allow for appropriate medical decision making by the  Practitioner; and/or  In rare instances, security protocols could fail, causing a breach of personal health information.  Furthermore, I acknowledge that it is my responsibility to provide information about my medical history, conditions and care that is complete and accurate to the best of my ability. I acknowledge that Practitioner's advice, recommendations, and/or decision may be based on factors not within their control, such as incomplete or inaccurate data provided by me or distortions of diagnostic images or specimens that may result from electronic transmissions. I understand that the practice of medicine is not an exact science and that Practitioner makes no warranties or guarantees regarding treatment outcomes. I acknowledge that a copy of this consent can be made available to me via my patient portal ([facility]), or I can request a printed copy by calling the office of [facility].    I understand that my insurance will be billed for this visit.   I have read or had this consent read to me. I understand the contents of this consent, which adequately explains the benefits and risks of the Services being provided via telemedicine.  I have been provided ample opportunity to ask questions regarding this consent and the Services and have had my questions answered to my satisfaction. I give my informed consent for the services to be provided through the use of telemedicine in my medical care

## 2023-02-10 NOTE — Telephone Encounter (Signed)
DOS - 02/26/23  H[facility] REPAIR 2ND LEFT --- [id number]  [employer]   PER [place] [id number] Notification or Prior Authorization is not required for the requested services You are not required to submit a notification/prior authorization based on the information provided. The number above acknowledges your inquiry and our response. Please write this number down and refer to it for future inquiries. If you still wish to submit your request for review, please select the Continue with Submission button below.   Decision ID #: [id number]

## 2023-02-10 NOTE — Telephone Encounter (Signed)
   Name: Kathryn Compton  DOB: [birth year]  MRN: [id number]  Primary Cardiologist: Carlyle Dolly, MD   Preoperative team, please contact this patient and set up a phone call appointment for further preoperative risk assessment. Please obtain consent and complete medication review. Thank you for your help.  I confirm that guidance regarding antiplatelet and oral anticoagulation therapy has been completed and, if necessary, noted below.  -Patient is currently on Eliquis that is not managed by cardiology.  Recommendations for holding will need to come from managing provider.   Mable Fill, Marissa Nestle, NP 02/10/2023, 10:32 AM [place]

## 2023-02-10 NOTE — Telephone Encounter (Signed)
   Pre-operative Risk Assessment    Patient Name: Kathryn Compton  DOB: [birth year] MRN: [id number]      Request for Surgical Clearance    Procedure:   Hammer Toe Repair (Second toe, left foot)  Date of Surgery:  Clearance 02/26/23                                 Surgeon:  Dr. Mayo Ao Surgeon's Group or Practice Name:  [facility] Phone number:  [phone] Fax number:  [phone]   Type of Clearance Requested:   - Medical  - Pharmacy:  Hold    Requesting Cardiology recommendation on what meds need to be held   Type of Anesthesia:   Choice   Additional requests/questions:    Karie Soda   02/10/2023, 10:21 AM

## 2023-02-13 DIAGNOSIS — I10 Essential (primary) hypertension: Secondary | ICD-10-CM | POA: Diagnosis not present

## 2023-02-14 ENCOUNTER — Telehealth: Payer: Self-pay

## 2023-02-14 NOTE — Telephone Encounter (Signed)
Christella called to cancel her surgery with Dr. Jacqualyn Posey on 02/26/2023. She stated she just doesn't want to do it right now. Notified Dr. Jacqualyn Posey and Caren Griffins with [facility]

## 2023-02-17 ENCOUNTER — Ambulatory Visit: Payer: 59

## 2023-02-20 ENCOUNTER — Encounter: Payer: Self-pay | Admitting: Dietician

## 2023-02-20 ENCOUNTER — Encounter: Payer: 59 | Attending: General Surgery | Admitting: Dietician

## 2023-02-20 VITALS — Ht 67.0 in | Wt 241.1 lb

## 2023-02-20 DIAGNOSIS — Z9884 Bariatric surgery status: Secondary | ICD-10-CM | POA: Insufficient documentation

## 2023-02-20 DIAGNOSIS — Z6837 Body mass index (BMI) 37.0-37.9, adult: Secondary | ICD-10-CM | POA: Insufficient documentation

## 2023-02-20 DIAGNOSIS — E669 Obesity, unspecified: Secondary | ICD-10-CM

## 2023-02-20 DIAGNOSIS — Z713 Dietary counseling and surveillance: Secondary | ICD-10-CM | POA: Insufficient documentation

## 2023-02-20 NOTE — Progress Notes (Signed)
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 BMI: 37.76 kg/m   Clinical Medications: see list Health/ medical history changes: ED visit; hospital stay for 6 days; hydration clinic visit  Body Composition Scale 02/20/2023  Weight  lbs 241.1  Total Body Fat  % 45.5     Visceral Fat 17  Fat-Free Mass  % 54.4     Total Body Water  % 41.7     Muscle-Mass  lbs 30.4  BMI 37.5  Body Fat Displacement ---        Torso  lbs 68.0        Left Leg  lbs 13.6        Right Leg  lbs 13.6        Left Arm  lbs 6.8        Right Arm  lbs 6.8    Lifestyle & Dietary Hx  Pt arrived with supportive husband; Pt's husband is trying to follow bariatric food patterns and goals with his wife. Pt states she has a hard time tolerating water, but tolerating more when exercising and the weather gets warmer. Pt states the plant based proteins she enjoyed, stating except the crumbles she used for taco meat. Pt states she is allergic to MSG, stating that they choose organic foods. Pt states she is lactose intolerant, stating the A2 milk was helping her with digestion. Pt states she drinks a lot of milk during the day, stating about 32 oz a day.  Estimated daily fluid intake: 40 oz Estimated daily protein intake: 60 g Supplements: multivitamin and calcium Current average weekly physical activity: gym ([employer]) 2-4 days a week, 30-45 minutes  24-Hr Dietary Recall First Meal: protein shake Snack:   Second Meal: 3 chicken wings or crustless quiche with ham, broccoli, cauliflower, cheese, eggs ([id number] of a slice) Snack:  nuts Third Meal: quiche or meat  Snack: outshine frozen pop cycle (sugar free) Beverages: water, milk (a2 protein milk  2%)  Post-Op Goals/ Signs/ Symptoms Using straws: no Drinking while eating: no Chewing/swallowing difficulties: no Changes in vision: no Changes to mood/headaches: no Hair loss/changes to skin/nails: n Difficulty focusing/concentrating: no Sweating: no Limb weakness: no Dizziness/lightheadedness: no Palpitations: no  Carbonated/caffeinated beverages: no N/V/D/C/Gas: no Abdominal pain: no Dumping syndrome: no    NUTRITION DIAGNOSIS  Overweight/obesity ([place]-3.3) related to past poor dietary habits and physical inactivity as evidenced by completed bariatric surgery and following dietary guidelines for continued weight loss and healthy nutrition status.     NUTRITION INTERVENTION Nutrition counseling (C-1) and education (E-2) to facilitate bariatric surgery goals, including: Diet advancement to Standard Prep Plan The importance of consuming adequate calories as well as certain nutrients daily due to the body's need for essential vitamins, minerals, and fats The importance of daily physical activity and to reach a goal of at least 150 minutes of moderate to vigorous physical activity weekly (or as directed by their physician) due to benefits such as increased musculature and improved lab values The importance of intuitive eating specifically learning hunger-satiety cues and understanding the importance of learning a new body: The importance of mindful eating to avoid grazing behaviors  Encouraged patient to honor their body's internal hunger and fullness cues.  Throughout the day, check in mentally and rate hunger. Stop  eating when satisfied not full regardless of how much food is left on the plate.  Get more if still hungry 20-30 minutes later.  The key is to honor satisfaction so throughout the meal, rate fullness factor and stop when comfortably satisfied not physically full. The key is to honor hunger and fullness without any feelings of guilt or shame.  Pay attention to what the internal  cues are, rather than any external factors. This will enhance the confidence you have in listening to your own body and following those internal cues enabling you to increase how often you eat when you are hungry not out of appetite and stop when you are satisfied not full.  Encouraged pt to continue to eat balanced meals inclusive of non starchy vegetables 2 times a day 7 days a week Encourage pt to choose healthy fats such as plant based limiting animal fats Encouraged pt to continue to drink a minium 64 fluid ounces with half being plain water to satisfy proper hydration   Why you need complex carbohydrates: Whole grains and other complex carbohydrates are required to have a healthy diet. Whole grains provide fiber which can help with blood glucose levels and help keep you satiated. Fruits and starchy vegetables provide essential vitamins and minerals required for immune function, eyesight support, brain support, bone density, wound healing and many other functions within the body. According to the current evidenced based 2020-2025 Dietary Guidelines for Americans, complex carbohydrates are part of a healthy eating pattern which is associated with a decreased risk for type 2 diabetes, cancers, and cardiovascular disease.   Goals -New: increase fluid intake, aim for 64 oz, mostly plain water; reduce the amount of milk; aim for calorie free fluids between meals and snacks -New: increase physical activity; aim for 150 minutes of moderate activity a week.  Handouts Provided Include  Standard Prep Plan handout Health Benefits of Physical Activity  Learning Style & Readiness for Change Teaching method utilized: Visual & Auditory  Demonstrated degree of understanding via: Teach Back  Readiness Level: preparation  Barriers to learning/adherence to lifestyle change: tolerance to food  RD's Notes for Next Visit Assess adherence to pt chosen goals  MONITORING & EVALUATION Dietary intake, weekly  physical activity, body weight.  Next Steps Patient is to follow-up in 3 months for 6 month post-op follow-up/class.

## 2023-02-25 ENCOUNTER — Encounter: Payer: Self-pay | Admitting: Orthopaedic Surgery

## 2023-02-25 ENCOUNTER — Ambulatory Visit (INDEPENDENT_AMBULATORY_CARE_PROVIDER_SITE_OTHER): Payer: 59 | Admitting: Orthopaedic Surgery

## 2023-02-25 VITALS — BP 129/79 | HR 59 | Ht 67.0 in | Wt 245.0 lb

## 2023-02-25 DIAGNOSIS — G8929 Other chronic pain: Secondary | ICD-10-CM

## 2023-02-25 DIAGNOSIS — M170 Bilateral primary osteoarthritis of knee: Secondary | ICD-10-CM | POA: Diagnosis not present

## 2023-02-25 NOTE — Progress Notes (Signed)
She had good results on the left knee with viscosupplementation but the right knee is hurting, not as bad, but hurts.  She has good ROM on the left but the right is 0 to 105, crepitus, slight effusion.  NV intact.  Encounter Diagnoses  Name Primary?   Primary osteoarthritis of both knees Yes   Chronic pain of right knee    She cannot take NSAIDs as she is on Eliquis.  She will continue doing as she is for now.  Return in one month.  Call if any problem.  Precautions discussed.  Electronically Signed Sanjuana Kava, MD 3/26/20241:35 PM

## 2023-02-25 NOTE — Patient Instructions (Signed)
Aspercreme, Biofreeze, Blue Emu or Voltaren Gel over the counter 2-3 times daily. Rub into area well each use for best results.  DR.KEELING'S SCHEDULE IS AS FOLLOWS: TUESDAY: ALL DAY WEDNESDAY: MORNING ONLY THURSDAY: MORNING ONLY PLEASE CALL OR SEND A MESSAGE VIA MYCHART BY WEDNESDAY MORNING SO THAT I CAN SEND A REQUEST TO HIM. HE LEAVES THURSDAY BY 11:30AM. HE DOES NOT RETURN TO THE OFFICE UNTIL TUESDAY MORNINGS AND HE DOES NOT CHECK HIS WORK MESSAGES DURING HIS TIME AWAY FROM THE OFFICE.

## 2023-02-26 ENCOUNTER — Ambulatory Visit (INDEPENDENT_AMBULATORY_CARE_PROVIDER_SITE_OTHER): Payer: 59 | Admitting: Clinical

## 2023-02-26 DIAGNOSIS — F411 Generalized anxiety disorder: Secondary | ICD-10-CM | POA: Diagnosis not present

## 2023-02-26 NOTE — BH Specialist Note (Signed)
Integrated Behavioral Health via Telemedicine Visit  02/26/2023 Kathryn Compton [id number]  Number of [place] Clinician visits: Additional Visit  Session Start time: 1204   Session End time: [id number]  Total time in minutes: 51   Referring Provider: Darron Doom, MD Patient/Family location: Home [facility] Provider location: Center for [place] [facility]  All persons participating in visit: [name] and Kathryn Compton   Types of Service: Individual psychotherapy and Telephone visit  I connected with [name] and/or [name] Tenpas's  n/a  via  Telephone or Video Enabled Telemedicine Application  (Video is Caregility application) and verified that I am speaking with the correct person using two identifiers. Discussed confidentiality: Yes   I discussed the limitations of telemedicine and the availability of in person appointments.  Discussed there is a possibility of technology failure and discussed alternative modes of communication if that failure occurs.  I discussed that engaging in this telemedicine visit, they consent to the provision of behavioral healthcare and the services will be billed under their insurance.  Patient and/or legal guardian expressed understanding and consented to Telemedicine visit: Yes   Presenting Concerns: Patient and/or family reports the following symptoms/concerns: Distress at not losing more weight post-surgery and processing emotions regarding partner's grieving process after the loss of his mother. Pt is walking and doing cardio exercise four days/week, eating healthy and sleeping well; is considering adding weights to exercise routine.  Duration of problem: Ongoing with increased stress past two months; Severity of problem: moderate  Patient and/or Family's Strengths/Protective Factors: Social connections, Concrete supports in place (healthy food, safe environments, etc.),  Sense of purpose, and Physical Health (exercise, healthy diet, medication compliance, etc.)  Goals Addressed: Patient will:  Reduce symptoms of: anxiety, depression, and stress   Increase knowledge and/or ability of: stress reduction   Demonstrate ability to: Increase motivation to adhere to plan of care  Progress towards Goals: Ongoing  Interventions: Interventions utilized:  Motivational Interviewing Standardized Assessments completed: Not Needed  Patient and/or Family Response: Patient agrees with treatment plan.   Assessment: Patient currently experiencing Generalized anxiety disorder.   Patient may benefit from continued therapeutic interventions.  Plan: Follow up with behavioral health clinician on : Two weeks Behavioral recommendations:  -Continue working out at [employer] four days/week. Switch from all days doing cardio to two days cardio and two days weights, as recommended by previous medical provider -Continue doing outdoor walks with partner on good-weather days -Continue healthy eating and sleeping habits daily -Share grief and walk-in therapy information on After VIsit Summary with partner Referral(s): [facility] (In Clinic)  I discussed the assessment and treatment plan with the patient and/or parent/guardian. They were provided an opportunity to ask questions and all were answered. They agreed with the plan and demonstrated an understanding of the instructions.   They were advised to call back or seek an in-person evaluation if the symptoms worsen or if the condition fails to improve as anticipated.  Caroleen Hamman Mollie Rossano, LCSW     04/19/2022    1:47 PM 02/12/2022   11:22 AM 01/15/2022   10:55 AM 09/28/2021   10:25 AM 09/11/2021    1:17 PM  Depression screen PHQ 2/9  Decreased Interest 0 1 0 1 2  Down, Depressed, Hopeless 0 1 0 3 2  PHQ - 2 Score 0 2 0 4 4  Altered sleeping  0 0 3 3  Tired, decreased energy  1 1 3  3  Change in appetite   0 0 1 2  Feeling bad or failure about yourself   3 3 3 3   Trouble concentrating  3 3 0 0  Moving slowly or fidgety/restless  0 0 0 0  Suicidal thoughts  0 0 1   PHQ-9 Score  9 7 15 15       02/12/2022   11:24 AM 01/15/2022   10:58 AM 09/28/2021   10:23 AM 05/09/2021    3:28 PM  GAD 7 : Generalized Anxiety Score  Nervous, Anxious, on Edge 1 1 3 2   Control/stop worrying 0 0 0 2  Worry too much - different things 2 0 2 3  Trouble relaxing 0 0 3 2  Restless 3 3 3 2   Easily annoyed or irritable 0 1 1 3   Afraid - awful might happen 0 0 0 2  Total GAD 7 Score 6 5 12  16

## 2023-02-26 NOTE — Patient Instructions (Signed)
Center for [facility] [place], [place] [address] [phone] (main office) [phone] (Stryker Veasey's office)  [facility] (Individual and group grief support) [email or web address]  [address] [place], [place] [address] [phone] or [phone] [facility] 24/7 FOR ANYONE [address], [place], [place] Fax: [phone] [email or web address] *Interpreters available *Accepts all insurance and uninsured for Urgent Care needs *Accepts Medicaid and uninsured for outpatient treatment (below)    ONLY FOR [facility]  Below:   Outpatient New Patient Assessment/Therapy Walk-ins:        Monday -Thursday 8am until slots are full.        Every Friday 1pm-4pm  (first come, first served)                   New Patient Psychiatry/Medication Management        Monday-Friday 8am-11am (first come, first served)              For all walk-ins we ask that you arrive by 7:15am, because patients will be seen in the order of arrival.

## 2023-03-03 ENCOUNTER — Ambulatory Visit: Payer: 59

## 2023-03-03 NOTE — BH Specialist Note (Signed)
Pt did not arrive to video visit and did not answer the phone; Left HIPPA-compliant message to call back Galen Malkowski from [facility] at  [phone] (Kacie Huxtable's office).  ?; left MyChart message for patient.  ? ?

## 2023-03-05 ENCOUNTER — Telehealth: Payer: Self-pay | Admitting: "Endocrinology

## 2023-03-05 ENCOUNTER — Other Ambulatory Visit: Payer: Self-pay | Admitting: "Endocrinology

## 2023-03-05 DIAGNOSIS — E782 Mixed hyperlipidemia: Secondary | ICD-10-CM

## 2023-03-05 NOTE — Telephone Encounter (Signed)
Pt is requesting an appt. Do you want any labs 

## 2023-03-13 ENCOUNTER — Ambulatory Visit: Payer: 59

## 2023-03-16 DIAGNOSIS — I10 Essential (primary) hypertension: Secondary | ICD-10-CM | POA: Diagnosis not present

## 2023-03-17 ENCOUNTER — Ambulatory Visit: Payer: 59 | Admitting: Clinical

## 2023-03-17 DIAGNOSIS — Z91199 Patient's noncompliance with other medical treatment and regimen due to unspecified reason: Secondary | ICD-10-CM

## 2023-03-18 ENCOUNTER — Ambulatory Visit (INDEPENDENT_AMBULATORY_CARE_PROVIDER_SITE_OTHER): Payer: 59 | Admitting: Clinical

## 2023-03-18 DIAGNOSIS — Z658 Other specified problems related to psychosocial circumstances: Secondary | ICD-10-CM

## 2023-03-18 DIAGNOSIS — F411 Generalized anxiety disorder: Secondary | ICD-10-CM

## 2023-03-18 NOTE — BH Specialist Note (Signed)
Integrated Behavioral Health via Telemedicine Visit  03/18/2023 Kathryn Compton [id number]  Number of Integrated Behavioral Health Clinician visits: Additional Visit  Session Start time: 1045   Session End time: 1118  Total time in minutes: 33   Referring Provider: Tinnie Gens, MD Patient/Family location: Parked car/[facility] Provider location: [facility] for [facility]  All persons participating in visit: [name] and [facility] Kathryn Compton   Types of Service: Individual psychotherapy and Telephone visit  I connected with Kathryn Compton and/or Kathryn Compton's  partner briefly  via  Telephone or Video Enabled Telemedicine Application  (Video is Caregility application) and verified that I am speaking with the correct person using two identifiers. Discussed confidentiality: Yes   I discussed the limitations of telemedicine and the availability of in person appointments.  Discussed there is a possibility of technology failure and discussed alternative modes of communication if that failure occurs.  I discussed that engaging in this telemedicine visit, they consent to the provision of behavioral healthcare and the services will be billed under their insurance.  Patient and/or legal guardian expressed understanding and consented to Telemedicine visit: Yes   Presenting Concerns: Patient and/or family reports the following symptoms/concerns: Processing thoughts and feelings regarding caring for partner after his mother passed, along with additional financial stress.  Duration of problem: Ongoing; Severity of problem: moderate  Patient and/or Family's Strengths/Protective Factors: Social connections, Concrete supports in place (healthy food, safe environments, etc.), Sense of purpose, and Physical Health (exercise, healthy diet, medication compliance, etc.)  Goals Addressed: Patient will:  Maintain reduction of symptoms of:  anxiety and stress   Increase knowledge and/or ability of: stress reduction   Demonstrate ability to: Increase healthy adjustment to current life circumstances  Progress towards Goals: Ongoing  Interventions: Interventions utilized:  Supportive Reflection Standardized Assessments completed: Not Needed  Patient and/or Family Response: Patient agrees with treatment plan.   Assessment: Patient currently experiencing Generalized anxiety disorder; Psychosocial stress.   Patient may benefit from continued therapeutic interventions.  Plan: Follow up with behavioral health clinician on : Two weeks Behavioral recommendations:  -Continue using self-coping strategies daily to manage emotions during this stressful time -Consider contacting health insurance about any transportation benefits to/from medical appointments for when partner is out of town -Consider using delivery services for food and other essential items when partner is out of town  Referral(s): Integrated [employer] (In Clinic)  I discussed the assessment and treatment plan with the patient and/or parent/guardian. They were provided an opportunity to ask questions and all were answered. They agreed with the plan and demonstrated an understanding of the instructions.   They were advised to call back or seek an in-person evaluation if the symptoms worsen or if the condition fails to improve as anticipated.  Kathryn Compton Kathryn Wicklund, LCSW

## 2023-03-24 NOTE — BH Specialist Note (Signed)
Integrated Behavioral Health via Telemedicine Visit  03/24/2023 Kathryn Compton [id number]  Number of Integrated Behavioral Health Clinician visits: Additional Visit  Session Start time: 1045   Session End time: 1118  Total time in minutes: 33  Referring Provider: Tinnie Gens, MD Patient/Family location: Home [facility] Provider location: [facility] for [facility]  All persons participating in visit: [name] and [facility] Jackqulyn Mendel   Types of Service: Individual psychotherapy and Telephone visit  I connected with Kathryn Compton and/or Kathryn Compton's  partner Kathryn Compton, briefly,   via  Telephone or [employer]  (Video is Caregility application) and verified that I am speaking with the correct person using two identifiers. Discussed confidentiality: Yes   I discussed the limitations of telemedicine and the availability of in person appointments.  Discussed there is a possibility of technology failure and discussed alternative modes of communication if that failure occurs.  I discussed that engaging in this telemedicine visit, they consent to the provision of behavioral healthcare and the services will be billed under their insurance.  Patient and/or legal guardian expressed understanding and consented to Telemedicine visit: Yes   Presenting Concerns: Patient and/or family reports the following symptoms/concerns: Distress at having lost only 30 pounds post-surgery, even after daily exercise and healthy eating.  Duration of problem: Ongoing; Severity of problem: moderate  Patient and/or Family's Strengths/Protective Factors: Social connections, Social and Emotional competence, Concrete supports in place (healthy food, safe environments, etc.), Sense of purpose, and Physical Health (exercise, healthy diet, medication compliance, etc.)  Goals Addressed: Patient will:  Reduce symptoms of: anxiety and stress    Increase knowledge and/or ability of: stress reduction   Demonstrate ability to: Increase healthy adjustment to current life circumstances  Progress towards Goals: Ongoing  Interventions: Interventions utilized:  Motivational Interviewing and Supportive Reflection Standardized Assessments completed: Not Needed  Patient and/or Family Response: Patient agrees with treatment plan Assessment: Patient currently experiencing Generalized anxiety disorder; Psychosocial stress.   Patient may benefit from continued therapeutic intervention.  Plan: Follow up with behavioral health clinician on : Two weeks Behavioral recommendations:  -Continue prioritizing healthy eating and daily exercise -Discuss with endocrinologist any additional testing that may need to be done; concerns about lack of further weight loss -Discuss with nutritionist any additional changes to maximize weight loss Referral(s): Integrated [employer] (In Clinic)  I discussed the assessment and treatment plan with the patient and/or parent/guardian. They were provided an opportunity to ask questions and all were answered. They agreed with the plan and demonstrated an understanding of the instructions.   They were advised to call back or seek an in-person evaluation if the symptoms worsen or if the condition fails to improve as anticipated.  [name] Dorismar Chay, LCSW     04/19/2022    1:47 PM 02/12/2022   11:22 AM 01/15/2022   10:55 AM 09/28/2021   10:25 AM 09/11/2021    1:17 PM  Depression screen PHQ 2/9  Decreased Interest 0 1 0 1 2  Down, Depressed, Hopeless 0 1 0 3 2  PHQ - 2 Score 0 2 0 4 4  Altered sleeping  0 0 3 3  Tired, decreased energy  1 1 3 3   Change in appetite  0 0 1 2  Feeling bad or failure about yourself   3 3 3 3   Trouble concentrating  3 3 0 0  Moving slowly or fidgety/restless  0 0 0 0  Suicidal thoughts  0 0 1   PHQ-9 Score  9 7 15 15       02/12/2022   11:24 AM 01/15/2022    10:58 AM 09/28/2021   10:23 AM 05/09/2021    3:28 PM  GAD 7 : Generalized Anxiety Score  Nervous, Anxious, on Edge 1 1 3 2   Control/stop worrying 0 0 0 2  Worry too much - different things 2 0 2 3  Trouble relaxing 0 0 3 2  Restless 3 3 3 2   Easily annoyed or irritable 0 1 1 3   Afraid - awful might happen 0 0 0 2  Total GAD 7 Score 6 5 12  16

## 2023-03-25 ENCOUNTER — Ambulatory Visit: Payer: 59 | Admitting: Orthopaedic Surgery

## 2023-03-25 ENCOUNTER — Encounter: Payer: 59 | Admitting: Podiatry

## 2023-04-01 DIAGNOSIS — M170 Bilateral primary osteoarthritis of knee: Secondary | ICD-10-CM | POA: Diagnosis not present

## 2023-04-04 ENCOUNTER — Ambulatory Visit (INDEPENDENT_AMBULATORY_CARE_PROVIDER_SITE_OTHER): Payer: 59 | Admitting: Clinical

## 2023-04-04 DIAGNOSIS — F411 Generalized anxiety disorder: Secondary | ICD-10-CM

## 2023-04-04 DIAGNOSIS — Z658 Other specified problems related to psychosocial circumstances: Secondary | ICD-10-CM

## 2023-04-09 NOTE — BH Specialist Note (Signed)
Integrated Behavioral Health via Telemedicine Visit  04/23/2023 Rosealyn Bouck [id number]  Number of Integrated Behavioral Health Clinician visits: Additional Visit  Session Start time: 1117   Session End time: 1147  Total time in minutes: 30   Referring Provider: Tinnie Gens, MD Patient/Family location: [place] [facility] Provider location: Center for [facility]  All persons participating in visit: [name] and [facility] Jacquise Rarick   Types of Service: Individual psychotherapy and Telephone visit  I connected with [name] and/or Vernona Rieger Baucom's  n/a  via  Telephone or Video Enabled Telemedicine Application  (Video is Caregility application) and verified that I am speaking with the correct person using two identifiers. Discussed confidentiality: Yes   I discussed the limitations of telemedicine and the availability of in person appointments.  Discussed there is a possibility of technology failure and discussed alternative modes of communication if that failure occurs.  I discussed that engaging in this telemedicine visit, they consent to the provision of behavioral healthcare and the services will be billed under their insurance.  Patient and/or legal guardian expressed understanding and consented to Telemedicine visit: Yes   Presenting Concerns: Patient and/or family reports the following symptoms/concerns: Stressful walk in woods (lost on new trail/finding ticks on partner), along with financial stress (considering plasma donation).  Duration of problem: ongoing; Severity of problem: moderate  Patient and/or Family's Strengths/Protective Factors: Social connections, Social and Emotional competence, Concrete supports in place (healthy food, safe environments, etc.), Sense of purpose, and Physical Health (exercise, healthy diet, medication compliance, etc.)  Goals Addressed: Patient will:  Reduce symptoms of: anxiety  and stress   Increase knowledge and/or ability of: stress reduction   Demonstrate ability to: Increase healthy adjustment to current life circumstances  Progress towards Goals: Ongoing  Interventions: Interventions utilized:  Solution-Focused Strategies Standardized Assessments completed: Not Needed  Patient and/or Family Response: Patient agrees with treatment plan.   Assessment: Patient currently experiencing Generalized anxiety disorder; Psychosocial stress.   Patient may benefit from continued therapeutic interventions.  Plan: Follow up with behavioral health clinician on : Two weeks Behavioral recommendations:  -Continue prioritizing healthy eating and daily exercise (Treadmill instead of outdoor hiking trails until partner comes home) -Continue plan to attend upcoming medical appointments (nutritionist; endocrinology) -Discuss any health risks of plasma donations with PCP Referral(s): Integrated [employer] (In Clinic)  I discussed the assessment and treatment plan with the patient and/or parent/guardian. They were provided an opportunity to ask questions and all were answered. They agreed with the plan and demonstrated an understanding of the instructions.   They were advised to call back or seek an in-person evaluation if the symptoms worsen or if the condition fails to improve as anticipated.  [name] Janeva Peaster, LCSW

## 2023-04-10 ENCOUNTER — Ambulatory Visit (HOSPITAL_COMMUNITY)
Admission: RE | Admit: 2023-04-10 | Discharge: 2023-04-10 | Disposition: A | Discharge status: Home / Self Care | Payer: 59 | Source: Ambulatory Visit | Attending: Physician Assistant | Admitting: Physician Assistant

## 2023-04-10 ENCOUNTER — Inpatient Hospital Stay: Payer: 59 | Attending: Physician Assistant

## 2023-04-10 DIAGNOSIS — I81 Portal vein thrombosis: Secondary | ICD-10-CM | POA: Insufficient documentation

## 2023-04-10 DIAGNOSIS — R7989 Other specified abnormal findings of blood chemistry: Secondary | ICD-10-CM | POA: Diagnosis not present

## 2023-04-10 DIAGNOSIS — Z7901 Long term (current) use of anticoagulants: Secondary | ICD-10-CM | POA: Diagnosis not present

## 2023-04-10 DIAGNOSIS — I700 Atherosclerosis of aorta: Secondary | ICD-10-CM | POA: Diagnosis not present

## 2023-04-10 DIAGNOSIS — K55069 Acute infarction of intestine, part and extent unspecified: Secondary | ICD-10-CM | POA: Insufficient documentation

## 2023-04-10 DIAGNOSIS — K449 Diaphragmatic hernia without obstruction or gangrene: Secondary | ICD-10-CM | POA: Diagnosis not present

## 2023-04-10 LAB — COMPREHENSIVE METABOLIC PANEL
ALT: 20 U/L (ref 0–44)
AST: 21 U/L (ref 15–41)
Albumin: 3.5 g/dL (ref 3.5–5.0)
Alkaline Phosphatase: 80 U/L (ref 38–126)
Anion gap: 8 (ref 5–15)
BUN: 16 mg/dL (ref 8–23)
CO2: 25 mmol/L (ref 22–32)
Calcium: 9 mg/dL (ref 8.9–10.3)
Chloride: 104 mmol/L (ref 98–111)
Creatinine, Ser: 0.82 mg/dL (ref 0.44–1.00)
GFR, Estimated: >60 mL/min (ref >60)
Glucose, Bld: 133 mg/dL — ABNORMAL HIGH (ref 70–99)
Potassium: 3.7 mmol/L (ref 3.5–5.1)
Sodium: 137 mmol/L (ref 135–145)
Total Bilirubin: 1.1 mg/dL (ref 0.3–1.2)
Total Protein: 7.1 g/dL (ref 6.5–8.1)

## 2023-04-10 LAB — CBC WITH DIFFERENTIAL/PLATELET
Abs Immature Granulocytes: 0.01 10*3/uL (ref 0.00–0.07)
Basophils Absolute: 0.1 10*3/uL (ref 0.0–0.1)
Basophils Relative: 1 %
Eosinophils Absolute: 0.2 10*3/uL (ref 0.0–0.5)
Eosinophils Relative: 3 %
HCT: 43.8 % (ref 36.0–46.0)
Hemoglobin: 14 g/dL (ref 12.0–15.0)
Immature Granulocytes: 0 %
Lymphocytes Relative: 25 %
Lymphs Abs: 1.9 10*3/uL (ref 0.7–4.0)
MCH: 28.7 pg (ref 26.0–34.0)
MCHC: 32 g/dL (ref 30.0–36.0)
MCV: 89.8 fL (ref 80.0–100.0)
Monocytes Absolute: 0.4 10*3/uL (ref 0.1–1.0)
Monocytes Relative: 5 %
Neutro Abs: 5.2 10*3/uL (ref 1.7–7.7)
Neutrophils Relative %: 66 %
Platelets: 277 10*3/uL (ref 150–400)
RBC: 4.88 MIL/uL (ref 3.87–5.11)
RDW: 14.5 % (ref 11.5–15.5)
WBC: 7.7 10*3/uL (ref 4.0–10.5)
nRBC: 0 % (ref 0.0–0.2)

## 2023-04-10 LAB — D-DIMER, QUANTITATIVE: D-Dimer, Quant: 0.44 ug/mL-FEU (ref 0.00–0.50)

## 2023-04-10 MED ORDER — IOHEXOL 350 MG/ML SOLN
100.0000 mL | Freq: Once | INTRAVENOUS | Status: AC | PRN
  Administered 2023-04-10: 100 mL via INTRAVENOUS

## 2023-04-11 ENCOUNTER — Other Ambulatory Visit: Payer: 59

## 2023-04-16 ENCOUNTER — Other Ambulatory Visit (HOSPITAL_COMMUNITY): Payer: Self-pay

## 2023-04-16 ENCOUNTER — Telehealth: Payer: Self-pay

## 2023-04-16 ENCOUNTER — Other Ambulatory Visit (HOSPITAL_COMMUNITY): Payer: Self-pay | Admitting: Orthopedic Surgery

## 2023-04-16 DIAGNOSIS — Z0001 Encounter for general adult medical examination with abnormal findings: Secondary | ICD-10-CM | POA: Diagnosis not present

## 2023-04-16 DIAGNOSIS — I81 Portal vein thrombosis: Secondary | ICD-10-CM | POA: Diagnosis not present

## 2023-04-16 DIAGNOSIS — M25561 Pain in right knee: Secondary | ICD-10-CM

## 2023-04-16 DIAGNOSIS — Z1389 Encounter for screening for other disorder: Secondary | ICD-10-CM | POA: Diagnosis not present

## 2023-04-16 DIAGNOSIS — R002 Palpitations: Secondary | ICD-10-CM | POA: Diagnosis not present

## 2023-04-16 DIAGNOSIS — I10 Essential (primary) hypertension: Secondary | ICD-10-CM | POA: Diagnosis not present

## 2023-04-16 DIAGNOSIS — M170 Bilateral primary osteoarthritis of knee: Secondary | ICD-10-CM | POA: Diagnosis not present

## 2023-04-16 NOTE — Telephone Encounter (Signed)
Patient Advocate Encounter   Received notification from [employer] Medicare Part D that prior authorization is required for Ubrelvy 50MG  tablets   Submitted: 04-16-2023 Key [id number]  Status is pending

## 2023-04-17 NOTE — Progress Notes (Signed)
[facility] 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 Eliquis following sleeve gastrectomy)  CURRENT THERAPY: Eliquis  INTERVAL HISTORY:   Kathryn Compton 72 y.o. 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  She was seen for initial consultation by Dr. Ellin Saba and Rojelio Brenner PA-C on 01/13/2023.  At today's visit, she reports feeling fair.  No recent hospitalizations, surgeries, or changes in baseline health status.  She has been compliant with Eliquis 5 mg twice daily.  She denies any major bleeding episodes. She denies any recurrent abdominal pain or nausea and vomiting. She has 70% energy and very low appetite. She endorses that she is maintaining a stable weight.  ASSESSMENT & PLAN:  1.  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 Sharl Ma) for hypercoagulable workup in the setting of acute thrombosis of SMV and portal vein branches. 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  Vascular surgeon felt no need for intervention.  Patient was treated with IV heparin while hospitalized and discharged with prescription for Lovenox 120 mg SQ twice daily for at least 3 months. - Once she was able to tolerate pills again (following sleeve gastrectomy), she was switched to Eliquis as of 01/13/2023.  She tolerated this well without any major bleeding events. - No prior DVT or PE.  Single miscarriage at [redacted] weeks  gestation. - Paternal cousin with DVT.  Maternal great grandmother with miscarriages x 3. - Hypercoagulable workup (01/13/2023) = NEGATIVE D-dimer 0.58 (mildly elevated) Negative factor V Leiden and PT gene mutation Negative lupus anticoagulant, cardiolipin antibodies, beta-2 glycoprotein antibodies JAK2 mutation negative - Repeat CTA abdomen/pelvis (04/10/2023): Resolution of portal vein and SMV thrombus - Most recent labs (04/10/2023): D-dimer normalized at 0.44.  CMP at baseline.  Normal CBC/differential. - DISCUSSION: Acute mesenteric vein thrombosis has been reported as postoperative complication of laparoscopic sleeve gastrectomy with incidence of about 0.7%.  It is imperative to identify patients with hypercoagulable conditions and treat them with long-term anticoagulation therapy.  Treatment for at least 3 to 6 months is recommended, but longer duration warranted if thrombophilic state is identified.  Study showed that following anticoagulation, most thrombosed veins partially or completely recanalized. - PLAN: Since hypercoagulable workup was negative and provoked SMV/portal vein thrombosis has resolved, patient can discontinue Eliquis.  We will tentatively discharge her to PCP, but she should return to hematology if she develops any recurrent VTE in the future.   2.  Health maintenance - Patient is up-to-date on colonoscopy, most recently done in 2020, which showed external hemorrhoids, internal hemorrhoids, and benign polyp x 1. - She is overdue for mammogram, which was last done in 2020 - PLAN: Patient educated on importance of complying with screening mammogram.   3.  Other history - PMH: Obesity, hypertension, anxiety/depression, GERD, arthritis, vertigo, seizures, and migraines.  - SOCIAL: She lives with her significant other. She denies any tobacco, alcohol, illicit drug use. She is retired from work as Actuary.  - FAMILY: Her  father's medical history is  unknown, but he is thought to be possibly deceased secondary to cancer. No malignancies on her maternal side of the family.    PLAN SUMMARY: >> Discharge from clinic      REVIEW OF SYSTEMS:   Review of Systems  Constitutional:  Positive for appetite change (s/p gastric sleeve) and fatigue. Negative for chills, diaphoresis, fever and unexpected weight change.  HENT:   Negative for lump/mass and nosebleeds.   Eyes:  Negative for eye problems.  Respiratory:  Negative for cough, hemoptysis and shortness of breath.   Cardiovascular:  Negative for chest pain, leg swelling and palpitations.  Gastrointestinal:  Negative for abdominal pain, blood in stool, constipation, diarrhea, nausea and vomiting.  Genitourinary:  Negative for hematuria.   Musculoskeletal:  Positive for back pain.  Skin: Negative.   Neurological:  Positive for dizziness (vertigo). Negative for headaches and light-headedness.  Hematological:  Does not bruise/bleed easily.  Psychiatric/Behavioral:  Positive for depression. The patient is nervous/anxious.      PHYSICAL EXAM:  ECOG PERFORMANCE STATUS: 1 - Symptomatic but completely ambulatory  There were no vitals filed for this visit. There were no vitals filed for this visit. Physical Exam Constitutional:      Appearance: Normal appearance. She is normal weight.  Cardiovascular:     Heart sounds: Normal heart sounds.  Pulmonary:     Breath sounds: Normal breath sounds.  Neurological:     General: No focal deficit present.     Mental Status: Mental status is at baseline.  Psychiatric:        Behavior: Behavior normal. Behavior is cooperative.     PAST MEDICAL/SURGICAL HISTORY:  Past Medical History:  Diagnosis Date   Anxiety    Arthritis    Depression    GERD (gastroesophageal reflux disease)    Headache    Hypertension    Motion sickness    Palpitations    Plantar fasciitis    Seizures (HCC)    Vertigo    Past Surgical History:  Procedure Laterality  Date   BREAST REDUCTION SURGERY     COLONOSCOPY N/A 09/06/2019   Procedure: COLONOSCOPY;  Surgeon: West Bali, MD;  Location: [facility];  Service: Endoscopy;  Laterality: N/A;  2:00   LAPAROSCOPIC GASTRIC SLEEVE RESECTION N/A 11/05/2022   Procedure: LAPAROSCOPIC SLEEVE GASTRECTOMY;  Surgeon: Sheliah Hatch De Blanch, MD;  Location: [facility] ORS;  Service: General;  Laterality: N/A;   POLYPECTOMY  09/06/2019   Procedure: POLYPECTOMY;  Surgeon: West Bali, MD;  Location: [facility];  Service: Endoscopy;;   REDUCTION MAMMAPLASTY     TUBAL LIGATION     UPPER GI ENDOSCOPY N/A 11/05/2022   Procedure: UPPER GI ENDOSCOPY;  Surgeon: Sheliah Hatch De Blanch, MD;  Location: [facility] ORS;  Service: General;  Laterality: N/A;    SOCIAL HISTORY:  Social History   Socioeconomic History   Marital status: Single    Spouse name: Not on file   Number of children: 2   Years of education: 12   Highest education level: High school graduate  Occupational History   Occupation: Retired  Tobacco Use   Smoking status: Never   Smokeless tobacco: Never  Vaping Use   Vaping Use: Never used  Substance and Sexual Activity   Alcohol use: Never   Drug use: Never   Sexual activity: Yes    Birth control/protection: Surgical    Comment: tubal  Other Topics Concern   Not on file  Social History Narrative  Retired Scientist, research (physical sciences). 2 kids(78, '82).   Right-handed.   Lives with significant other.   One cup caffeine per day, occasional glass of tea.   Social Determinants of Health   Financial Resource Strain: Medium Risk (08/01/2020)   Overall Financial Resource Strain (CARDIA)    Difficulty of Paying Living Expenses: Somewhat hard  Food Insecurity: No Food Insecurity (11/22/2022)   Hunger Vital Sign    Worried About Running Out of Food in the Last Year: Never true    Ran Out of Food in the Last Year: Never true  Transportation Needs: No Transportation Needs (11/22/2022)   PRAPARE -  Administrator, Civil Service (Medical): No    Lack of Transportation (Non-Medical): No  Physical Activity: Inactive (08/01/2020)   Exercise Vital Sign    Days of Exercise per Week: 0 days    Minutes of Exercise per Session: 0 min  Stress: Stress Concern Present (08/01/2020)   [employer] of Occupational Health - Occupational Stress Questionnaire    Feeling of Stress : Very much  Social Connections: Socially Isolated (08/01/2020)   Social Connection and Isolation Panel [NHANES]    Frequency of Communication with Friends and Family: More than three times a week    Frequency of Social Gatherings with Friends and Family: More than three times a week    Attends Religious Services: Never    Database administrator or Organizations: No    Attends Banker Meetings: Never    Marital Status: Divorced  Catering manager Violence: Not At Risk (11/22/2022)   Humiliation, Afraid, Rape, and Kick questionnaire    Fear of Current or Ex-Partner: No    Emotionally Abused: No    Physically Abused: No    Sexually Abused: No    FAMILY HISTORY:  Family History  Problem Relation Age of Onset   Other Mother        unsure of medical history   Other Father        unknown medical history   Drug abuse Brother    Suicidality Brother    Colon cancer Neg Hx     CURRENT MEDICATIONS:  Outpatient Encounter Medications as of 04/18/2023  Medication Sig   apixaban (ELIQUIS) 5 MG TABS tablet Take 1 tablet (5 mg total) by mouth 2 (two) times daily.   Cholecalciferol (VITAMIN D3) 50 MCG (2000 UT) TABS Take 2,000 Units by mouth daily.   citalopram (CELEXA) 20 MG tablet Take 20 mg by mouth daily as needed (for unresolved depression).   citalopram (CELEXA) 40 MG tablet Take 40 mg by mouth daily.   COLLAGEN PO Take 1 Scoop by mouth daily. Mix and drink as directed   Erenumab-aooe (AIMOVIG) 140 MG/ML SOAJ Inject 140 mg into the skin every 30 (thirty) days.   fluticasone (CUTIVATE) 0.05 %  cream Apply 1 application  topically daily as needed (for rashes).   hydrOXYzine (ATARAX) 25 MG tablet Take 6.25-12.5 mg by mouth every 6 (six) hours as needed for anxiety (or sleep).   ketotifen (ZADITOR) 0.035 % ophthalmic solution Place 1 drop into both eyes 2 (two) times daily as needed (itching).   Multiple Vitamins-Minerals (BARIATRIC FUSION) CHEW Chew 1 tablet by mouth daily.   pantoprazole (PROTONIX) 40 MG tablet Take 1 tablet (40 mg total) by mouth daily.   propranolol (INDERAL) 40 MG tablet Take 1 tablet (40 mg total) by mouth 2 (two) times daily.   TUMS ULTRA 1000 1000 MG chewable tablet Chew 1,000  mg by mouth in the morning and at bedtime.   Ubrogepant (UBRELVY) 50 MG TABS Take 1-2 tablets 30 minutes prior to MRI, may repeat once as needed. Must have driver.   Facility-Administered Encounter Medications as of 04/18/2023  Medication   ondansetron (ZOFRAN-ODT) disintegrating tablet 4 mg   Or   ondansetron (ZOFRAN) 4 mg in sodium chloride 0.9 % 50 mL IVPB   sodium chloride 0.9 % bolus 2,000 mL    ALLERGIES:  Allergies  Allergen Reactions   Other Itching and Other (See Comments)    Antidepressants-Itching, Gaining Weight Tolerates citalopram     LABORATORY DATA:  I have reviewed the labs as listed.  CBC    Component Value Date/Time   WBC 7.7 04/10/2023 1215   RBC 4.88 04/10/2023 1215   HGB 14.0 04/10/2023 1215   HCT 43.8 04/10/2023 1215   PLT 277 04/10/2023 1215   MCV 89.8 04/10/2023 1215   MCH 28.7 04/10/2023 1215   MCHC 32.0 04/10/2023 1215   RDW 14.5 04/10/2023 1215   LYMPHSABS 1.9 04/10/2023 1215   MONOABS 0.4 04/10/2023 1215   EOSABS 0.2 04/10/2023 1215   BASOSABS 0.1 04/10/2023 1215      Latest Ref Rng & Units 04/10/2023   12:15 PM 01/13/2023    3:05 PM 11/26/2022    6:08 AM  CMP  Glucose 70 - 99 mg/dL 161  87  89   BUN 8 - 23 mg/dL 16  16  <5   Creatinine 0.44 - 1.00 mg/dL 0.96  0.45  4.09   Sodium 135 - 145 mmol/L 137  140  141   Potassium 3.5 - 5.1  mmol/L 3.7  3.6  3.5   Chloride 98 - 111 mmol/L 104  106  107   CO2 22 - 32 mmol/L 25  25  27    Calcium 8.9 - 10.3 mg/dL 9.0  9.1  8.9   Total Protein 6.5 - 8.1 g/dL 7.1  7.0  6.2   Total Bilirubin 0.3 - 1.2 mg/dL 1.1  1.2  0.8   Alkaline Phos 38 - 126 U/L 80  82  60   AST 15 - 41 U/L 21  34  23   ALT 0 - 44 U/L 20  37  23     DIAGNOSTIC IMAGING:  I have independently reviewed the relevant imaging and discussed with the patient.   WRAP UP:  All questions were answered. The patient knows to call the clinic with any problems, questions or concerns.  Medical decision making: Low  Time spent on visit: I spent 15 minutes counseling the patient face to face. The total time spent in the appointment was 22 minutes and more than 50% was on counseling.  Carnella Guadalajara, PA-C  04/18/23 9:41 AM

## 2023-04-18 ENCOUNTER — Inpatient Hospital Stay (HOSPITAL_BASED_OUTPATIENT_CLINIC_OR_DEPARTMENT_OTHER): Payer: 59 | Admitting: Physician Assistant

## 2023-04-18 VITALS — BP 135/70 | HR 60 | Temp 98.3°F | Resp 17 | Ht 67.0 in | Wt 236.2 lb

## 2023-04-18 DIAGNOSIS — K55069 Acute infarction of intestine, part and extent unspecified: Secondary | ICD-10-CM

## 2023-04-18 DIAGNOSIS — R7989 Other specified abnormal findings of blood chemistry: Secondary | ICD-10-CM | POA: Diagnosis not present

## 2023-04-18 DIAGNOSIS — I81 Portal vein thrombosis: Secondary | ICD-10-CM

## 2023-04-18 DIAGNOSIS — I700 Atherosclerosis of aorta: Secondary | ICD-10-CM | POA: Diagnosis not present

## 2023-04-18 DIAGNOSIS — Z7901 Long term (current) use of anticoagulants: Secondary | ICD-10-CM | POA: Diagnosis not present

## 2023-04-18 NOTE — Patient Instructions (Signed)
[facility] at [facility] **VISIT SUMMARY & IMPORTANT INSTRUCTIONS **   You were seen today by Rojelio Brenner PA-C for your history of blood clots.   Your most recent CT scan showed that your blood clot has resolved. Your labs did not show any evidence of blood clotting disorder. Therefore, you can STOP taking your Eliquis. You do not need any further appointments with our office at this time, but if you have any blood clots in the future, please make an appointment to see [place] again.  ** Thank you for trusting me with your healthcare!  I strive to provide all of my patients with quality care at each visit.  If you receive a survey for this visit, I would be so grateful to you for taking the time to provide feedback.  Thank you in advance!  ~ Almendra Loria                   Dr. Doreatha Massed   &   Rojelio Brenner, PA-C   - - - - - - - - - - - - - - - - - -    Thank you for choosing [facility] at [facility] to provide your oncology and hematology care.  To afford each patient quality time with our provider, please arrive at least 15 minutes before your scheduled appointment time.   If you have a lab appointment with the Cancer Center please come in thru the Main Entrance and check in at the main information desk.  You need to re-schedule your appointment should you arrive 10 or more minutes late.  We strive to give you quality time with our providers, and arriving late affects you and other patients whose appointments are after yours.  Also, if you no show three or more times for appointments you may be dismissed from the clinic at the providers discretion.     Again, thank you for choosing [facility].  Our hope is that these requests will decrease the amount of time that you wait before being seen by our physicians.       _____________________________________________________________  Should you have questions after your visit  to [facility], please contact our office at [phone] and follow the prompts.  Our office hours are 8:00 a.m. and 4:30 p.m. Monday - Friday.  Please note that voicemails left after 4:00 p.m. may not be returned until the following business day.  We are closed weekends and major holidays.  You do have access to a nurse 24-7, just call the main number to the clinic [phone] and do not press any options, hold on the line and a nurse will answer the phone.    For prescription refill requests, have your pharmacy contact our office and allow 72 hours.

## 2023-04-21 ENCOUNTER — Other Ambulatory Visit: Payer: Self-pay | Admitting: *Deleted

## 2023-04-21 ENCOUNTER — Other Ambulatory Visit: Payer: Self-pay | Admitting: Cardiology

## 2023-04-21 MED ORDER — PROPRANOLOL HCL 40 MG PO TABS: 40.0000 mg | ORAL_TABLET | Freq: Two times a day (BID) | ORAL | 2 refills | Status: DC

## 2023-04-23 ENCOUNTER — Ambulatory Visit (INDEPENDENT_AMBULATORY_CARE_PROVIDER_SITE_OTHER): Payer: 59 | Admitting: Clinical

## 2023-04-23 DIAGNOSIS — F411 Generalized anxiety disorder: Secondary | ICD-10-CM

## 2023-04-23 DIAGNOSIS — Z658 Other specified problems related to psychosocial circumstances: Secondary | ICD-10-CM | POA: Diagnosis not present

## 2023-04-25 NOTE — BH Specialist Note (Signed)
Integrated Behavioral Health via Telemedicine Visit  04/25/2023 [name] [id number]  Number of Integrated Behavioral Health Clinician visits: Additional Visit  Session Start time: 1117   Session End time: 1147  Total time in minutes: 30   Referring Provider: Tinnie Gens, MD Patient/Family location: Home [facility] Provider location: Center for [facility]  All persons participating in visit: [name] and [name]   Types of Service: Individual psychotherapy and Telephone visit  I connected with [name] and/or [name]'s  n/a  via  Telephone or Video Enabled Telemedicine Application  (Video is Caregility application) and verified that I am speaking with the correct person using two identifiers. Discussed confidentiality: Yes   I discussed the limitations of telemedicine and the availability of in person appointments.  Discussed there is a possibility of technology failure and discussed alternative modes of communication if that failure occurs.  I discussed that engaging in this telemedicine visit, they consent to the provision of behavioral healthcare and the services will be billed under their insurance.  Patient and/or legal guardian expressed understanding and consented to Telemedicine visit: Yes   Presenting Concerns: Patient and/or family reports the following symptoms/concerns: Discouraged after hearing that she would need to switch to vegan diet to lose any more weight; processing emotions regarding this.  Duration of problem: Ongoing; Severity of problem: moderate  Patient and/or Family's Strengths/Protective Factors: Social connections, Concrete supports in place (healthy food, safe environments, etc.), Sense of purpose, and Physical Health (exercise, healthy diet, medication compliance, etc.)  Goals Addressed: Patient will:  Reduce symptoms of: anxiety and depression   Increase  knowledge and/or ability of: healthy habits   Progress towards Goals: Ongoing  Interventions: Interventions utilized:  Motivational Interviewing Standardized Assessments completed: Not Needed  Patient and/or Family Response: Patient agrees with treatment plan.   Assessment: Patient currently experiencing GAD; Psychosocial stress.   Patient may benefit from continued therapeutic intervention  .  Plan: Follow up with behavioral health clinician on : Three weeks Behavioral recommendations:  -Continue using previous self-coping strategies daily -Consider switching to one day/week vegan meals for the next week. If this is doable, consider to add a second day/week vegan meal day for the second and third weeks.  Referral(s): Integrated [employer] (In Clinic)  I discussed the assessment and treatment plan with the patient and/or parent/guardian. They were provided an opportunity to ask questions and all were answered. They agreed with the plan and demonstrated an understanding of the instructions.   They were advised to call back or seek an in-person evaluation if the symptoms worsen or if the condition fails to improve as anticipated.  [name] [name], LCSW     04/19/2022    1:47 PM 02/12/2022   11:22 AM 01/15/2022   10:55 AM 09/28/2021   10:25 AM 09/11/2021    1:17 PM  Depression screen PHQ 2/9  Decreased Interest 0 1 0 1 2  Down, Depressed, Hopeless 0 1 0 3 2  PHQ - 2 Score 0 2 0 4 4  Altered sleeping  0 0 3 3  Tired, decreased energy  1 1 3 3   Change in appetite  0 0 1 2  Feeling bad or failure about yourself   3 3 3 3   Trouble concentrating  3 3 0 0  Moving slowly or fidgety/restless  0 0 0 0  Suicidal thoughts  0 0 1   PHQ-9 Score  9 7 15  15      02/12/2022   11:24 AM 01/15/2022   10:58 AM 09/28/2021   10:23 AM 05/09/2021    3:28 PM  GAD 7 : Generalized Anxiety Score  Nervous, Anxious, on Edge 1 1 3 2   Control/stop worrying 0 0 0 2  Worry too much -  different things 2 0 2 3  Trouble relaxing 0 0 3 2  Restless 3 3 3 2   Easily annoyed or irritable 0 1 1 3   Afraid - awful might happen 0 0 0 2  Total GAD 7 Score 6 5 12  16

## 2023-04-28 NOTE — Telephone Encounter (Signed)
Pharmacy Patient Advocate Encounter  Received notification from [employer] that the request for prior authorization for Ubrelvy 50MG  Tablets has been denied due to see below.      Please be advised we currently do not have a Pharmacist to review denials, therefore you will need to process appeals accordingly as needed. Thanks for your support at this time.   You may call [phone] or fax [phone], to appeal.  Denial letter has been scanned into the chart.

## 2023-04-29 DIAGNOSIS — I81 Portal vein thrombosis: Secondary | ICD-10-CM | POA: Diagnosis not present

## 2023-04-29 DIAGNOSIS — Z0001 Encounter for general adult medical examination with abnormal findings: Secondary | ICD-10-CM | POA: Diagnosis not present

## 2023-04-29 DIAGNOSIS — I10 Essential (primary) hypertension: Secondary | ICD-10-CM | POA: Diagnosis not present

## 2023-04-29 DIAGNOSIS — E782 Mixed hyperlipidemia: Secondary | ICD-10-CM | POA: Diagnosis not present

## 2023-04-30 LAB — LIPID PANEL
Chol/HDL Ratio: 5.2 ratio — ABNORMAL HIGH (ref 0.0–4.4)
Cholesterol, Total: 155 mg/dL (ref 100–199)
HDL: 30 mg/dL — ABNORMAL LOW (ref >39)
LDL Chol Calc (NIH): 104 mg/dL — ABNORMAL HIGH (ref 0–99)
Triglycerides: 117 mg/dL (ref 0–149)
VLDL Cholesterol Cal: 21 mg/dL (ref 5–40)

## 2023-04-30 LAB — T4, FREE: Free T4: 1.11 ng/dL (ref 0.82–1.77)

## 2023-04-30 LAB — T3, FREE: T3, Free: 2.4 pg/mL (ref 2.0–4.4)

## 2023-04-30 LAB — TSH: TSH: 1.52 u[IU]/mL (ref 0.450–4.500)

## 2023-04-30 NOTE — Telephone Encounter (Signed)
I have placed an appeal letter in the work in doctor's in box for signature.

## 2023-05-01 NOTE — Telephone Encounter (Signed)
Appeal signed and faxed along with clinicals to Part D Appeal and Grievance Department.

## 2023-05-05 ENCOUNTER — Ambulatory Visit (INDEPENDENT_AMBULATORY_CARE_PROVIDER_SITE_OTHER): Payer: 59 | Admitting: "Endocrinology

## 2023-05-05 ENCOUNTER — Encounter: Payer: Self-pay | Admitting: "Endocrinology

## 2023-05-05 VITALS — BP 134/82 | HR 64 | Ht 67.0 in | Wt 237.6 lb

## 2023-05-05 DIAGNOSIS — E782 Mixed hyperlipidemia: Secondary | ICD-10-CM | POA: Diagnosis not present

## 2023-05-05 DIAGNOSIS — E669 Obesity, unspecified: Secondary | ICD-10-CM

## 2023-05-05 MED ORDER — WEGOVY 0.25 MG/0.5ML ~~LOC~~ SOAJ: 0.2500 mg | SUBCUTANEOUS | 2 refills | Status: DC

## 2023-05-05 NOTE — Progress Notes (Signed)
05/05/2023, 7:33 PM  Endocrinology follow-up note   Subjective:    Patient ID: Kathryn Compton, female    DOB: 06-19-1951, PCP Kathryn Compton   Past Medical History:  Diagnosis Date   Anxiety    Arthritis    Depression    GERD (gastroesophageal reflux disease)    Headache    Hypertension    Motion sickness    Palpitations    Plantar fasciitis    Seizures (HCC)    Vertigo    Past Surgical History:  Procedure Laterality Date   BREAST REDUCTION SURGERY     COLONOSCOPY N/A 09/06/2019   Procedure: COLONOSCOPY;  Surgeon: West Bali, Compton;  [facility];  Service: Endoscopy;  Laterality: N/A;  2:00   LAPAROSCOPIC GASTRIC SLEEVE RESECTION N/A 11/05/2022   Procedure: LAPAROSCOPIC SLEEVE GASTRECTOMY;  Surgeon: Sheliah Hatch, De Blanch, Compton;  Location: [facility];  Service: General;  Laterality: N/A;   POLYPECTOMY  09/06/2019   Procedure: POLYPECTOMY;  Surgeon: West Bali, Compton;  [facility]: [facility];  Service: Endoscopy;;   REDUCTION MAMMAPLASTY     TUBAL LIGATION     UPPER GI ENDOSCOPY N/A 11/05/2022   Procedure: UPPER GI ENDOSCOPY;  Surgeon: Sheliah Hatch De Blanch, Compton;  Location: [facility];  Service: General;  Laterality: N/A;   Social History   Socioeconomic History   Marital status: Single    Spouse name: Not on file   Number of children: 2   Years of education: 12   Highest education level: High school graduate  Occupational History   Occupation: Retired  Tobacco Use   Smoking status: Never   Smokeless tobacco: Never  Vaping Use   Vaping Use: Never used  Substance and Sexual Activity   Alcohol use: Never   Drug use: Never   Sexual activity: Yes    Birth control/protection: Surgical    Comment: tubal  Other Topics Concern   Not on file  Social History Narrative   Retired Scientist, research (physical sciences). 2 kids(78, '82).   Right-handed.   Lives with significant other.   One cup caffeine per  day, occasional glass of tea.   Social Determinants of Health   Financial Resource Strain: Medium Risk (08/01/2020)   Overall Financial Resource Strain (CARDIA)    Difficulty of Paying Living Expenses: Somewhat hard  Food Insecurity: No Food Insecurity (11/22/2022)   Hunger Vital Sign    Worried About Running Out of Food in the Last Year: Never true    Ran Out of Food in the Last Year: Never true  Transportation Needs: No Transportation Needs (11/22/2022)   PRAPARE - Administrator, Civil Service (Medical): No    Lack of Transportation (Non-Medical): No  Physical Activity: Inactive (08/01/2020)   Exercise Vital Sign    Days of Exercise per Week: 0 days    Minutes of Exercise per Session: 0 min  Stress: Stress Concern Present (08/01/2020)   Kathryn Compton of Occupational Health - Occupational Stress Questionnaire    Feeling of Stress : Very much  Social Connections: Socially Isolated (08/01/2020)   Social Connection and Isolation Panel [NHANES]    Frequency of Communication with Friends and Family: More than three times  a week    Frequency of Social Gatherings with Friends and Family: More than three times a week    Attends Religious Services: Never    Database administrator or Organizations: No    Attends Engineer, structural: Never    Marital Status: Divorced   Family History  Problem Relation Age of Onset   Other Mother        unsure of medical history   Other Father        unknown medical history   Drug abuse Brother    Suicidality Brother    Colon cancer Neg Hx    Outpatient Encounter Medications as of 05/05/2023  Medication Sig   Semaglutide-Weight Management (WEGOVY) 0.25 MG/0.5ML SOAJ Inject 0.25 mg into the skin once a week.   apixaban (ELIQUIS) 5 MG TABS tablet Take 1 tablet (5 mg total) by mouth 2 (two) times daily.   Cholecalciferol (VITAMIN D3) 50 MCG (2000 UT) TABS Take 2,000 Units by mouth daily.   citalopram (CELEXA) 40 MG tablet Take 40  mg by mouth daily.   COLLAGEN PO Take 1 Scoop by mouth daily. Mix and drink as directed   Erenumab-aooe (AIMOVIG) 140 MG/ML SOAJ Inject 140 mg into the skin every 30 (thirty) days.   fluticasone (CUTIVATE) 0.05 % cream Apply 1 application  topically daily as needed (for rashes).   hydrOXYzine (ATARAX) 25 MG tablet Take 6.25-12.5 mg by mouth every 6 (six) hours as needed for anxiety (or sleep).   ketotifen (ZADITOR) 0.035 % ophthalmic solution Place 1 drop into both eyes 2 (two) times daily as needed (itching).   Multiple Vitamins-Minerals (BARIATRIC FUSION) CHEW Chew 1 tablet by mouth daily.   pantoprazole (PROTONIX) 40 MG tablet Take 1 tablet (40 mg total) by mouth daily.   propranolol (INDERAL) 40 MG tablet Take 1 tablet (40 mg total) by mouth 2 (two) times daily.   TUMS ULTRA 1000 1000 MG chewable tablet Chew 1,000 mg by mouth in the morning and at bedtime.   Ubrogepant (UBRELVY) 50 MG TABS Take 1-2 tablets 30 minutes prior to MRI, may repeat once as needed. Must have driver.   Facility-Administered Encounter Medications as of 05/05/2023  Medication   ondansetron (ZOFRAN-ODT) disintegrating tablet 4 mg   Or   ondansetron (ZOFRAN) 4 mg in sodium chloride 0.9 % 50 mL IVPB   sodium chloride 0.9 % bolus 2,000 mL   ALLERGIES: Allergies  Allergen Reactions   Other Itching and Other (See Comments)    Antidepressants-Itching, Gaining Weight Tolerates citalopram     VACCINATION STATUS:  There is no immunization history on file for this patient.  HPI [name] is 72 y.o. female who presents today for follow-up after she was seen 2 years ago for weight management.  Kathryn Compton.   See notes from prior visits. -History is obtained directly from the patient as well as her chart review.  She reports that she has started gaining weight progressively over the last 3years associated with her decreased exercise routine due to plantar fasciitis and low back pain.  She also  stopped her usual dieting regimen.  This is her highest weight ever at 287 pounds.   Has an option of weight management, bariatric surgery was discussed with her during her last visit.  In the meantime, she underwent sleeve gastrectomy in December 2023. She subsequently lost close to 50 pounds, would like to achieve more.  She is a mother of 2 grown children.  She  does not have diabetes, thyroid dysfunction, any history of prolonged exposure to steroids.  She has hypertension not on medications at this time.      Review of Systems  Constitutional: + 50 pounds weight loss since last visit, + fatigue, no subjective hyperthermia, no subjective hypothermia Eyes: no blurry vision, no xerophthalmia   Objective:       05/05/2023    1:01 PM 04/18/2023    9:14 AM 02/25/2023    1:25 PM  Vitals with BMI  Height 5\' 7"  5\' 7"  5\' 7"   Weight 237 lbs 10 oz 236 lbs 3 oz 245 lbs  BMI 37.2 36.99 38.36  Systolic 134 135 161  Diastolic 82 70 79  Pulse 64 60 59    BP 134/82   Pulse 64   Ht 5\' 7"  (1.702 m)   Wt 237 lb 9.6 oz (107.8 kg)   BMI 37.21 kg/m   Wt Readings from Last 3 Encounters:  05/05/23 237 lb 9.6 oz (107.8 kg)  04/18/23 236 lb 3.2 oz (107.1 kg)  02/25/23 245 lb (111.1 kg)    Physical Exam  Constitutional:  Body mass index is 37.21 kg/m.,  not in acute distress, normal state of mind Eyes: PERRLA, EOMI, no exophthalmos    Recent Results (from the past 2160 hour(s))  Comprehensive metabolic panel     Status: Abnormal   Collection Time: 04/10/23 12:15 PM  Result Value Ref Range   Sodium 137 135 - 145 mmol/L   Potassium 3.7 3.5 - 5.1 mmol/L   Chloride 104 98 - 111 mmol/L   CO2 25 22 - 32 mmol/L   Glucose, Bld 133 (H) 70 - 99 mg/dL    Comment: Glucose reference range applies only to samples taken after fasting for at least 8 hours.   BUN 16 8 - 23 mg/dL   Creatinine, Ser 0.96 0.44 - 1.00 mg/dL   Calcium 9.0 8.9 - 04.5 mg/dL   Total Protein 7.1 6.5 - 8.1 g/dL   Albumin 3.5  3.5 - 5.0 g/dL   AST 21 15 - 41 U/L   ALT 20 0 - 44 U/L   Alkaline Phosphatase 80 38 - 126 U/L   Total Bilirubin 1.1 0.3 - 1.2 mg/dL   GFR, Estimated >40 >98 mL/min    Comment: (NOTE) Calculated using the CKD-EPI Creatinine Equation (2021)    Anion gap 8 5 - 15    Comment: Performed at [facility], [address], [place], [place] [address]  D-dimer, quantitative     Status: None   Collection Time: 04/10/23 12:15 PM  Result Value Ref Range   D-Dimer, Quant 0.44 0.00 - 0.50 ug/mL-FEU    Comment: (NOTE) At the manufacturer cut-off value of 0.5 g/mL FEU, this assay has a negative predictive value of 95-100%.This assay is intended for use in conjunction with a clinical pretest probability (PTP) assessment model to exclude pulmonary embolism (PE) and deep venous thrombosis (DVT) in outpatients suspected of PE or DVT. Results should be correlated with clinical presentation. Performed at [facility], [address] [place], [place] [address]   CBC with Differential/Platelet     Status: None   Collection Time: 04/10/23 12:15 PM  Result Value Ref Range   WBC 7.7 4.0 - 10.5 K/uL   RBC 4.88 3.87 - 5.11 MIL/uL   Hemoglobin 14.0 12.0 - 15.0 g/dL   HCT 62.1 30.8 - 65.7 %   MCV 89.8 80.0 - 100.0 fL   MCH 28.7 26.0 - 34.0  pg   MCHC 32.0 30.0 - 36.0 g/dL   RDW 11.9 14.7 - 82.9 %   Platelets 277 150 - 400 K/uL   nRBC 0.0 0.0 - 0.2 %   Neutrophils Relative % 66 %   Neutro Abs 5.2 1.7 - 7.7 K/uL   Lymphocytes Relative 25 %   Lymphs Abs 1.9 0.7 - 4.0 K/uL   Monocytes Relative 5 %   Monocytes Absolute 0.4 0.1 - 1.0 K/uL   Eosinophils Relative 3 %   Eosinophils Absolute 0.2 0.0 - 0.5 K/uL   Basophils Relative 1 %   Basophils Absolute 0.1 0.0 - 0.1 K/uL   Immature Granulocytes 0 %   Abs Immature Granulocytes 0.01 0.00 - 0.07 K/uL    Comment: Performed at [facility], [address] [place], [place] [address]  Lipid panel     Status: Abnormal   Collection Time: 04/29/23 12:32 PM  Result  Value Ref Range   Cholesterol, Total 155 100 - 199 mg/dL   Triglycerides 086 0 - 149 mg/dL   HDL 30 (L) >57 mg/dL   VLDL Cholesterol Cal 21 5 - 40 mg/dL   LDL Chol Calc (NIH) 846 (H) 0 - 99 mg/dL   Chol/HDL Ratio 5.2 (H) 0.0 - 4.4 ratio    Comment:                                   T. Chol/HDL Ratio                                             Men  Women                               1/2 Avg.Risk  3.4    3.3                                   Avg.Risk  5.0    4.4                                2X Avg.Risk  9.6    7.1                                3X Avg.Risk 23.4   11.0   TSH     Status: None   Collection Time: 04/29/23 12:32 PM  Result Value Ref Range   TSH 1.520 0.450 - 4.500 uIU/mL  T4, free     Status: None   Collection Time: 04/29/23 12:32 PM  Result Value Ref Range   Free T4 1.11 0.82 - 1.77 ng/dL  T3, free     Status: None   Collection Time: 04/29/23 12:32 PM  Result Value Ref Range   T3, Free 2.4 2.0 - 4.4 pg/mL     Assessment & Plan:   1.  Obesity with BMI of 37.2  2.  Dyslipidemia I have reviewed her recent labs indicating absence of thyroid dysfunction.  Her CMP is favorable.  She underwent sleeve gastrectomy in December 2023 which allowed her to lose overall  50 pounds.  She wants  to achieve more weight loss. In light of her metabolic dysfunction including dyslipidemia, she is a good candidate for lifestyle medicine.  - she acknowledges that there is a room for improvement in her food and drink choices. - Suggestion is made for her to avoid simple carbohydrates  from her diet including Cakes, Sweet Desserts, Ice Cream, Soda (diet and regular), Sweet Tea, Candies, Chips, Cookies, Store Bought Juices, Alcohol , Artificial Sweeteners,  Coffee Creamer, and "Sugar-free" Products, Lemonade. This will help patient to have more stable blood glucose profile and potentially avoid unintended weight gain.  The following Lifestyle Medicine recommendations according to American  College of Lifestyle Medicine  [facility]) were discussed and and offered to patient and she  agrees to start the journey:  A. Whole Foods, Plant-Based Nutrition comprising of fruits and vegetables, plant-based proteins, whole-grain carbohydrates was discussed in detail with the patient.   A list for source of those nutrients were also provided to the patient.  Patient will use only water or unsweetened tea for hydration. B.  The need to stay away from risky substances including alcohol, smoking; obtaining 7 to 9 hours of restorative sleep, at least 150 minutes of moderate intensity exercise weekly, the importance of healthy social connections,  and stress management techniques were discussed. C.  A full color page of  Calorie density of various food groups per pound showing examples of each food groups was provided to the patient.  If she gets adequate insurance coverage, she may benefit from GLP-1 receptor agonist.  I discussed and prescribed Wegovy 0.25 mg subcutaneously weekly.  This medication will be advanced if tolerated.  She mentions the fact that she cannot exercise optimally.  She will have point-of-care A1c next visit.  She will also have CMP, thyroid function test, B12 measurement during her next visit.   - she is advised to maintain close follow up with Kathryn Compton for primary care needs.   I spent  25  minutes in the care of the patient today including review of labs from Thyroid Function, CMP, and other relevant labs ; imaging/biopsy records (current and previous including abstractions from other facilities); face-to-face time discussing  her lab results and symptoms, medications doses, her options of short and long term treatment based on the latest standards of care / guidelines;   and documenting the encounter.  Kathryn Compton  participated in the discussions, expressed understanding, and voiced agreement with the above plans.  All questions were answered to her  satisfaction. she is encouraged to contact clinic should she have any questions or concerns prior to her return visit.   Follow up plan: Return in about 3 months (around 08/05/2023) for Fasting Labs  in AM B4 8, A1c -NV.   Marquis Lunch, Compton [facility] [address] [place], [place] [address] Phone: [phone]  Fax: [phone]     05/05/2023, 7:33 PM  This note was partially dictated with voice recognition software. Similar sounding words can be transcribed inadequately or may not  be corrected upon review.

## 2023-05-05 NOTE — Patient Instructions (Signed)

## 2023-05-09 ENCOUNTER — Ambulatory Visit (INDEPENDENT_AMBULATORY_CARE_PROVIDER_SITE_OTHER): Payer: 59 | Admitting: Clinical

## 2023-05-09 DIAGNOSIS — F411 Generalized anxiety disorder: Secondary | ICD-10-CM

## 2023-05-09 DIAGNOSIS — Z658 Other specified problems related to psychosocial circumstances: Secondary | ICD-10-CM

## 2023-05-13 ENCOUNTER — Encounter: Payer: 59 | Attending: General Surgery | Admitting: Skilled Nursing Facility1

## 2023-05-13 ENCOUNTER — Other Ambulatory Visit (HOSPITAL_COMMUNITY): Payer: Self-pay

## 2023-05-13 ENCOUNTER — Telehealth: Payer: Self-pay

## 2023-05-13 DIAGNOSIS — Z6837 Body mass index (BMI) 37.0-37.9, adult: Secondary | ICD-10-CM | POA: Diagnosis not present

## 2023-05-13 DIAGNOSIS — I10 Essential (primary) hypertension: Secondary | ICD-10-CM | POA: Diagnosis not present

## 2023-05-13 DIAGNOSIS — Z713 Dietary counseling and surveillance: Secondary | ICD-10-CM | POA: Insufficient documentation

## 2023-05-13 DIAGNOSIS — E669 Obesity, unspecified: Secondary | ICD-10-CM | POA: Insufficient documentation

## 2023-05-13 DIAGNOSIS — E875 Hyperkalemia: Secondary | ICD-10-CM | POA: Diagnosis not present

## 2023-05-13 NOTE — Telephone Encounter (Signed)
Patient Advocate Encounter   Received notification from [facility] that prior authorization is required for [facility]  Submitted: 05/13/23 Key [id number]  Status is pending

## 2023-05-14 ENCOUNTER — Other Ambulatory Visit: Payer: Self-pay | Admitting: "Endocrinology

## 2023-05-14 ENCOUNTER — Telehealth: Payer: Self-pay

## 2023-05-14 NOTE — Telephone Encounter (Signed)
Discussed with pt, understanding voiced. 

## 2023-05-14 NOTE — Telephone Encounter (Signed)
Pt called stating her insurance will not cover the Rx for wegovy. Pt requested a Rx for a replacement medication.

## 2023-05-16 NOTE — Telephone Encounter (Signed)
Pharmacy Patient Advocate Encounter  Received notification that the request for prior authorization has been denied      

## 2023-05-17 DIAGNOSIS — I10 Essential (primary) hypertension: Secondary | ICD-10-CM | POA: Diagnosis not present

## 2023-05-19 ENCOUNTER — Ambulatory Visit (HOSPITAL_COMMUNITY)
Admission: RE | Admit: 2023-05-19 | Discharge: 2023-05-19 | Disposition: A | Discharge status: Home / Self Care | Payer: 59 | Source: Ambulatory Visit | Attending: Internal Medicine | Admitting: Internal Medicine

## 2023-05-19 DIAGNOSIS — M25561 Pain in right knee: Secondary | ICD-10-CM | POA: Diagnosis not present

## 2023-05-19 DIAGNOSIS — M25461 Effusion, right knee: Secondary | ICD-10-CM | POA: Diagnosis not present

## 2023-05-19 DIAGNOSIS — M1711 Unilateral primary osteoarthritis, right knee: Secondary | ICD-10-CM | POA: Diagnosis not present

## 2023-05-20 ENCOUNTER — Encounter: Payer: Self-pay | Admitting: Skilled Nursing Facility1

## 2023-05-20 DIAGNOSIS — R1310 Dysphagia, unspecified: Secondary | ICD-10-CM | POA: Diagnosis not present

## 2023-05-20 DIAGNOSIS — Z903 Acquired absence of stomach [part of]: Secondary | ICD-10-CM | POA: Diagnosis not present

## 2023-05-20 DIAGNOSIS — Z9884 Bariatric surgery status: Secondary | ICD-10-CM | POA: Diagnosis not present

## 2023-05-20 NOTE — Progress Notes (Signed)
Follow-up visit:  Post-Operative 05/13/2023 Surgery  Medical Nutrition Therapy:  Appt start time: 6:00pm end time:  7:00pm  Primary concerns today: Post-operative Bariatric Surgery Nutrition Management 6 Month Post-Op Class  Surgery date: 11/05/2022 Surgery type: Sleeve Gastrectomy w/ hiatal hernia repair  NUTRITION ASSESSMENT  Anthropometrics Start Weight at [facility]: 267.9lbs  (04/19/22) Height: 67 inches Weight today:   Clinical Medications: see list Health/ medical history changes: ED visit; hospital stay for 6 days; hydration clinic visit  Body Composition Scale 02/20/2023  Weight  lbs 241.1  Total Body Fat  % 45.5     Visceral Fat 17  Fat-Free Mass  % 54.4     Total Body Water  % 41.7     Muscle-Mass  lbs 30.4  BMI 37.5  Body Fat Displacement ---        Torso  lbs 68.0        Left Leg  lbs 13.6        Right Leg  lbs 13.6        Left Arm  lbs 6.8        Right Arm  lbs 6.8     Information Reviewed/ Discussed During Appointment: -Review of composition scale numbers -Fluid requirements (64-100 ounces) -Protein requirements (60-80g) -Strategies for tolerating diet -Advancement of diet to include Starchy vegetables -Barriers to inclusion of new foods -Inclusion of appropriate multivitamin and calcium supplements  -Exercise recommendations   Fluid intake: adequate   Medications: See List Supplementation: appropriate   Using straws: no Drinking while eating: no Having you been chewing well: yes Chewing/swallowing difficulties: no Changes in vision: no Changes to mood/headaches: no Hair loss/Cahnges to skin/Changes to nails: no Any difficulty focusing or concentrating: no Sweating: no Dizziness/Lightheaded: no Palpitations: no  Carbonated beverages: no N/V/D/C/GAS: no Abdominal Pain: no Dumping syndrome: no  Recent physical activity:  ADL's  Progress Towards Goal(s):  In Progress Teaching method utilized: Visual & Auditory  Demonstrated degree of  understanding via: Teach Back  Readiness Level: Action Barriers to learning/adherence to lifestyle change: none identified  Handouts given during visit include: Phase V diet Progression  Goals Sheet The Benefits of Exercise are endless..... Support Group Topics    Teaching Method Utilized:  Visual Auditory Hands on  Demonstrated degree of understanding via:  Teach Back   Monitoring/Evaluation:  Dietary intake, exercise, and body weight. Follow up in 3 months for 9 month post-op visit.

## 2023-05-27 NOTE — BH Specialist Note (Signed)
Integrated Behavioral Health via Telemedicine Visit  06/02/2023 Kathryn Compton [id number]  Number of Integrated Behavioral Health Clinician visits: Additional Visit  Session Start time: 1115   Session End time: 1159  Total time in minutes: 44   Referring Provider: Tinnie Gens, MD Patient/Family location: Home [facility] Provider location: [facility] for [facility]  All persons participating in visit: [name] and [facility] Pattiann Solanki   Types of Service: Individual psychotherapy and Telephone visit  I connected with Kathryn Compton and/or Kathryn Compton's  n/a  via  Telephone or Video Enabled Telemedicine Application  (Video is Caregility application) and verified that I am speaking with the correct person using two identifiers. Discussed confidentiality: Yes   I discussed the limitations of telemedicine and the availability of in person appointments.  Discussed there is a possibility of technology failure and discussed alternative modes of communication if that failure occurs.  I discussed that engaging in this telemedicine visit, they consent to the provision of behavioral healthcare and the services will be billed under their insurance.  Patient and/or legal guardian expressed understanding and consented to Telemedicine visit: Yes   Presenting Concerns: Patient and/or family reports the following symptoms/concerns: Feeling "in a funk", attributed to ongoing life stressors, including communication issues in relationship and health issues (weight loss has stopped, hair falling out, physical pain).  Duration of problem: Ongoing; Severity of problem: moderate  Patient and/or Family's Strengths/Protective Factors: Social connections, Concrete supports in place (healthy food, safe environments, etc.), Sense of purpose, and Physical Health (exercise, healthy diet, medication compliance, etc.)  Goals Addressed: Patient will:  Reduce  symptoms of: anxiety, depression, and stress    Demonstrate ability to: Increase healthy adjustment to current life circumstances  Progress towards Goals: Ongoing  Interventions: Interventions utilized:  Motivational Interviewing and Supportive Reflection Standardized Assessments completed: Not Needed  Patient and/or Family Response: Patient agrees with treatment plan.   Assessment: Patient currently experiencing Generalized anxiety disorder.   Patient may benefit from continued therapeutic intervention  .  Plan: Follow up with behavioral health clinician on : Two weeks Behavioral recommendations:  -Continue using daily healthy self-care strategies and following medical provider recommendations for overall wellness -Continue keeping a practical mindset and maintaining a sense of humor in the midst of life struggles Referral(s): Integrated [employer] (In Clinic)  I discussed the assessment and treatment plan with the patient and/or parent/guardian. They were provided an opportunity to ask questions and all were answered. They agreed with the plan and demonstrated an understanding of the instructions.   They were advised to call back or seek an in-person evaluation if the symptoms worsen or if the condition fails to improve as anticipated.  [name] Seward Coran, LCSW     04/19/2022    1:47 PM 02/12/2022   11:22 AM 01/15/2022   10:55 AM 09/28/2021   10:25 AM 09/11/2021    1:17 PM  Depression screen PHQ 2/9  Decreased Interest 0 1 0 1 2  Down, Depressed, Hopeless 0 1 0 3 2  PHQ - 2 Score 0 2 0 4 4  Altered sleeping  0 0 3 3  Tired, decreased energy  1 1 3 3   Change in appetite  0 0 1 2  Feeling bad or failure about yourself   3 3 3 3   Trouble concentrating  3 3 0 0  Moving slowly or fidgety/restless  0 0 0 0  Suicidal thoughts  0 0 1  PHQ-9 Score  9 7 15 15       02/12/2022   11:24 AM 01/15/2022   10:58 AM 09/28/2021   10:23 AM 05/09/2021    3:28 PM  GAD 7 :  Generalized Anxiety Score  Nervous, Anxious, on Edge 1 1 3 2   Control/stop worrying 0 0 0 2  Worry too much - different things 2 0 2 3  Trouble relaxing 0 0 3 2  Restless 3 3 3 2   Easily annoyed or irritable 0 1 1 3   Afraid - awful might happen 0 0 0 2  Total GAD 7 Score 6 5 12  16

## 2023-05-28 DIAGNOSIS — M9902 Segmental and somatic dysfunction of thoracic region: Secondary | ICD-10-CM | POA: Diagnosis not present

## 2023-05-28 DIAGNOSIS — M9903 Segmental and somatic dysfunction of lumbar region: Secondary | ICD-10-CM | POA: Diagnosis not present

## 2023-05-28 DIAGNOSIS — M25561 Pain in right knee: Secondary | ICD-10-CM | POA: Diagnosis not present

## 2023-05-28 DIAGNOSIS — M9901 Segmental and somatic dysfunction of cervical region: Secondary | ICD-10-CM | POA: Diagnosis not present

## 2023-05-28 NOTE — Telephone Encounter (Signed)
Patient was called and made aware. She states that if she was not going to be taking the medication that she was not going to be coming back , ask that we cancel her next appointment. Dr. Fransico Him is aware.

## 2023-06-02 ENCOUNTER — Ambulatory Visit (INDEPENDENT_AMBULATORY_CARE_PROVIDER_SITE_OTHER): Payer: 59 | Admitting: Clinical

## 2023-06-02 DIAGNOSIS — Z658 Other specified problems related to psychosocial circumstances: Secondary | ICD-10-CM

## 2023-06-02 DIAGNOSIS — F411 Generalized anxiety disorder: Secondary | ICD-10-CM | POA: Diagnosis not present

## 2023-06-03 DIAGNOSIS — M170 Bilateral primary osteoarthritis of knee: Secondary | ICD-10-CM | POA: Diagnosis not present

## 2023-06-04 DIAGNOSIS — M9903 Segmental and somatic dysfunction of lumbar region: Secondary | ICD-10-CM | POA: Diagnosis not present

## 2023-06-04 DIAGNOSIS — M9901 Segmental and somatic dysfunction of cervical region: Secondary | ICD-10-CM | POA: Diagnosis not present

## 2023-06-04 DIAGNOSIS — M9902 Segmental and somatic dysfunction of thoracic region: Secondary | ICD-10-CM | POA: Diagnosis not present

## 2023-06-04 DIAGNOSIS — M25561 Pain in right knee: Secondary | ICD-10-CM | POA: Diagnosis not present

## 2023-06-04 NOTE — BH Specialist Note (Signed)
Integrated Behavioral Health via Telemedicine Visit  06/18/2023 Kathryn Compton [id number]  Number of Integrated Behavioral Health Clinician visits: Additional Visit  Session Start time: 1048   Session End time: 1115  Total time in minutes: 27   Referring Provider: Tinnie Gens, MD Patient/Family location: Home [facility] Provider location: Center for [facility] at [facility] for Women  All persons participating in visit: [name] and [facility] Kathryn Compton   Types of Service: Individual psychotherapy and Telephone visit  I connected with Kathryn Compton and/or Kathryn Compton's  n/a  via  Telephone or Video Enabled Telemedicine Application  (Video is Caregility application) and verified that I am speaking with the correct person using two identifiers. Discussed confidentiality: Yes   I discussed the limitations of telemedicine and the availability of in person appointments.  Discussed there is a possibility of technology failure and discussed alternative modes of communication if that failure occurs.  I discussed that engaging in this telemedicine visit, they consent to the provision of behavioral healthcare and the services will be billed under their insurance.  Patient and/or legal guardian expressed understanding and consented to Telemedicine visit: Yes   Presenting Concerns: Patient and/or family reports the following symptoms/concerns: Worry about health and partner returning home safely; processing feelings regarding estranged relationship with sons.   Duration of problem: Ongoing; Severity of problem: moderate  Patient and/or Family's Strengths/Protective Factors: Social connections, Concrete supports in place (healthy food, safe environments, etc.), Sense of purpose, and Physical Health (exercise, healthy diet, medication compliance, etc.)  Goals Addressed: Patient will:  Reduce symptoms of: anxiety, depression, and stress    Demonstrate  ability to: Increase motivation to adhere to plan of care  Progress towards Goals: Ongoing  Interventions: Interventions utilized:  Supportive Reflection Standardized Assessments completed: GAD-7 and PHQ 9  Patient and/or Family Response: Patient agrees with treatment plan.   Assessment: Patient currently experiencing Generalized anxiety disorder, Major depressive disorder, recurrent, moderate; Psychosocial stress.   Patient may benefit from continued therapeutic intervention  .  Plan: Follow up with behavioral health clinician on : Two weeks Behavioral recommendations:  -Continue plan to celebrate partner's return home tonight -Consider continuing efforts to reach out to sons and resuming self-coping strategies that have helped reduce anxiety and depression in the past Referral(s): Integrated [employer] (In Clinic)  I discussed the assessment and treatment plan with the patient and/or parent/guardian. They were provided an opportunity to ask questions and all were answered. They agreed with the plan and demonstrated an understanding of the instructions.   They were advised to call back or seek an in-person evaluation if the symptoms worsen or if the condition fails to improve as anticipated.  Rae Lips, LCSW     06/18/2023   11:04 AM 04/19/2022    1:47 PM 02/12/2022   11:22 AM 01/15/2022   10:55 AM 09/28/2021   10:25 AM  Depression screen PHQ 2/9  Decreased Interest 2 0 1 0 1  Down, Depressed, Hopeless 3 0 1 0 3  PHQ - 2 Score 5 0 2 0 4  Altered sleeping 0  0 0 3  Tired, decreased energy 3  1 1 3   Change in appetite 3  0 0 1  Feeling bad or failure about yourself  3  3 3 3   Trouble concentrating 0  3 3 0  Moving slowly or fidgety/restless 0  0 0 0  Suicidal thoughts 0  0 0 1  PHQ-9 Score 14  9  7 15      06/18/2023   11:07 AM 02/12/2022   11:24 AM 01/15/2022   10:58 AM 09/28/2021   10:23 AM  GAD 7 : Generalized Anxiety Score  Nervous, Anxious,  on Edge 0 1 1 3   Control/stop worrying 0 0 0 0  Worry too much - different things 3 2 0 2  Trouble relaxing 0 0 0 3  Restless 2 3 3 3   Easily annoyed or irritable 2 0 1 1  Afraid - awful might happen 2 0 0 0  Total GAD 7 Score 9 6 5  12

## 2023-06-10 ENCOUNTER — Other Ambulatory Visit (HOSPITAL_COMMUNITY): Payer: Self-pay | Admitting: Student

## 2023-06-10 DIAGNOSIS — Z903 Acquired absence of stomach [part of]: Secondary | ICD-10-CM

## 2023-06-11 DIAGNOSIS — M9903 Segmental and somatic dysfunction of lumbar region: Secondary | ICD-10-CM | POA: Diagnosis not present

## 2023-06-11 DIAGNOSIS — M9902 Segmental and somatic dysfunction of thoracic region: Secondary | ICD-10-CM | POA: Diagnosis not present

## 2023-06-11 DIAGNOSIS — M9901 Segmental and somatic dysfunction of cervical region: Secondary | ICD-10-CM | POA: Diagnosis not present

## 2023-06-11 DIAGNOSIS — M25561 Pain in right knee: Secondary | ICD-10-CM | POA: Diagnosis not present

## 2023-06-16 ENCOUNTER — Ambulatory Visit (HOSPITAL_COMMUNITY)
Admission: RE | Admit: 2023-06-16 | Discharge: 2023-06-16 | Disposition: A | Discharge status: Home / Self Care | Payer: 59 | Source: Ambulatory Visit | Attending: Student | Admitting: Student

## 2023-06-16 DIAGNOSIS — Z903 Acquired absence of stomach [part of]: Secondary | ICD-10-CM | POA: Insufficient documentation

## 2023-06-16 DIAGNOSIS — K219 Gastro-esophageal reflux disease without esophagitis: Secondary | ICD-10-CM | POA: Diagnosis not present

## 2023-06-16 DIAGNOSIS — R1310 Dysphagia, unspecified: Secondary | ICD-10-CM | POA: Diagnosis not present

## 2023-06-16 DIAGNOSIS — K449 Diaphragmatic hernia without obstruction or gangrene: Secondary | ICD-10-CM | POA: Diagnosis not present

## 2023-06-17 DIAGNOSIS — I890 Lymphedema, not elsewhere classified: Secondary | ICD-10-CM | POA: Diagnosis not present

## 2023-06-17 DIAGNOSIS — M170 Bilateral primary osteoarthritis of knee: Secondary | ICD-10-CM | POA: Diagnosis not present

## 2023-06-18 ENCOUNTER — Ambulatory Visit (INDEPENDENT_AMBULATORY_CARE_PROVIDER_SITE_OTHER): Payer: 59 | Admitting: Clinical

## 2023-06-18 DIAGNOSIS — F411 Generalized anxiety disorder: Secondary | ICD-10-CM

## 2023-06-18 DIAGNOSIS — F331 Major depressive disorder, recurrent, moderate: Secondary | ICD-10-CM | POA: Diagnosis not present

## 2023-06-18 DIAGNOSIS — Z658 Other specified problems related to psychosocial circumstances: Secondary | ICD-10-CM

## 2023-06-19 NOTE — BH Specialist Note (Signed)
Integrated Behavioral Health via Telemedicine Visit  07/03/2023 Kathryn Compton [id number]  Number of Integrated Behavioral Health Clinician visits: Additional Visit  Session Start time: 1034   Session End time: 1106  Total time in minutes: 32   Referring Provider: Tinnie Gens, MD Patient/Family location: Home [facility] Provider location: Center for [facility] at [facility] for [facility]  All persons participating in visit: [name] and [facility]   Types of Service: Individual psychotherapy and Telephone visit  I connected with Kathryn Compton and/or Kathryn Compton's  n/a  via  Telephone or Video Enabled Telemedicine Application  (Video is Caregility application) and verified that I am speaking with the correct person using two identifiers. Discussed confidentiality: Yes   I discussed the limitations of telemedicine and the availability of in person appointments.  Discussed there is a possibility of technology failure and discussed alternative modes of communication if that failure occurs.  I discussed that engaging in this telemedicine visit, they consent to the provision of behavioral healthcare and the services will be billed under their insurance.  Patient and/or legal guardian expressed understanding and consented to Telemedicine visit: Yes   Presenting Concerns: Patient and/or family reports the following symptoms/concerns: Conflicted feelings regarding hearing back from her son; some sadness, as well as hopeful with boyfriend starting new job soon, seeing pictures of her grandchildren and conversation with son; no pain in knee after acupuncture. Pt anticipates getting back to script-writing, a big life goal to complete.  Duration of problem: Ongoing; Severity of problem: moderate  Patient and/or Family's Strengths/Protective Factors: Social connections, Concrete supports in place (healthy food, safe environments, etc.), Sense of  purpose, and Physical Health (exercise, healthy diet, medication compliance, etc.)  Goals Addressed: Patient will:  Reduce symptoms of: anxiety, depression, and stress    Demonstrate ability to: Increase healthy adjustment to current life circumstances and Increase motivation to adhere to plan of care  Progress towards Goals: Ongoing  Interventions: Interventions utilized:  Supportive Reflection Standardized Assessments completed: Not Needed  Patient and/or Family Response: Patient agrees with treatment plan.   Assessment: Patient currently experiencing Generalized anxiety disorder.   Patient may benefit from continued therapeutic intervention  .  Plan: Follow up with behavioral health clinician on : Two weeks Behavioral recommendations:  -Continue plan to attend upcoming medical appointments, as well as continued acupuncture treatments as able -Continue plan to find out about "rider program" with partner's new job  -Continue keeping in touch with son and maintaining that connection  -Consider resuming daily self-coping strategies (outdoor walks, [employer], etc) as able Referral(s): Integrated [employer] (In Clinic)  I discussed the assessment and treatment plan with the patient and/or parent/guardian. They were provided an opportunity to ask questions and all were answered. They agreed with the plan and demonstrated an understanding of the instructions.   They were advised to call back or seek an in-person evaluation if the symptoms worsen or if the condition fails to improve as anticipated.  Rae Lips, LCSW     06/18/2023   11:04 AM 04/19/2022    1:47 PM 02/12/2022   11:22 AM 01/15/2022   10:55 AM 09/28/2021   10:25 AM  Depression screen PHQ 2/9  Decreased Interest 2 0 1 0 1  Down, Depressed, Hopeless 3 0 1 0 3  PHQ - 2 Score 5 0 2 0 4  Altered sleeping 0  0 0 3  Tired, decreased energy 3  1 1 3   Change in appetite  3  0 0 1  Feeling bad or failure  about yourself  3  3 3 3   Trouble concentrating 0  3 3 0  Moving slowly or fidgety/restless 0  0 0 0  Suicidal thoughts 0  0 0 1  PHQ-9 Score 14  9 7 15       06/18/2023   11:07 AM 02/12/2022   11:24 AM 01/15/2022   10:58 AM 09/28/2021   10:23 AM  GAD 7 : Generalized Anxiety Score  Nervous, Anxious, on Edge 0 1 1 3   Control/stop worrying 0 0 0 0  Worry too much - different things 3 2 0 2  Trouble relaxing 0 0 0 3  Restless 2 3 3 3   Easily annoyed or irritable 2 0 1 1  Afraid - awful might happen 2 0 0 0  Total GAD 7 Score 9 6 5  12

## 2023-06-20 DIAGNOSIS — Z903 Acquired absence of stomach [part of]: Secondary | ICD-10-CM | POA: Diagnosis not present

## 2023-06-20 DIAGNOSIS — E569 Vitamin deficiency, unspecified: Secondary | ICD-10-CM | POA: Diagnosis not present

## 2023-06-20 DIAGNOSIS — H25813 Combined forms of age-related cataract, bilateral: Secondary | ICD-10-CM | POA: Diagnosis not present

## 2023-06-20 DIAGNOSIS — L659 Nonscarring hair loss, unspecified: Secondary | ICD-10-CM | POA: Diagnosis not present

## 2023-06-20 DIAGNOSIS — K449 Diaphragmatic hernia without obstruction or gangrene: Secondary | ICD-10-CM | POA: Diagnosis not present

## 2023-06-20 DIAGNOSIS — K5900 Constipation, unspecified: Secondary | ICD-10-CM | POA: Diagnosis not present

## 2023-06-20 DIAGNOSIS — H31001 Unspecified chorioretinal scars, right eye: Secondary | ICD-10-CM | POA: Diagnosis not present

## 2023-06-20 DIAGNOSIS — H5203 Hypermetropia, bilateral: Secondary | ICD-10-CM | POA: Diagnosis not present

## 2023-06-20 DIAGNOSIS — R1310 Dysphagia, unspecified: Secondary | ICD-10-CM | POA: Diagnosis not present

## 2023-06-25 DIAGNOSIS — M9903 Segmental and somatic dysfunction of lumbar region: Secondary | ICD-10-CM | POA: Diagnosis not present

## 2023-06-25 DIAGNOSIS — M9902 Segmental and somatic dysfunction of thoracic region: Secondary | ICD-10-CM | POA: Diagnosis not present

## 2023-06-25 DIAGNOSIS — M25561 Pain in right knee: Secondary | ICD-10-CM | POA: Diagnosis not present

## 2023-06-25 DIAGNOSIS — M9901 Segmental and somatic dysfunction of cervical region: Secondary | ICD-10-CM | POA: Diagnosis not present

## 2023-06-28 ENCOUNTER — Other Ambulatory Visit: Payer: Self-pay | Admitting: Cardiology

## 2023-07-02 ENCOUNTER — Encounter: Payer: Self-pay | Admitting: Obstetrics & Gynecology

## 2023-07-02 ENCOUNTER — Other Ambulatory Visit (HOSPITAL_COMMUNITY)
Admission: RE | Admit: 2023-07-02 | Discharge: 2023-07-02 | Disposition: A | Discharge status: Home / Self Care | Payer: 59 | Source: Ambulatory Visit | Attending: Obstetrics & Gynecology | Admitting: Obstetrics & Gynecology

## 2023-07-02 ENCOUNTER — Ambulatory Visit (INDEPENDENT_AMBULATORY_CARE_PROVIDER_SITE_OTHER): Payer: 59 | Admitting: Obstetrics & Gynecology

## 2023-07-02 VITALS — BP 119/62 | HR 68 | Ht 67.0 in | Wt 240.0 lb

## 2023-07-02 DIAGNOSIS — Z0000 Encounter for general adult medical examination without abnormal findings: Secondary | ICD-10-CM

## 2023-07-02 DIAGNOSIS — M25561 Pain in right knee: Secondary | ICD-10-CM | POA: Diagnosis not present

## 2023-07-02 DIAGNOSIS — I10 Essential (primary) hypertension: Secondary | ICD-10-CM | POA: Diagnosis not present

## 2023-07-02 DIAGNOSIS — M9901 Segmental and somatic dysfunction of cervical region: Secondary | ICD-10-CM | POA: Diagnosis not present

## 2023-07-02 DIAGNOSIS — N951 Menopausal and female climacteric states: Secondary | ICD-10-CM | POA: Diagnosis not present

## 2023-07-02 DIAGNOSIS — Z1151 Encounter for screening for human papillomavirus (HPV): Secondary | ICD-10-CM | POA: Insufficient documentation

## 2023-07-02 DIAGNOSIS — M9902 Segmental and somatic dysfunction of thoracic region: Secondary | ICD-10-CM | POA: Diagnosis not present

## 2023-07-02 DIAGNOSIS — Z86718 Personal history of other venous thrombosis and embolism: Secondary | ICD-10-CM

## 2023-07-02 DIAGNOSIS — M9903 Segmental and somatic dysfunction of lumbar region: Secondary | ICD-10-CM | POA: Diagnosis not present

## 2023-07-02 DIAGNOSIS — Z01419 Encounter for gynecological examination (general) (routine) without abnormal findings: Secondary | ICD-10-CM | POA: Diagnosis present

## 2023-07-02 DIAGNOSIS — Z1231 Encounter for screening mammogram for malignant neoplasm of breast: Secondary | ICD-10-CM | POA: Diagnosis not present

## 2023-07-02 NOTE — Progress Notes (Addendum)
WELL-WOMAN EXAMINATION Patient name: Kathryn Compton MRN [id number]  Date of birth: 1951-03-28 Chief Complaint:   Gynecologic Exam (Pelvic exam)  History of Present Illness:   Kathryn Compton is a 72 y.o. [id number] PM female being seen today for a routine well-woman exam and the following concerns:  -Last visit noted considerable hot flashes and was going to try herbal supplements.  Today she reports actually a change in her hot flashes and states they have resolved.  Instead she notes occasional.'s of intense sheets that will be more prolonged.  The only resolution is that she comes home and showers.  She notes that she was seen by her endocrinologist and "nothing was wrong."  Denies vaginal bleeding, discharge, itching or irritation.  Denies pelvic or abdominal pain.  She does note some dyspareunia and is using lubricant with improvement.  Due to history of prior thrombosis, patient is not a candidate for estrogen therapy.  No LMP recorded. Patient is postmenopausal.  Last pap 07/2020- Pap neg, HPVpositive > colposcopy negative.  Last mammogram: to be done. Last colonoscopy: a few yrs ago     06/18/2023   11:04 AM 04/19/2022    1:47 PM 02/12/2022   11:22 AM 01/15/2022   10:55 AM 09/28/2021   10:25 AM  Depression screen PHQ 2/9  Decreased Interest 2 0 1 0 1  Down, Depressed, Hopeless 3 0 1 0 3  PHQ - 2 Score 5 0 2 0 4  Altered sleeping 0  0 0 3  Tired, decreased energy 3  1 1 3   Change in appetite 3  0 0 1  Feeling bad or failure about yourself  3  3 3 3   Trouble concentrating 0  3 3 0  Moving slowly or fidgety/restless 0  0 0 0  Suicidal thoughts 0  0 0 1  PHQ-9 Score 14  9 7 15       Review of Systems:   Pertinent items are noted in HPI Denies any headaches, blurred vision, fatigue, shortness of breath, chest pain, abdominal pain, bowel movements, urination, or intercourse unless otherwise stated above.  Pertinent History Reviewed:  Reviewed past medical,surgical,  social and family history.  Reviewed problem list, medications and allergies. Physical Assessment:   Vitals:   07/02/23 1100  BP: 119/62  Pulse: 68  Weight: 240 lb (108.9 kg)  Height: 5\' 7"  (1.702 m)  Body mass index is 37.59 kg/m.        Physical Examination:   General appearance - well appearing, and in no distress  Mental status - alert, oriented to person, place, and time  Psych:  She has a normal mood and affect  Skin - warm and dry, normal color, no suspicious lesions noted  Chest - effort normal, all lung fields clear to auscultation bilaterally  Heart - normal rate and regular rhythm  Neck:  midline trachea, no thyromegaly or nodules  Breasts - breasts appear normal, no suspicious masses, no skin or nipple changes or  axillary nodes  Abdomen -obese, soft, nontender, nondistended, no masses or organomegaly  Pelvic - VULVA: normal appearing vulva with no masses, tenderness or lesions  VAGINA: normal appearing vagina with normal color and discharge, no lesions  CERVIX: normal appearing cervix without discharge or lesions, no CMT  Thin prep pap is done with HR HPV cotesting  UTERUS: uterus is felt to be normal size, shape, consistency and nontender   ADNEXA: No adnexal masses or tenderness noted.  Extremities:  1+ edema, no calf  tenderness bilaterally  Chaperone: Faith Rogue     Assessment & Plan:  1) Well-Woman Exam -Pap collected, reviewed ASCCP guidelines -Further management pending results -Mammogram ordered  2) vasomotor symptoms -Due to contraindication to estrogen discussed Veozah -lab work reviewed, normal LFTs, Rx sent in -Plan to follow-up in 3 months  Meds ordered this encounter  Medications   Fezolinetant (VEOZAH) 45 MG TABS    Sig: Take 1 tablet (45 mg total) by mouth daily.    Dispense:  30 tablet    Refill:  11     Orders Placed This Encounter  Procedures   MM 3D SCREENING MAMMOGRAM BILATERAL BREAST    Follow-up: Return in about 3 months  (around 10/02/2023) for Medication follow up, with Dr. Charlotta Newton.   Myna Hidalgo, DO Attending Obstetrician & Gynecologist, [facility] for [employer], [facility]

## 2023-07-03 ENCOUNTER — Ambulatory Visit (INDEPENDENT_AMBULATORY_CARE_PROVIDER_SITE_OTHER): Payer: 59 | Admitting: Clinical

## 2023-07-03 DIAGNOSIS — F411 Generalized anxiety disorder: Secondary | ICD-10-CM

## 2023-07-07 MED ORDER — VEOZAH 45 MG PO TABS: 1.0000 | ORAL_TABLET | Freq: Every day | ORAL | 11 refills | Status: AC

## 2023-07-07 NOTE — Addendum Note (Signed)
Addended by: Sharon Seller on: 07/07/2023 08:28 AM   Modules accepted: Orders

## 2023-07-08 ENCOUNTER — Telehealth: Payer: Self-pay | Admitting: Obstetrics & Gynecology

## 2023-07-08 NOTE — Telephone Encounter (Signed)
Pt is waiting on a phone call about her test results. She is in need of medication for hot flashes.

## 2023-07-09 DIAGNOSIS — G8918 Other acute postprocedural pain: Secondary | ICD-10-CM | POA: Diagnosis not present

## 2023-07-09 DIAGNOSIS — M94261 Chondromalacia, right knee: Secondary | ICD-10-CM | POA: Diagnosis not present

## 2023-07-09 DIAGNOSIS — E559 Vitamin D deficiency, unspecified: Secondary | ICD-10-CM | POA: Diagnosis not present

## 2023-07-09 DIAGNOSIS — S83271A Complex tear of lateral meniscus, current injury, right knee, initial encounter: Secondary | ICD-10-CM | POA: Diagnosis not present
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
## 2023-07-11 ENCOUNTER — Telehealth: Payer: Self-pay

## 2023-07-11 NOTE — Telephone Encounter (Signed)
Patient would like for someone to call her about her [facility] prescription. She is wanting to know why it was sent to a pharmacy in [place].

## 2023-07-15 ENCOUNTER — Other Ambulatory Visit: Payer: Self-pay | Admitting: Cardiology

## 2023-07-17 DIAGNOSIS — B078 Other viral warts: Secondary | ICD-10-CM | POA: Diagnosis not present

## 2023-07-17 DIAGNOSIS — L820 Inflamed seborrheic keratosis: Secondary | ICD-10-CM | POA: Diagnosis not present

## 2023-07-18 DIAGNOSIS — I10 Essential (primary) hypertension: Secondary | ICD-10-CM | POA: Diagnosis not present

## 2023-07-23 ENCOUNTER — Ambulatory Visit (INDEPENDENT_AMBULATORY_CARE_PROVIDER_SITE_OTHER): Payer: 59 | Admitting: Clinical

## 2023-07-23 DIAGNOSIS — F411 Generalized anxiety disorder: Secondary | ICD-10-CM | POA: Diagnosis not present

## 2023-07-31 NOTE — BH Specialist Note (Signed)
Integrated Behavioral Health via Telemedicine Visit  08/14/2023 Kathryn Compton [id number]  Number of Integrated Behavioral Health Clinician visits: Additional Visit  Session Start time: 1049   Session End time: 1123  Total time in minutes: 34   Referring Provider: Tinnie Gens, MD Patient/Family location: Home [facility] Provider location: Center for [facility] at [facility] for Women  All persons participating in visit: [name] and [facility] Kathryn Compton   Types of Service: Individual psychotherapy and Telephone visit  I connected with Kathryn Compton and/or Kathryn Compton's  n/a  via  Telephone or Video Enabled Telemedicine Application  (Video is Caregility application) and verified that I am speaking with the correct person using two identifiers. Discussed confidentiality: Yes   I discussed the limitations of telemedicine and the availability of in person appointments.  Discussed there is a possibility of technology failure and discussed alternative modes of communication if that failure occurs.  I discussed that engaging in this telemedicine visit, they consent to the provision of behavioral healthcare and the services will be billed under their insurance.  Patient and/or legal guardian expressed understanding and consented to Telemedicine visit: Yes   Presenting Concerns: Patient and/or family reports the following symptoms/concerns: Increased worry over son's words/behaviors that are reminiscent of her brother prior to brother's suicide years ago; pt hopeful after reconnecting with son, as well as boyfriend's new job, that will help finance trip to see son and grandchildren in the future.  Duration of problem: Ongoing; Severity of problem: moderate  Patient and/or Family's Strengths/Protective Factors: Social connections, Concrete supports in place (healthy food, safe environments, etc.), Sense of purpose, and Physical Health (exercise, healthy  diet, medication compliance, etc.)  Goals Addressed: Patient will:  Reduce symptoms of: anxiety and depression    Demonstrate ability to: Increase motivation to adhere to plan of care  Progress towards Goals: Ongoing  Interventions: Interventions utilized:  Motivational Interviewing and Supportive Reflection Standardized Assessments completed: Not Needed  Patient and/or Family Response: Patient agrees with treatment plan.   Assessment: Patient currently experiencing Generalized anxiety disorder; Psychosocial stress.   Patient may benefit from continued therapeutic intervention  .  Plan: Follow up with behavioral health clinician on : Two weeks Behavioral recommendations:  -Continue communication with son; maintaining hope of a physical reunion prior to the end of 2024 -Continue daily self-coping strategies; attending upcoming medical appointments Referral(s): Integrated [employer] (In Clinic)  I discussed the assessment and treatment plan with the patient and/or parent/guardian. They were provided an opportunity to ask questions and all were answered. They agreed with the plan and demonstrated an understanding of the instructions.   They were advised to call back or seek an in-person evaluation if the symptoms worsen or if the condition fails to improve as anticipated.  Rae Lips, LCSW     06/18/2023   11:04 AM 04/19/2022    1:47 PM 02/12/2022   11:22 AM 01/15/2022   10:55 AM 09/28/2021   10:25 AM  Depression screen PHQ 2/9  Decreased Interest 2 0 1 0 1  Down, Depressed, Hopeless 3 0 1 0 3  PHQ - 2 Score 5 0 2 0 4  Altered sleeping 0  0 0 3  Tired, decreased energy 3  1 1 3   Change in appetite 3  0 0 1  Feeling bad or failure about yourself  3  3 3 3   Trouble concentrating 0  3 3 0  Moving slowly or fidgety/restless 0  0 0  0  Suicidal thoughts 0  0 0 1  PHQ-9 Score 14  9 7 15       06/18/2023   11:07 AM 02/12/2022   11:24 AM 01/15/2022    10:58 AM 09/28/2021   10:23 AM  GAD 7 : Generalized Anxiety Score  Nervous, Anxious, on Edge 0 1 1 3   Control/stop worrying 0 0 0 0  Worry too much - different things 3 2 0 2  Trouble relaxing 0 0 0 3  Restless 2 3 3 3   Easily annoyed or irritable 2 0 1 1  Afraid - awful might happen 2 0 0 0  Total GAD 7 Score 9 6 5  12

## 2023-08-05 DIAGNOSIS — M533 Sacrococcygeal disorders, not elsewhere classified: Secondary | ICD-10-CM | POA: Diagnosis not present

## 2023-08-12 ENCOUNTER — Ambulatory Visit: Payer: 59 | Admitting: "Endocrinology

## 2023-08-14 ENCOUNTER — Ambulatory Visit: Payer: 59 | Admitting: Skilled Nursing Facility1

## 2023-08-14 ENCOUNTER — Ambulatory Visit (INDEPENDENT_AMBULATORY_CARE_PROVIDER_SITE_OTHER): Payer: 59 | Admitting: Clinical

## 2023-08-14 ENCOUNTER — Encounter: Payer: Self-pay | Admitting: Obstetrics & Gynecology

## 2023-08-14 DIAGNOSIS — Z658 Other specified problems related to psychosocial circumstances: Secondary | ICD-10-CM

## 2023-08-14 DIAGNOSIS — F411 Generalized anxiety disorder: Secondary | ICD-10-CM

## 2023-08-18 DIAGNOSIS — I10 Essential (primary) hypertension: Secondary | ICD-10-CM | POA: Diagnosis not present

## 2023-08-18 NOTE — BH Specialist Note (Signed)
Integrated Behavioral Health via Telemedicine Visit  09/01/2023 Kathryn Compton [id number]  Number of Integrated Behavioral Health Clinician visits: Additional Visit  Session Start time: 1058   Session End time: 1136  Total time in minutes: 38  Referring Provider: Tinnie Gens, MD Patient/Family location: Home [facility] Provider location: Center for [facility] for Women  All persons participating in visit: [name] and [name]   Types of Service: Individual psychotherapy and Telephone visit  I connected with [name] and/or [name]'s  n/a  via  Telephone or Video Enabled Telemedicine Application  (Video is Caregility application) and verified that I am speaking with the correct person using two identifiers. Discussed confidentiality: Yes   I discussed the limitations of telemedicine and the availability of in person appointments.  Discussed there is a possibility of technology failure and discussed alternative modes of communication if that failure occurs.  I discussed that engaging in this telemedicine visit, they consent to the provision of behavioral healthcare and the services will be billed under their insurance.  Patient and/or legal guardian expressed understanding and consented to Telemedicine visit: Yes   Presenting Concerns: Patient and/or family reports the following symptoms/concerns:  Ongoing stress increasing anxious mood, attributed to attempts to sell partner's mother's home in [place] in the midst of family conflict.  Duration of problem: Ongoing; Severity of problem: moderate  Patient and/or Family's Strengths/Protective Factors: Social connections, Concrete supports in place (healthy food, safe environments, etc.), Sense of purpose, and Physical Health (exercise, healthy diet, medication compliance, etc.)  Goals Addressed: Patient will:  Reduce symptoms of: anxiety and depression     Demonstrate ability to: Increase healthy adjustment to current life circumstances  Progress towards Goals: Ongoing  Interventions: Interventions utilized:  Supportive Reflection Standardized Assessments completed: Not Needed  Patient and/or Family Response: Patient agrees with treatment plan.   Assessment: Patient currently experiencing Generalized anxiety disorder; Psychosocial stress.   Patient may benefit from continued therapeutic intervention  .  Plan: Follow up with behavioral health clinician on : Two weeks Behavioral recommendations:  -Continue taking Celexa as prescribed -Continue daily self-coping strategies, attending upcoming medical appointments -Consider additional legal aid resources [facility] and/or [place], as needed)  Referral(s): Integrated [employer] (In Clinic)  I discussed the assessment and treatment plan with the patient and/or parent/guardian. They were provided an opportunity to ask questions and all were answered. They agreed with the plan and demonstrated an understanding of the instructions.   They were advised to call back or seek an in-person evaluation if the symptoms worsen or if the condition fails to improve as anticipated.  Rae Lips, LCSW     06/18/2023   11:04 AM 04/19/2022    1:47 PM 02/12/2022   11:22 AM 01/15/2022   10:55 AM 09/28/2021   10:25 AM  Depression screen PHQ 2/9  Decreased Interest 2 0 1 0 1  Down, Depressed, Hopeless 3 0 1 0 3  PHQ - 2 Score 5 0 2 0 4  Altered sleeping 0  0 0 3  Tired, decreased energy 3  1 1 3   Change in appetite 3  0 0 1  Feeling bad or failure about yourself  3  3 3 3   Trouble concentrating 0  3 3 0  Moving slowly or fidgety/restless 0  0 0 0  Suicidal thoughts 0  0 0 1  PHQ-9 Score 14  9 7 15       06/18/2023  11:07 AM 02/12/2022   11:24 AM 01/15/2022   10:58 AM 09/28/2021   10:23 AM  GAD 7 : Generalized Anxiety Score  Nervous, Anxious, on Edge 0 1 1 3   Control/stop  worrying 0 0 0 0  Worry too much - different things 3 2 0 2  Trouble relaxing 0 0 0 3  Restless 2 3 3 3   Easily annoyed or irritable 2 0 1 1  Afraid - awful might happen 2 0 0 0  Total GAD 7 Score 9 6 5  12

## 2023-09-01 ENCOUNTER — Ambulatory Visit (INDEPENDENT_AMBULATORY_CARE_PROVIDER_SITE_OTHER): Payer: 59 | Admitting: Clinical

## 2023-09-01 DIAGNOSIS — F411 Generalized anxiety disorder: Secondary | ICD-10-CM | POA: Diagnosis not present

## 2023-09-01 DIAGNOSIS — Z658 Other specified problems related to psychosocial circumstances: Secondary | ICD-10-CM

## 2023-09-01 NOTE — Patient Instructions (Signed)
Center for [facility] at [facility] for Women [address] [place], [place] [address] [phone] (main office) [phone] [facility] office)   Legal Aid [place] [email or web address]  Legal [facility] [email or web address] [phone]

## 2023-09-05 NOTE — BH Specialist Note (Signed)
Integrated Behavioral Health via Telemedicine Visit  09/05/2023 Kathryn Compton [id number]  Number of Integrated Behavioral Health Clinician visits: Additional Visit  Session Start time: 1058   Session End time: 1136  Total time in minutes: 38   Referring Provider: Tinnie Gens, MD Patient/Family location: Home [facility] Provider location: Center for [facility] at [facility] for Women  All persons participating in visit: [name] and [facility] Kellan Raffield   Types of Service: Individual psychotherapy and Telephone visit  I connected with Kathryn Compton and/or Kathryn Compton's  n/a  via  Telephone or Video Enabled Telemedicine Application  (Video is Caregility application) and verified that I am speaking with the correct person using two identifiers. Discussed confidentiality: Yes   I discussed the limitations of telemedicine and the availability of in person appointments.  Discussed there is a possibility of technology failure and discussed alternative modes of communication if that failure occurs.  I discussed that engaging in this telemedicine visit, they consent to the provision of behavioral healthcare and the services will be billed under their insurance.  Patient and/or legal guardian expressed understanding and consented to Telemedicine visit: Yes   Presenting Concerns: Patient and/or family reports the following symptoms/concerns: Processing current  life stress. Duration of problem: Ongoing; Severity of problem: moderate  Patient and/or Family's Strengths/Protective Factors: Social connections, Concrete supports in place (healthy food, safe environments, etc.), Sense of purpose, and Physical Health (exercise, healthy diet, medication compliance, etc.)  Goals Addressed: Patient will:  Maintain reduction symptoms of: anxiety, depression, and stress   Demonstrate ability to: Increase healthy adjustment to current life  circumstances  Progress towards Goals: Ongoing  Interventions: Interventions utilized:  Supportive Reflection Standardized Assessments completed: Not Needed  Patient and/or Family Response: Patient agrees with treatment plan.   Assessment: Patient currently experiencing Generalized anxiety disorder; Psychosocial stress.   Patient may benefit from continued therapeutic intervention.  Plan: Follow up with behavioral health clinician on : Two weeks Behavioral recommendations:  -Continue Celexa as prescribed; daily self-coping strategies -Begin plan to start back on script again asap -Continue plan to start back doing outdoor walks with boyfriend on his time off work; continue keeping up communication with son Referral(s): Integrated [employer] (In Clinic)  I discussed the assessment and treatment plan with the patient and/or parent/guardian. They were provided an opportunity to ask questions and all were answered. They agreed with the plan and demonstrated an understanding of the instructions.   They were advised to call back or seek an in-person evaluation if the symptoms worsen or if the condition fails to improve as anticipated.  Rae Lips, LCSW     06/18/2023   11:04 AM 04/19/2022    1:47 PM 02/12/2022   11:22 AM 01/15/2022   10:55 AM 09/28/2021   10:25 AM  Depression screen PHQ 2/9  Decreased Interest 2 0 1 0 1  Down, Depressed, Hopeless 3 0 1 0 3  PHQ - 2 Score 5 0 2 0 4  Altered sleeping 0  0 0 3  Tired, decreased energy 3  1 1 3   Change in appetite 3  0 0 1  Feeling bad or failure about yourself  3  3 3 3   Trouble concentrating 0  3 3 0  Moving slowly or fidgety/restless 0  0 0 0  Suicidal thoughts 0  0 0 1  PHQ-9 Score 14  9 7 15       06/18/2023   11:07 AM 02/12/2022  11:24 AM 01/15/2022   10:58 AM 09/28/2021   10:23 AM  GAD 7 : Generalized Anxiety Score  Nervous, Anxious, on Edge 0 1 1 3   Control/stop worrying 0 0 0 0  Worry too much  - different things 3 2 0 2  Trouble relaxing 0 0 0 3  Restless 2 3 3 3   Easily annoyed or irritable 2 0 1 1  Afraid - awful might happen 2 0 0 0  Total GAD 7 Score 9 6 5  12

## 2023-09-11 DIAGNOSIS — M545 Low back pain, unspecified: Secondary | ICD-10-CM | POA: Diagnosis not present

## 2023-09-11 DIAGNOSIS — M47816 Spondylosis without myelopathy or radiculopathy, lumbar region: Secondary | ICD-10-CM | POA: Diagnosis not present

## 2023-09-17 DIAGNOSIS — I10 Essential (primary) hypertension: Secondary | ICD-10-CM | POA: Diagnosis not present

## 2023-09-19 ENCOUNTER — Ambulatory Visit (INDEPENDENT_AMBULATORY_CARE_PROVIDER_SITE_OTHER): Payer: 59 | Admitting: Clinical

## 2023-09-19 DIAGNOSIS — Z658 Other specified problems related to psychosocial circumstances: Secondary | ICD-10-CM

## 2023-09-19 DIAGNOSIS — F411 Generalized anxiety disorder: Secondary | ICD-10-CM

## 2023-10-01 NOTE — BH Specialist Note (Signed)
Pt did not arrive to phone visit; Left HIPPA-compliant message to call back Asher Muir from [employer] for [employer] at [facility] for Women at  [phone] [facility] office).  ; left MyChart message for patient.

## 2023-10-09 DIAGNOSIS — G43009 Migraine without aura, not intractable, without status migrainosus: Secondary | ICD-10-CM | POA: Diagnosis not present

## 2023-10-09 DIAGNOSIS — I10 Essential (primary) hypertension: Secondary | ICD-10-CM | POA: Diagnosis not present

## 2023-10-09 DIAGNOSIS — M170 Bilateral primary osteoarthritis of knee: Secondary | ICD-10-CM | POA: Diagnosis not present

## 2023-10-15 ENCOUNTER — Ambulatory Visit: Payer: 59 | Admitting: Clinical

## 2023-10-29 DIAGNOSIS — Z4789 Encounter for other orthopedic aftercare: Secondary | ICD-10-CM | POA: Diagnosis not present

## 2023-11-06 DIAGNOSIS — K449 Diaphragmatic hernia without obstruction or gangrene: Secondary | ICD-10-CM | POA: Diagnosis not present

## 2023-11-06 DIAGNOSIS — L659 Nonscarring hair loss, unspecified: Secondary | ICD-10-CM | POA: Diagnosis not present

## 2023-11-06 DIAGNOSIS — E569 Vitamin deficiency, unspecified: Secondary | ICD-10-CM | POA: Diagnosis not present

## 2023-11-06 DIAGNOSIS — Z903 Acquired absence of stomach [part of]: Secondary | ICD-10-CM | POA: Diagnosis not present

## 2023-11-07 NOTE — BH Specialist Note (Signed)
Integrated Behavioral Health via Telemedicine Visit  11/21/2023 Kathryn Compton [id number]  Number of Integrated Behavioral Health Clinician visits: Additional Visit  Session Start time: 0920   Session End time: 1003  Total time in minutes: 43   Referring Provider: Tinnie Gens, MD Patient/Family location: Home [facility] Provider location: Center for [facility] at [facility] for Women  All persons participating in visit: [name] and [facility] Tenae Graziosi   Types of Service: Individual psychotherapy  I connected with [name] and/or Vernona Rieger Whittingham's  n/a  via  Telephone or Video Enabled Telemedicine Application  (Video is Caregility application) and verified that I am speaking with the correct person using two identifiers. Discussed confidentiality: Yes   I discussed the limitations of telemedicine and the availability of in person appointments.  Discussed there is a possibility of technology failure and discussed alternative modes of communication if that failure occurs.  I discussed that engaging in this telemedicine visit, they consent to the provision of behavioral healthcare and the services will be billed under their insurance.  Patient and/or legal guardian expressed understanding and consented to Telemedicine visit: Yes   Presenting Concerns: Patient and/or family reports the following symptoms/concerns: Processing recent relationship difficulty, along with other life stressors with anxiety increase.  Duration of problem: Ongoing; Severity of problem: moderate  Patient and/or Family's Strengths/Protective Factors: Social connections, Social and Emotional competence, Concrete supports in place (healthy food, safe environments, etc.), Sense of purpose, and Physical Health (exercise, healthy diet, medication compliance, etc.)  Goals Addressed: Patient will:  Reduce symptoms of: anxiety and stress    Demonstrate ability to: Increase  healthy adjustment to current life circumstances  Progress towards Goals: Ongoing  Interventions: Interventions utilized:  Supportive Reflection Standardized Assessments completed: Not Needed  Patient and/or Family Response: Patient agrees with treatment plan.   Assessment: Patient currently experiencing Generalized anxiety disorder; Psychosocial stress.   Patient may benefit from psychoeducation and brief therapeutic interventions regarding coping with symptoms of anxiety and life stress .  Plan: Follow up with behavioral health clinician on : Three weeks Behavioral recommendations:  -Continue using daily self-coping strategies to manage current life stress and anxious thoughts -Continue taking Celexa as prescribed Referral(s): Integrated [employer] (In Clinic)  I discussed the assessment and treatment plan with the patient and/or parent/guardian. They were provided an opportunity to ask questions and all were answered. They agreed with the plan and demonstrated an understanding of the instructions.   They were advised to call back or seek an in-person evaluation if the symptoms worsen or if the condition fails to improve as anticipated.  Rae Lips, LCSW     06/18/2023   11:04 AM 04/19/2022    1:47 PM 02/12/2022   11:22 AM 01/15/2022   10:55 AM 09/28/2021   10:25 AM  Depression screen PHQ 2/9  Decreased Interest 2 0 1 0 1  Down, Depressed, Hopeless 3 0 1 0 3  PHQ - 2 Score 5 0 2 0 4  Altered sleeping 0  0 0 3  Tired, decreased energy 3  1 1 3   Change in appetite 3  0 0 1  Feeling bad or failure about yourself  3  3 3 3   Trouble concentrating 0  3 3 0  Moving slowly or fidgety/restless 0  0 0 0  Suicidal thoughts 0  0 0 1  PHQ-9 Score 14  9 7 15       06/18/2023   11:07 AM 02/12/2022  11:24 AM 01/15/2022   10:58 AM 09/28/2021   10:23 AM  GAD 7 : Generalized Anxiety Score  Nervous, Anxious, on Edge 0 1 1 3   Control/stop worrying 0 0 0 0   Worry too much - different things 3 2 0 2  Trouble relaxing 0 0 0 3  Restless 2 3 3 3   Easily annoyed or irritable 2 0 1 1  Afraid - awful might happen 2 0 0 0  Total GAD 7 Score 9 6 5  12

## 2023-11-08 DIAGNOSIS — I10 Essential (primary) hypertension: Secondary | ICD-10-CM | POA: Diagnosis not present

## 2023-11-21 ENCOUNTER — Ambulatory Visit: Payer: 59 | Admitting: Clinical

## 2023-11-21 DIAGNOSIS — F411 Generalized anxiety disorder: Secondary | ICD-10-CM

## 2023-11-21 DIAGNOSIS — Z658 Other specified problems related to psychosocial circumstances: Secondary | ICD-10-CM

## 2023-12-01 DIAGNOSIS — L659 Nonscarring hair loss, unspecified: Secondary | ICD-10-CM | POA: Diagnosis not present

## 2023-12-01 DIAGNOSIS — Z903 Acquired absence of stomach [part of]: Secondary | ICD-10-CM | POA: Diagnosis not present

## 2023-12-01 DIAGNOSIS — E569 Vitamin deficiency, unspecified: Secondary | ICD-10-CM | POA: Diagnosis not present

## 2023-12-09 DIAGNOSIS — I10 Essential (primary) hypertension: Secondary | ICD-10-CM | POA: Diagnosis not present

## 2023-12-11 ENCOUNTER — Other Ambulatory Visit: Payer: Self-pay

## 2023-12-11 ENCOUNTER — Encounter (HOSPITAL_COMMUNITY): Payer: Self-pay | Admitting: Emergency Medicine

## 2023-12-11 ENCOUNTER — Emergency Department (HOSPITAL_COMMUNITY)
Admission: EM | Admit: 2023-12-11 | Discharge: 2023-12-12 | Disposition: A | Discharge status: Home / Self Care | Payer: 59 | Attending: Emergency Medicine | Admitting: Emergency Medicine

## 2023-12-11 ENCOUNTER — Emergency Department (HOSPITAL_COMMUNITY): Payer: 59

## 2023-12-11 DIAGNOSIS — R1033 Periumbilical pain: Secondary | ICD-10-CM | POA: Diagnosis not present

## 2023-12-11 DIAGNOSIS — I10 Essential (primary) hypertension: Secondary | ICD-10-CM | POA: Diagnosis not present

## 2023-12-11 DIAGNOSIS — I700 Atherosclerosis of aorta: Secondary | ICD-10-CM | POA: Diagnosis not present

## 2023-12-11 DIAGNOSIS — R109 Unspecified abdominal pain: Secondary | ICD-10-CM | POA: Diagnosis not present

## 2023-12-11 LAB — COMPREHENSIVE METABOLIC PANEL
ALT: 15 U/L (ref 0–44)
AST: 16 U/L (ref 15–41)
Albumin: 3.5 g/dL (ref 3.5–5.0)
Alkaline Phosphatase: 78 U/L (ref 38–126)
Anion gap: 6 (ref 5–15)
BUN: 21 mg/dL (ref 8–23)
CO2: 27 mmol/L (ref 22–32)
Calcium: 9 mg/dL (ref 8.9–10.3)
Chloride: 103 mmol/L (ref 98–111)
Creatinine, Ser: 0.8 mg/dL (ref 0.44–1.00)
GFR, Estimated: >60 mL/min (ref >60)
Glucose, Bld: 130 mg/dL — ABNORMAL HIGH (ref 70–99)
Potassium: 3.9 mmol/L (ref 3.5–5.1)
Sodium: 136 mmol/L (ref 135–145)
Total Bilirubin: 0.4 mg/dL (ref 0.0–1.2)
Total Protein: 7 g/dL (ref 6.5–8.1)

## 2023-12-11 LAB — LIPASE, BLOOD: Lipase: 45 U/L (ref 11–51)

## 2023-12-11 LAB — CBC
HCT: 44 % (ref 36.0–46.0)
Hemoglobin: 13.9 g/dL (ref 12.0–15.0)
MCH: 27.7 pg (ref 26.0–34.0)
MCHC: 31.6 g/dL (ref 30.0–36.0)
MCV: 87.8 fL (ref 80.0–100.0)
Platelets: 347 10*3/uL (ref 150–400)
RBC: 5.01 MIL/uL (ref 3.87–5.11)
RDW: 15.7 % — ABNORMAL HIGH (ref 11.5–15.5)
WBC: 11.1 10*3/uL — ABNORMAL HIGH (ref 4.0–10.5)
nRBC: 0 % (ref 0.0–0.2)

## 2023-12-11 MED ORDER — IOHEXOL 350 MG/ML SOLN
100.0000 mL | Freq: Once | INTRAVENOUS | Status: AC | PRN
  Administered 2023-12-11: 100 mL via INTRAVENOUS

## 2023-12-11 MED ORDER — HYDROMORPHONE HCL 1 MG/ML IJ SOLN
0.5000 mg | Freq: Once | INTRAMUSCULAR | Status: AC
  Administered 2023-12-11: 0.5 mg via INTRAVENOUS
  Filled 2023-12-11: qty 0.5

## 2023-12-11 NOTE — ED Triage Notes (Signed)
 Pt in with sharp abdominal pain that radiates across upper abdomen, onset 2hrs ago. Hx of portal vein thrombosis 1 year ago, not currently on thinners. Pt states pain today is similar. Denies any sob, dizziness or cp

## 2023-12-11 NOTE — ED Provider Notes (Signed)
 [facility] DEPART[facility] AT [facility] Provider Note   CSN: [id number] Arrival date & time: 12/11/23  1850     History  Chief Complaint  Patient presents with   Abdominal Pain    Kathryn Compton is a 73 y.o. female.  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  Says that since 6 PM she has been having a bandlike abdominal pain across her mid abdomen.  Comes in waves and is currently 5/10 in severity but will oftentimes be 9/10 in severity.  Describes it as sharp.  No exacerbating or alleviating factors including eating.  Says that she had a portal vein thrombus before and this feels identical.  Was on Eliquis  until this summer when the thrombus had resolved.  Had a gastric sleeve procedure.        Home Medications Prior to Admission medications   Medication Sig Start Date End Date Taking? Authorizing Provider  citalopram  (CELEXA ) 40 MG tablet Take 40 mg by mouth daily. 01/11/22  Yes [provider]  diphenhydrAMINE  HCl, Sleep, (ZZZQUIL) 50 MG/30ML LIQD Take 5 mLs by mouth daily as needed (sleep).   Yes [provider]  Erenumab -aooe (AIMOVIG ) 140 MG/ML SOAJ Inject 140 mg into the skin every 30 (thirty) days. 02/06/23  Yes Onita Duos, MD  Homeopathic Products [facility] COLD REMEDY NA) Place 1 Swab into the nose daily as needed (sore throat).   Yes [provider]  HYDROcodone -acetaminophen  (NORCO/VICODIN) 5-325 MG tablet Take 1 tablet by mouth every 4 (four) hours as needed. 12/12/23  Yes Yolande Lamar BROCKS, MD  hydrOXYzine  (ATARAX ) 25 MG tablet Take 6.25-12.5 mg by mouth every 6 (six) hours as needed for anxiety (or sleep).   Yes [provider]  magnesium  hydroxide (MILK OF MAGNESIA) 400 MG/5ML suspension Take 30 mLs by mouth daily as needed for mild constipation.   Yes [provider]  meclizine (ANTIVERT) 25 MG tablet Take 25 mg by mouth 3  (three) times daily as needed for dizziness (vertigo).   Yes [provider]  Multiple Vitamins-Minerals (BARIATRIC FUSION) CHEW Chew 1 tablet by mouth daily.   Yes [provider]  pantoprazole  (PROTONIX ) 40 MG tablet Take 1 tablet (40 mg total) by mouth daily. Patient taking differently: Take 40 mg by mouth daily as needed (heartburn, ingestion). 11/26/22  Yes Drusilla Sabas RAMAN, MD  propranolol  (INDERAL ) 40 MG tablet TAKE 1 TABLET BY MOUTH TWICE  DAILY 07/16/23  Yes Branch, Dorn FALCON, MD  sucralfate (CARAFATE) 1 g tablet Take 1 g by mouth 2 (two) times daily. 11/06/23 11/05/24 Yes [provider]  XENICAL 120 MG capsule Take 120 mg by mouth 3 (three) times daily.   Yes [provider]      Allergies    Bee pollen, Dust mite extract, Microplegia msa-msg [plegisol], Topamax  [topiramate ], and Other    Review of Systems   Review of Systems  Physical Exam Updated Vital Signs BP (!) 112/56   Pulse (!) 54   Temp 98.9 F (37.2 C) (Oral)   Resp 18   Wt 108.9 kg   SpO2 94%   BMI 37.59 kg/m  Physical Exam Vitals and nursing note reviewed.  Constitutional:      General: She is not in acute distress.    Appearance: She is well-developed.  HENT:     Head: Normocephalic and atraumatic.     Right Ear: External ear normal.  Left Ear: External ear normal.     Nose: Nose normal.  Eyes:     Extraocular Movements: Extraocular movements intact.     Conjunctiva/sclera: Conjunctivae normal.     Pupils: Pupils are equal, round, and reactive to light.  Pulmonary:     Effort: Pulmonary effort is normal. No respiratory distress.  Abdominal:     General: Abdomen is flat. There is no distension.     Palpations: Abdomen is soft. There is no mass.     Tenderness: There is abdominal tenderness (Left-sided, periumbilical, right mid abdomen). There is no guarding.  Musculoskeletal:     Cervical back: Normal range of motion and neck supple.  Skin:    General: Skin is  warm and dry.  Neurological:     Mental Status: She is alert and oriented to person, place, and time. Mental status is at baseline.  Psychiatric:        Mood and Affect: Mood normal.     ED Results / Procedures / Treatments   Labs (all labs ordered are listed, but only abnormal results are displayed) Labs Reviewed  COMPREHENSIVE METABOLIC PANEL - Abnormal; Notable for the following components:      Result Value   Glucose, Bld 130 (*)    All other components within normal limits  CBC - Abnormal; Notable for the following components:   WBC 11.1 (*)    RDW 15.7 (*)    All other components within normal limits  LIPASE, BLOOD  URINALYSIS, ROUTINE W REFLEX MICROSCOPIC    EKG EKG Interpretation Date/Time:  Thursday December 11 2023 19:51:00 EST Ventricular Rate:  70 PR Interval:  138 QRS Duration:  82 QT Interval:  410 QTC Calculation: 442 R Axis:   120  Text Interpretation: Normal sinus rhythm Lateral infarct (cited on or before 11-Dec-2023) Abnormal ECG Confirmed by Yolande Charleston [phone]) on 12/11/2023 10:56:31 PM  Radiology CT Angio Abd/Pel W and/or Wo Contrast Result Date: 12/11/2023 CLINICAL DATA:  History of portal vein and SMV thrombosis. Abdominal pain. EXAM: CTA ABDOMEN AND PELVIS WITHOUT AND WITH CONTRAST TECHNIQUE: Multidetector CT imaging of the abdomen and pelvis was performed using the standard protocol during bolus administration of intravenous contrast. Multiplanar reconstructed images and MIPs were obtained and reviewed to evaluate the vascular anatomy. RADIATION DOSE REDUCTION: This exam was performed according to the departmental dose-optimization program which includes automated exposure control, adjustment of the mA and/or kV according to patient size and/or use of iterative reconstruction technique. CONTRAST:  OMNIPAQUE  IOHEXOL  350 MG/ML SOLN COMPARISON:  04/10/2023 FINDINGS: VASCULAR Aorta: Normal caliber aorta without aneurysm, dissection, vasculitis or  significant stenosis. Few scattered infrarenal calcifications. Celiac: Patent without evidence of aneurysm, dissection, vasculitis or significant stenosis. SMA: Patent without evidence of aneurysm, dissection, vasculitis or significant stenosis. Renals: Both renal arteries are patent without evidence of aneurysm, dissection, vasculitis, fibromuscular dysplasia or significant stenosis. IMA: Patent without evidence of aneurysm, dissection, vasculitis or significant stenosis. Inflow: Patent without evidence of aneurysm, dissection, vasculitis or significant stenosis. Proximal Outflow: Bilateral common femoral and visualized portions of the superficial and profunda femoral arteries are patent without evidence of aneurysm, dissection, vasculitis or significant stenosis. Veins: No obvious venous abnormality within the limitations of this arterial phase study. Portal venous imaging demonstrates patent portal vein, SMV and splenic vein. Review of the MIP images confirms the above findings. NON-VASCULAR Lower chest: No acute findings Hepatobiliary: No focal hepatic abnormality. Gallbladder unremarkable. Pancreas: No focal abnormality or ductal dilatation. Spleen: No focal abnormality.  Normal size. Adrenals/Urinary Tract: No adrenal abnormality. No focal renal abnormality. No stones or hydronephrosis. Urinary bladder is unremarkable. Stomach/Bowel: Stomach, large and small bowel grossly unremarkable. Postoperative changes in the stomach from sleeve. Lymphatic: No adenopathy Reproductive: Uterus and adnexa unremarkable.  No mass. Other: No free fluid or free air. Musculoskeletal: No acute bony abnormality IMPRESSION: VASCULAR Distal aortic atherosclerosis.  No aneurysm or dissection. Portal vein and SMV patent. NON-VASCULAR No acute findings in the abdomen or pelvis. Electronically Signed   By: Franky Crease M.D.   On: 12/11/2023 22:46    Procedures Procedures    Medications Ordered in ED Medications  HYDROmorphone   (DILAUDID ) injection 0.5 mg (0.5 mg Intravenous Given 12/11/23 2142)  iohexol  (OMNIPAQUE ) 350 MG/ML injection 100 mL (100 mLs Intravenous Contrast Given 12/11/23 2213)    ED Course/ Medical Decision Making/ A&P                                 Medical Decision Making Amount and/or Complexity of Data Reviewed Labs: ordered. Radiology: ordered.  Risk Prescription drug management.   Kathryn Compton is a 73 y.o. female with comorbidities that complicate the patient evaluation including portal vein thrombus and SMV thrombus who presents emergency department with abdominal pain.    Initial Ddx:  Portal vein thrombus, SMV thrombus, pancreatitis, gastritis, cholecystitis, appendectomy, gastric sleeve complication  MDM/Course:  Patient presents emergency department with periumbilical abdominal pain.  Has been coming in waves.  She says that this feels identical to when she was diagnosed with a portal vein thrombus.  Also has a history of SMV thrombus.  On exam has abdominal tenderness to palpation across the mid portion of her abdomen but no right upper quadrant or right lower quadrant pain.  Had a CTA to assess for portal vein thrombus and/or SMV thrombus which was negative.  It did not show other acute pathology.  She was given some Dilaudid  and upon re-evaluation reported that the pain had resolved.  Unclear exactly what is causing her pain at this time.  Given short course of hydrocodone  to take at home should it recur.  Return precautions discussed prior to discharge.  This patient presents to the ED for concern of complaints listed in HPI, this involves an extensive number of treatment options, and is a complaint that carries with it a high risk of complications and morbidity. Disposition including potential need for admission considered.   Dispo: DC Home. Return precautions discussed including, but not limited to, those listed in the AVS. Allowed pt time to ask questions which were answered  fully prior to dc.  Additional history obtained from significant other Records reviewed Outpatient Clinic Notes The following labs were independently interpreted: Chemistry and show no acute abnormality I personally reviewed and interpreted cardiac monitoring: normal sinus rhythm  I personally reviewed and interpreted the pt's EKG: see above for interpretation  I have reviewed the patients home medications and made adjustments as needed Social Determinants of health:  Elderly  Portions of this note were generated with Scientist, clinical (histocompatibility and immunogenetics). Dictation errors may occur despite best attempts at proofreading.     Final Clinical Impression(s) / ED Diagnoses Final diagnoses:  Periumbilical abdominal pain    Rx / DC Orders ED Discharge Orders          Ordered    HYDROcodone -acetaminophen  (NORCO/VICODIN) 5-325 MG tablet  Every 4 hours PRN        12/12/23 0018  Yolande Lamar BROCKS, MD 12/12/23 2010

## 2023-12-12 DIAGNOSIS — R1033 Periumbilical pain: Secondary | ICD-10-CM | POA: Diagnosis not present

## 2023-12-12 LAB — URINALYSIS, ROUTINE W REFLEX MICROSCOPIC
Bilirubin Urine: NEGATIVE
Glucose, UA: NEGATIVE mg/dL
Hgb urine dipstick: NEGATIVE
Ketones, ur: NEGATIVE mg/dL
Leukocytes,Ua: NEGATIVE
Nitrite: NEGATIVE
Protein, ur: NEGATIVE mg/dL
Specific Gravity, Urine: 1.01 (ref 1.005–1.030)
pH: 5 (ref 5.0–8.0)

## 2023-12-12 MED ORDER — HYDROCODONE-ACETAMINOPHEN 5-325 MG PO TABS: 1.0000 | ORAL_TABLET | ORAL | 0 refills | Status: AC | PRN

## 2023-12-12 NOTE — ED Provider Notes (Signed)
 Care assumed from Dr. Jarold Motto, patient with abdominal pain and negative workup pending urinalysis.  I have reviewed early urinalysis, my interpretation is normal urinalysis.  I am discharging the patient.   Dione Booze, MD 12/12/23 [phone]

## 2023-12-12 NOTE — Discharge Instructions (Signed)
 You were seen for your abdominal pain in the emergency department.   At home, please take Tylenol  for your pain. You may also take the norco we have prescribed you for any breakthrough pain that may have.  Do not take this before driving or operating heavy machinery.  Do not take this medication with alcohol .    Check your MyChart online for the results of any tests that had not resulted by the time you left the emergency department.   Follow-up with your primary doctor in 2-3 days regarding your visit.    Return immediately to the emergency department if you experience any of the following: Worsening pain, or any other concerning symptoms.    Thank you for visiting our Emergency Department. It was a pleasure taking care of you today.

## 2023-12-18 ENCOUNTER — Telehealth: Payer: Self-pay

## 2023-12-18 NOTE — Progress Notes (Signed)
Transition Care Management Follow-up Telephone Call Date of discharge and from where: 12/12/2023 [facility] How have you been since you were released from the hospital? Patient stated she is feeling much better and has not experienced any further pain. Any questions or concerns? No  Items Reviewed: Did the pt receive and understand the discharge instructions provided? Yes  Medications obtained and verified? Yes  Other? No  Any new allergies since your discharge? No  Dietary orders reviewed? Yes Do you have support at home? Yes   Follow up appointments reviewed:  PCP Hospital f/u appt confirmed? No  Scheduled to see  on  @ . Specialist Hospital f/u appt confirmed? No  Scheduled to see  on  @ . Are transportation arrangements needed? No  If their condition worsens, is the pt aware to call PCP or go to the Emergency Dept.? Yes Was the patient provided with contact information for the PCP's office or ED? Yes Was to pt encouraged to call back with questions or concerns? Yes   Amauri Keefe [facility]  [facility] Guide Direct Dial: [phone]  Fax: [phone] Website: [email or web address]

## 2024-01-08 NOTE — BH Specialist Note (Signed)
Integrated Behavioral Health via Telemedicine Visit  01/20/2024 Kathryn Compton [id number]  Number of Integrated Behavioral Health Clinician visits: Additional Visit  Session Start time: 1110   Session End time: 1150  Total time in minutes: 40  Referring Provider: Tinnie Gens, MD Kathryn/Family location: Home [facility] Provider location: Center for [facility] at [facility] for Women  All persons participating in visit: Kathryn Compton and Kathryn Compton   Types of Service: Individual psychotherapy and Telephone visit  I connected with Kathryn Compton and/or Kathryn Compton's  n/a  via  Telephone or Video Enabled Telemedicine Application  (Video is Caregility application) and verified that I am speaking with the correct person using two identifiers. Discussed confidentiality: Yes   I discussed the limitations of telemedicine and the availability of in person appointments.  Discussed there is a possibility of technology failure and discussed alternative modes of communication if that failure occurs.  I discussed that engaging in this telemedicine visit, they consent to the provision of behavioral healthcare and the services will be billed under their insurance.  Kathryn and/or legal guardian expressed understanding and consented to Telemedicine visit: Yes   Presenting Concerns: Kathryn and/or family reports the following symptoms/concerns: Processing life-changing turn of events the past two months (reunited with son and his family in person; accepting offer to move close to son in [place]), along with some (manageable) conflict between boyfriend and son.  Duration of problem: Ongoing; Severity of problem: mild  Kathryn and/or Family's Strengths/Protective Factors: Social connections, Concrete supports in place (healthy food, safe environments, etc.), Sense of purpose, and Physical Health (exercise, healthy diet, medication compliance, etc.)  Goals  Addressed: Kathryn will:  Maintain reduced symptoms of: anxiety and depression   Increase knowledge and/or ability of: stress reduction   Demonstrate ability to: Increase healthy adjustment to current life circumstances and Increase motivation to adhere to plan of care  Progress towards Goals: Ongoing  Interventions: Interventions utilized:  Supportive Reflection Standardized Assessments completed: Not Needed  Kathryn and/or Family Response: Kathryn agrees with treatment plan.   Assessment: Kathryn currently experiencing Generalized anxiety disorder; Psychosocial stress.   Kathryn may benefit from continued therapeutic intervention  .  Plan: Follow up with behavioral health clinician on : Two weeks Behavioral recommendations:  -Continue plan to begin downsizing, as first step towards move -Continue daily healthy self-care routines at home -Continue routine communication with son, daughter-in-law and grandchildren Referral(s): Integrated [employer] (In Clinic)  I discussed the assessment and treatment plan with the Kathryn and/or parent/guardian. They were provided an opportunity to ask questions and all were answered. They agreed with the plan and demonstrated an understanding of the instructions.   They were advised to call back or seek an in-person evaluation if the symptoms worsen or if the condition fails to improve as anticipated.  Rae Lips, LCSW     06/18/2023   11:04 AM 04/19/2022    1:47 PM 02/12/2022   11:22 AM 01/15/2022   10:55 AM 09/28/2021   10:25 AM  Depression screen PHQ 2/9  Decreased Interest 2 0 1 0 1  Down, Depressed, Hopeless 3 0 1 0 3  PHQ - 2 Score 5 0 2 0 4  Altered sleeping 0  0 0 3  Tired, decreased energy 3  1 1 3   Change in appetite 3  0 0 1  Feeling bad or failure about yourself  3  3 3 3   Trouble concentrating 0  3 3 0  Moving  slowly or fidgety/restless 0  0 0 0  Suicidal thoughts 0  0 0 1  PHQ-9 Score 14  9 7 15        06/18/2023   11:07 AM 02/12/2022   11:24 AM 01/15/2022   10:58 AM 09/28/2021   10:23 AM  GAD 7 : Generalized Anxiety Score  Nervous, Anxious, on Edge 0 1 1 3   Control/stop worrying 0 0 0 0  Worry too much - different things 3 2 0 2  Trouble relaxing 0 0 0 3  Restless 2 3 3 3   Easily annoyed or irritable 2 0 1 1  Afraid - awful might happen 2 0 0 0  Total GAD 7 Score 9 6 5  12

## 2024-01-09 DIAGNOSIS — I10 Essential (primary) hypertension: Secondary | ICD-10-CM | POA: Diagnosis not present

## 2024-01-20 ENCOUNTER — Ambulatory Visit: Payer: 59 | Admitting: Clinical

## 2024-01-20 DIAGNOSIS — F411 Generalized anxiety disorder: Secondary | ICD-10-CM | POA: Diagnosis not present

## 2024-01-20 DIAGNOSIS — Z658 Other specified problems related to psychosocial circumstances: Secondary | ICD-10-CM
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
## 2024-02-04 ENCOUNTER — Other Ambulatory Visit (HOSPITAL_COMMUNITY): Payer: Self-pay | Admitting: Gerontology

## 2024-02-04 ENCOUNTER — Ambulatory Visit: Payer: 59 | Admitting: Clinical

## 2024-02-04 ENCOUNTER — Ambulatory Visit (HOSPITAL_COMMUNITY)
Admission: RE | Admit: 2024-02-04 | Discharge: 2024-02-04 | Disposition: A | Discharge status: Home / Self Care | Source: Ambulatory Visit | Attending: Gerontology | Admitting: Gerontology

## 2024-02-04 ENCOUNTER — Other Ambulatory Visit (HOSPITAL_COMMUNITY)
Admission: RE | Admit: 2024-02-04 | Discharge: 2024-02-04 | Disposition: A | Discharge status: Home / Self Care | Source: Ambulatory Visit | Attending: Internal Medicine | Admitting: Internal Medicine

## 2024-02-04 DIAGNOSIS — M25531 Pain in right wrist: Secondary | ICD-10-CM

## 2024-02-04 DIAGNOSIS — M79631 Pain in right forearm: Secondary | ICD-10-CM | POA: Diagnosis not present

## 2024-02-04 DIAGNOSIS — M19031 Primary osteoarthritis, right wrist: Secondary | ICD-10-CM | POA: Diagnosis not present

## 2024-02-04 DIAGNOSIS — M778 Other enthesopathies, not elsewhere classified: Secondary | ICD-10-CM | POA: Diagnosis not present

## 2024-02-04 DIAGNOSIS — F411 Generalized anxiety disorder: Secondary | ICD-10-CM | POA: Diagnosis not present

## 2024-02-04 DIAGNOSIS — I10 Essential (primary) hypertension: Secondary | ICD-10-CM | POA: Diagnosis not present

## 2024-02-04 DIAGNOSIS — M79641 Pain in right hand: Secondary | ICD-10-CM | POA: Diagnosis not present

## 2024-02-04 DIAGNOSIS — Z658 Other specified problems related to psychosocial circumstances: Secondary | ICD-10-CM

## 2024-02-04 LAB — URIC ACID: Uric Acid, Serum: 4 mg/dL (ref 2.5–7.1)

## 2024-02-13 NOTE — BH Specialist Note (Unsigned)
 Integrated Behavioral Health via Telemedicine Visit  02/26/2024 [name] [id number]  Number of Integrated Behavioral Health Clinician visits: Additional Visit  Session Start time: 1102   Session End time: 1124  Total time in minutes: 22   Referring Provider: Tinnie Gens, MD Patient/Family location: [facility] Provider location: Center for [facility] Healthcare at [facility] for Women  All persons participating in visit: [name] and [facility] Jakim Drapeau   Types of Service: Individual psychotherapy and Telephone visit  I connected with [name] and/or Vernona Rieger Fraticelli's  n/a  via  Telephone or Video Enabled Telemedicine Application  (Video is Caregility application) and verified that I am speaking with the correct person using two identifiers. Discussed confidentiality: Yes   I discussed the limitations of telemedicine and the availability of in person appointments.  Discussed there is a possibility of technology failure and discussed alternative modes of communication if that failure occurs.  I discussed that engaging in this telemedicine visit, they consent to the provision of behavioral healthcare and the services will be billed under their insurance.  Patient and/or legal guardian expressed understanding and consented to Telemedicine visit: Yes   Presenting Concerns: Patient and/or family reports the following symptoms/concerns: Processing positive opportunity to move to [place] to be near her son and grandchildren; stress of downsizing and preparing to move by 04/01/24.  Duration of problem: Recent; Severity of problem: mild  Patient and/or Family's Strengths/Protective Factors: Social connections, Social and Emotional competence, Concrete supports in place (healthy food, safe environments, etc.), Sense of purpose, and Physical Health (exercise, healthy diet, medication compliance, etc.)  Goals Addressed: Patient will:  Reduce symptoms  of: anxiety, depression, and stress   Increase knowledge and/or ability of: stress reduction   Demonstrate ability to: Increase healthy adjustment to current life circumstances  Progress towards Goals: Ongoing  Interventions: Interventions utilized:  Supportive Reflection Standardized Assessments completed: Not Needed  Patient and/or Family Response: Patient agrees with treatment plan.   Assessment: Patient currently experiencing Generalized anxiety disorder; Psychosocial stress.   Patient may benefit from continued therapeutic intervention  .  Plan: Follow up with behavioral health clinician on : Three weeks Behavioral recommendations:  -Continue plan to put items for sale on [employer]; have big yard sale -Continue prioritizing maintaining healthy habits during this time of transition (meals, exercise, sleep; communication with positive people in life) Referral(s): Integrated [employer] (In Clinic)  I discussed the assessment and treatment plan with the patient and/or parent/guardian. They were provided an opportunity to ask questions and all were answered. They agreed with the plan and demonstrated an understanding of the instructions.   They were advised to call back or seek an in-person evaluation if the symptoms worsen or if the condition fails to improve as anticipated.  Rae Lips, LCSW     06/18/2023   11:04 AM 04/19/2022    1:47 PM 02/12/2022   11:22 AM 01/15/2022   10:55 AM 09/28/2021   10:25 AM  Depression screen PHQ 2/9  Decreased Interest 2 0 1 0 1  Down, Depressed, Hopeless 3 0 1 0 3  PHQ - 2 Score 5 0 2 0 4  Altered sleeping 0  0 0 3  Tired, decreased energy 3  1 1 3   Change in appetite 3  0 0 1  Feeling bad or failure about yourself  3  3 3 3   Trouble concentrating 0  3 3 0  Moving slowly or fidgety/restless 0  0 0 0  Suicidal thoughts 0  0 0 1  PHQ-9 Score 14  9 7 15       06/18/2023   11:07 AM 02/12/2022   11:24 AM  01/15/2022   10:58 AM 09/28/2021   10:23 AM  GAD 7 : Generalized Anxiety Score  Nervous, Anxious, on Edge 0 1 1 3   Control/stop worrying 0 0 0 0  Worry too much - different things 3 2 0 2  Trouble relaxing 0 0 0 3  Restless 2 3 3 3   Easily annoyed or irritable 2 0 1 1  Afraid - awful might happen 2 0 0 0  Total GAD 7 Score 9 6 5  12

## 2024-02-24 DIAGNOSIS — M1731 Unilateral post-traumatic osteoarthritis, right knee: Secondary | ICD-10-CM | POA: Diagnosis not present

## 2024-02-25 ENCOUNTER — Ambulatory Visit (INDEPENDENT_AMBULATORY_CARE_PROVIDER_SITE_OTHER): Admitting: Clinical

## 2024-02-25 DIAGNOSIS — F411 Generalized anxiety disorder: Secondary | ICD-10-CM

## 2024-02-25 DIAGNOSIS — Z658 Other specified problems related to psychosocial circumstances: Secondary | ICD-10-CM

## 2024-03-03 ENCOUNTER — Ambulatory Visit: Admitting: Obstetrics & Gynecology

## 2024-03-03 ENCOUNTER — Encounter: Payer: Self-pay | Admitting: *Deleted

## 2024-03-03 ENCOUNTER — Other Ambulatory Visit (HOSPITAL_COMMUNITY)
Admission: RE | Admit: 2024-03-03 | Discharge: 2024-03-03 | Disposition: A | Discharge status: Home / Self Care | Source: Ambulatory Visit | Attending: Obstetrics & Gynecology | Admitting: Obstetrics & Gynecology

## 2024-03-03 ENCOUNTER — Encounter: Payer: Self-pay | Admitting: Obstetrics & Gynecology

## 2024-03-03 VITALS — BP 130/75 | HR 76 | Ht 67.0 in | Wt 244.0 lb

## 2024-03-03 DIAGNOSIS — L309 Dermatitis, unspecified: Secondary | ICD-10-CM

## 2024-03-03 DIAGNOSIS — N898 Other specified noninflammatory disorders of vagina: Secondary | ICD-10-CM | POA: Diagnosis not present

## 2024-03-03 DIAGNOSIS — Z86718 Personal history of other venous thrombosis and embolism: Secondary | ICD-10-CM | POA: Diagnosis not present

## 2024-03-03 DIAGNOSIS — N951 Menopausal and female climacteric states: Secondary | ICD-10-CM | POA: Diagnosis not present

## 2024-03-03 MED ORDER — TRIAMCINOLONE ACETONIDE 0.5 % EX OINT: TOPICAL_OINTMENT | CUTANEOUS | 2 refills | Status: AC

## 2024-03-03 MED ORDER — VEOZAH 45 MG PO TABS: 1.0000 | ORAL_TABLET | Freq: Every day | ORAL | 11 refills | Status: DC

## 2024-03-03 NOTE — Progress Notes (Signed)
 GYN VISIT Patient name: Kathryn Compton MRN [id number]  Date of birth: 07-May-1951 Chief Complaint:   Follow-up  History of Present Illness:   Kathryn Compton is a 73 y.o. [id number] PM female being seen today for the following concerns:  -Vasomotor symptoms: Notes considerable hot flashes, rates her symptoms 10/10.  Reports that she is absolutely miserable.  Pt already on SSRI with minimal improvement.  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  Previously tried to send in veozah, but was not covered by insurance.  She wishes to have the medication resent in  -Vaginal irritation: This has been an ongoing issue that has gotten worse in the past few weeks.  Notes vaginal itching both externally and on her groin area.  She denies vaginal discharge or odor.  She notes that she has had a yeast infection and will use Monistat in the past, but does not feel like she has 1 currently  No LMP recorded. Patient is postmenopausal.    Review of Systems:   Pertinent items are noted in HPI Denies fever/chills, dizziness, headaches, visual disturbances, fatigue, shortness of breath, chest pain, abdominal pain, vomiting. Pertinent History Reviewed:   Past Surgical History:  Procedure Laterality Date   BREAST REDUCTION SURGERY     COLONOSCOPY N/A 09/06/2019   Procedure: COLONOSCOPY;  Surgeon: West Bali, MD;  Location: [facility];  Service: Endoscopy;  Laterality: N/A;  2:00   LAPAROSCOPIC GASTRIC SLEEVE RESECTION N/A 11/05/2022   Procedure: LAPAROSCOPIC SLEEVE GASTRECTOMY;  Surgeon: Sheliah Hatch De Blanch, MD;  Location: [facility] ORS;  Service: General;  Laterality: N/A;   POLYPECTOMY  09/06/2019   Procedure: POLYPECTOMY;  Surgeon: West Bali, MD;  Location: [facility];  Service: Endoscopy;;   REDUCTION MAMMAPLASTY     TUBAL LIGATION     UPPER GI ENDOSCOPY N/A 11/05/2022   Procedure: UPPER GI ENDOSCOPY;  Surgeon: Sheliah Hatch De Blanch, MD;  Location: [facility];  Service: General;   Laterality: N/A;    Past Medical History:  Diagnosis Date   Anxiety    Arthritis    Depression    GERD (gastroesophageal reflux disease)    Headache    Hypertension    Motion sickness    Palpitations    Plantar fasciitis    Seizures (HCC)    Vertigo    Reviewed problem list, medications and allergies. Physical Assessment:   Vitals:   03/03/24 1412  BP: 130/75  Pulse: 76  Weight: 244 lb (110.7 kg)  Height: 5\' 7"  (1.702 m)  Body mass index is 38.22 kg/m.       Physical Examination:   General appearance: alert, well appearing, and in no distress  Psych: mood appropriate, normal affect  Skin: warm & dry   Cardiovascular: normal heart rate noted  Respiratory: normal respiratory effort, no distress  Abdomen: obese, soft, non-tender   Pelvic: minimal pink irregular rash noted bilateral groin VULVA: normal appearing labia.  At perineum/introitus mostly on right side- small area ~ 1cm in sized with raised hyperkeratosis/white appearing changes.  No discrete mass noted. VAGINA: normal appearing vagina with normal color and discharge, no lesions, CERVIX: normal appearing cervix without discharge or lesions  Extremities: no calf tenderness bilaterally   Chaperone: Faith Rogue    Assessment & Plan:  1) Vasomotor symptoms -Will again try to send in [facility], patient given 2-week samples given along with coupon card -discussed plan for LFTs in 3mos  2) Vulvar irritation/dermatitis -Vaginal swab obtained to rule out underlying infection -  Based on findings on exam concern for vulvar dermatitis -Rx for triamcinolone sent in for patient to use sparingly -Recommend follow-up in 3 months, if no improvement would consider vulvar biopsy  Meds ordered this encounter  Medications   Fezolinetant (VEOZAH) 45 MG TABS    Sig: Take 1 tablet (45 mg total) by mouth daily.    Dispense:  30 tablet    Refill:  11   triamcinolone ointment (KENALOG) 0.5 %    Sig: Pea-sized amount to affected  area up to twice daily as needed    Dispense:  15 g    Refill:  2      Return in about 3 months (around 06/02/2024) for medication follow up.   Myna Hidalgo, DO Attending Obstetrician & Gynecologist, [facility] for [employer], [facility]

## 2024-03-05 ENCOUNTER — Encounter: Payer: Self-pay | Admitting: Obstetrics & Gynecology

## 2024-03-05 LAB — CERVICOVAGINAL ANCILLARY ONLY
Bacterial Vaginitis (gardnerella): NEGATIVE
Candida Glabrata: NEGATIVE
Candida Vaginitis: NEGATIVE
Comment: NEGATIVE
Comment: NEGATIVE
Comment: NEGATIVE

## 2024-03-06 DIAGNOSIS — I10 Essential (primary) hypertension: Secondary | ICD-10-CM | POA: Diagnosis not present
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
## 2024-03-22 ENCOUNTER — Ambulatory Visit: Admitting: Clinical

## 2024-03-22 DIAGNOSIS — Z658 Other specified problems related to psychosocial circumstances: Secondary | ICD-10-CM

## 2024-03-22 DIAGNOSIS — F411 Generalized anxiety disorder: Secondary | ICD-10-CM

## 2024-03-25 DIAGNOSIS — M5451 Vertebrogenic low back pain: Secondary | ICD-10-CM | POA: Diagnosis not present

## 2024-03-25 DIAGNOSIS — G4700 Insomnia, unspecified: Secondary | ICD-10-CM | POA: Diagnosis not present

## 2024-03-25 DIAGNOSIS — I10 Essential (primary) hypertension: Secondary | ICD-10-CM | POA: Diagnosis not present

## 2024-03-25 DIAGNOSIS — M170 Bilateral primary osteoarthritis of knee: Secondary | ICD-10-CM | POA: Diagnosis not present

## 2024-03-26 DIAGNOSIS — M9901 Segmental and somatic dysfunction of cervical region: Secondary | ICD-10-CM | POA: Diagnosis not present

## 2024-03-26 DIAGNOSIS — M546 Pain in thoracic spine: Secondary | ICD-10-CM | POA: Diagnosis not present

## 2024-03-26 DIAGNOSIS — M62830 Muscle spasm of back: Secondary | ICD-10-CM | POA: Diagnosis not present

## 2024-03-26 DIAGNOSIS — M9903 Segmental and somatic dysfunction of lumbar region: Secondary | ICD-10-CM | POA: Diagnosis not present

## 2024-03-26 DIAGNOSIS — M9902 Segmental and somatic dysfunction of thoracic region: Secondary | ICD-10-CM | POA: Diagnosis not present

## 2024-03-26 DIAGNOSIS — M542 Cervicalgia: Secondary | ICD-10-CM | POA: Diagnosis not present

## 2024-03-30 ENCOUNTER — Other Ambulatory Visit: Payer: Self-pay | Admitting: *Deleted

## 2024-03-30 DIAGNOSIS — N951 Menopausal and female climacteric states: Secondary | ICD-10-CM

## 2024-03-30 MED ORDER — VEOZAH 45 MG PO TABS
1.0000 | ORAL_TABLET | Freq: Every day | ORAL | 11 refills | Status: AC
Start: 2024-03-30 — End: ?

## 2024-04-11 DIAGNOSIS — R519 Headache, unspecified: Secondary | ICD-10-CM | POA: Diagnosis not present

## 2024-04-11 DIAGNOSIS — R42 Dizziness and giddiness: Secondary | ICD-10-CM | POA: Diagnosis not present

## 2024-04-11 DIAGNOSIS — Z79899 Other long term (current) drug therapy: Secondary | ICD-10-CM | POA: Diagnosis not present

## 2024-04-11 DIAGNOSIS — M1990 Unspecified osteoarthritis, unspecified site: Secondary | ICD-10-CM | POA: Diagnosis not present

## 2024-07-23 ENCOUNTER — Encounter: Payer: Self-pay | Admitting: Radiology

## 2024-08-10 ENCOUNTER — Telehealth: Payer: Self-pay | Admitting: *Deleted

## 2024-08-10 ENCOUNTER — Telehealth: Payer: Self-pay | Admitting: Neurology

## 2024-08-10 NOTE — Telephone Encounter (Signed)
 Patient requesting medical record due to have moved to [place]  and need medical record. Would like a call back to discuss how can  receive medical records.

## 2024-08-10 NOTE — Telephone Encounter (Signed)
 Pt medical records faxed on 08/10/2024 to [phone] Dr Juliann

## 2024-10-04 ENCOUNTER — Encounter: Payer: Self-pay | Admitting: Radiology
